# Patient Record
Sex: Male | Born: 1960 | Race: White | Hispanic: No | Marital: Married
Health system: Southern US, Academic
[De-identification: ages and names within clinical notes are randomized; demographics above are authoritative.]

## PROBLEM LIST (undated history)

## (undated) ENCOUNTER — Encounter

## (undated) ENCOUNTER — Telehealth

## (undated) ENCOUNTER — Encounter: Attending: Radiation Oncology | Primary: Radiation Oncology

## (undated) ENCOUNTER — Telehealth: Attending: Hematology & Oncology | Primary: Hematology & Oncology

## (undated) ENCOUNTER — Encounter: Attending: Adult Health | Primary: Adult Health

## (undated) ENCOUNTER — Telehealth: Attending: Family | Primary: Family

## (undated) ENCOUNTER — Ambulatory Visit

## (undated) ENCOUNTER — Ambulatory Visit: Payer: MEDICARE | Attending: Family | Primary: Family

## (undated) ENCOUNTER — Encounter: Attending: Hematology & Oncology | Primary: Hematology & Oncology

## (undated) ENCOUNTER — Ambulatory Visit: Payer: MEDICARE

## (undated) ENCOUNTER — Encounter: Attending: Family | Primary: Family

## (undated) ENCOUNTER — Ambulatory Visit: Attending: Radiation Oncology | Primary: Radiation Oncology

## (undated) ENCOUNTER — Telehealth
Attending: Student in an Organized Health Care Education/Training Program | Primary: Student in an Organized Health Care Education/Training Program

## (undated) ENCOUNTER — Encounter: Attending: "Oncology | Primary: "Oncology

## (undated) ENCOUNTER — Encounter: Attending: Physician Assistant | Primary: Physician Assistant

## (undated) ENCOUNTER — Ambulatory Visit: Payer: MEDICARE | Attending: Hematology & Oncology | Primary: Hematology & Oncology

## (undated) ENCOUNTER — Ambulatory Visit
Attending: Rehabilitative and Restorative Service Providers" | Primary: Rehabilitative and Restorative Service Providers"

## (undated) ENCOUNTER — Encounter: Attending: Speech-Language Pathologist | Primary: Speech-Language Pathologist

## (undated) ENCOUNTER — Ambulatory Visit: Payer: MEDICARE | Attending: Gastroenterology | Primary: Gastroenterology

## (undated) ENCOUNTER — Ambulatory Visit: Payer: MEDICARE | Attending: Physician Assistant | Primary: Physician Assistant

## (undated) ENCOUNTER — Encounter: Attending: Internal Medicine | Primary: Internal Medicine

## (undated) ENCOUNTER — Telehealth: Attending: Adult Health | Primary: Adult Health

## (undated) ENCOUNTER — Ambulatory Visit: Payer: MEDICARE | Attending: Internal Medicine | Primary: Internal Medicine

## (undated) ENCOUNTER — Institutional Professional Consult (permissible substitution): Payer: MEDICARE | Attending: Family | Primary: Family

## (undated) ENCOUNTER — Encounter: Payer: MEDICARE | Attending: Gastroenterology | Primary: Gastroenterology

## (undated) ENCOUNTER — Telehealth
Attending: Pharmacist Clinician (PhC)/ Clinical Pharmacy Specialist | Primary: Pharmacist Clinician (PhC)/ Clinical Pharmacy Specialist

## (undated) ENCOUNTER — Ambulatory Visit
Attending: Pharmacist Clinician (PhC)/ Clinical Pharmacy Specialist | Primary: Pharmacist Clinician (PhC)/ Clinical Pharmacy Specialist

## (undated) ENCOUNTER — Ambulatory Visit
Attending: Student in an Organized Health Care Education/Training Program | Primary: Student in an Organized Health Care Education/Training Program

## (undated) ENCOUNTER — Ambulatory Visit: Payer: MEDICARE | Attending: Radiation Oncology | Primary: Radiation Oncology

## (undated) ENCOUNTER — Telehealth: Attending: Physician Assistant | Primary: Physician Assistant

## (undated) ENCOUNTER — Encounter
Attending: Pharmacist Clinician (PhC)/ Clinical Pharmacy Specialist | Primary: Pharmacist Clinician (PhC)/ Clinical Pharmacy Specialist

## (undated) ENCOUNTER — Encounter
Attending: Student in an Organized Health Care Education/Training Program | Primary: Student in an Organized Health Care Education/Training Program

## (undated) ENCOUNTER — Encounter: Attending: Diagnostic Radiology | Primary: Diagnostic Radiology

## (undated) ENCOUNTER — Ambulatory Visit
Payer: MEDICARE | Attending: Student in an Organized Health Care Education/Training Program | Primary: Student in an Organized Health Care Education/Training Program

## (undated) ENCOUNTER — Telehealth: Attending: Geriatric Medicine | Primary: Geriatric Medicine

## (undated) ENCOUNTER — Ambulatory Visit: Payer: MEDICARE | Attending: Speech-Language Pathologist | Primary: Speech-Language Pathologist

## (undated) ENCOUNTER — Institutional Professional Consult (permissible substitution): Payer: MEDICARE

## (undated) DIAGNOSIS — F1021 Alcohol dependence, in remission: Secondary | ICD-10-CM

## (undated) DIAGNOSIS — N200 Calculus of kidney: Secondary | ICD-10-CM

## (undated) DIAGNOSIS — F419 Anxiety disorder, unspecified: Secondary | ICD-10-CM

## (undated) DIAGNOSIS — F32A Depression, unspecified: Secondary | ICD-10-CM

## (undated) DIAGNOSIS — C329 Malignant neoplasm of larynx, unspecified: Secondary | ICD-10-CM

## (undated) DIAGNOSIS — F329 Major depressive disorder, single episode, unspecified: Secondary | ICD-10-CM

## (undated) DIAGNOSIS — I10 Essential (primary) hypertension: Secondary | ICD-10-CM

## (undated) DIAGNOSIS — B1920 Unspecified viral hepatitis C without hepatic coma: Secondary | ICD-10-CM

## (undated) HISTORY — DX: Calculus of kidney: N20.0

## (undated) HISTORY — DX: Malignant neoplasm of larynx, unspecified: C32.9

## (undated) HISTORY — DX: Anxiety disorder, unspecified: F41.9

## (undated) HISTORY — DX: Major depressive disorder, single episode, unspecified: F32.9

## (undated) HISTORY — DX: Unspecified viral hepatitis C without hepatic coma: B19.20

## (undated) HISTORY — DX: Alcohol dependence, in remission: F10.21

## (undated) HISTORY — DX: Depression, unspecified: F32.A

## (undated) HISTORY — DX: Essential (primary) hypertension: I10

## (undated) MED ORDER — LISINOPRIL 10 MG TABLET: tablet | 0 refills | 0 days

## (undated) MED ORDER — OXYCODONE 10 MG TABLET: ORAL | 0.00000 days | PRN

---

## 1989-01-14 HISTORY — PX: SPINE SURGERY: SHX786

## 2017-04-05 ENCOUNTER — Encounter
Admit: 2017-04-05 | Discharge: 2017-04-11 | Disposition: A | Discharge status: Home Health | Payer: MEDICARE | Attending: Student in an Organized Health Care Education/Training Program

## 2017-04-05 ENCOUNTER — Ambulatory Visit: Admit: 2017-04-05 | Discharge: 2017-04-11 | Disposition: A | Discharge status: Home Health | Payer: MEDICARE

## 2017-04-05 DIAGNOSIS — R221 Localized swelling, mass and lump, neck: Principal | ICD-10-CM

## 2017-04-05 DIAGNOSIS — C329 Malignant neoplasm of larynx, unspecified: Secondary | ICD-10-CM

## 2017-04-05 HISTORY — DX: Malignant neoplasm of larynx, unspecified: C32.9

## 2017-04-06 DIAGNOSIS — R221 Localized swelling, mass and lump, neck: Principal | ICD-10-CM

## 2017-04-08 DIAGNOSIS — R221 Localized swelling, mass and lump, neck: Principal | ICD-10-CM

## 2017-04-11 MED ORDER — OXYCODONE 5 MG TABLET
ORAL_TABLET | ORAL | 0 refills | 0.00 days | Status: CP | PRN
Start: 2017-04-11 — End: 2017-04-14

## 2017-04-11 MED ORDER — PREGABALIN 50 MG CAPSULE: 50 mg | capsule | 0 refills | 0 days

## 2017-04-11 MED ORDER — DOCUSATE SODIUM 50 MG/5 ML ORAL LIQUID
Freq: Every day | ORAL | 0 refills | 0.00000 days | Status: SS | PRN
Start: 2017-04-11 — End: 2017-04-11

## 2017-04-11 MED ORDER — ACETAMINOPHEN 500 MG TABLET: 1000 mg | tablet | Freq: Four times a day (QID) | 2 refills | 0 days | Status: AC

## 2017-04-11 MED ORDER — DOCUSATE SODIUM 50 MG/5 ML ORAL LIQUID: 100 mg | mL | Freq: Every day | 0 refills | 0 days | Status: SS

## 2017-04-11 MED ORDER — ACETAMINOPHEN 500 MG TABLET
Freq: Four times a day (QID) | ORAL | 2 refills | 0.00000 days | Status: CP | PRN
Start: 2017-04-11 — End: 2017-04-11

## 2017-04-11 MED ORDER — PREGABALIN 50 MG CAPSULE
ORAL_CAPSULE | Freq: Two times a day (BID) | ORAL | 0 refills | 0.00000 days | Status: CP
Start: 2017-04-11 — End: 2017-04-11

## 2017-04-11 MED FILL — ACETAMINOPHEN/500MG/TABS: ACETAMINOPHEN/500MG/TABS | 12 days supply | Qty: 100 | Fill #0

## 2017-04-11 MED FILL — SILACE/10MG/ML/LIQD: SILACE/10MG/ML/LIQD | 47 days supply | Qty: 473 | Fill #0

## 2017-04-11 MED FILL — OXYCODONE HCL/5MG/TABS: OXYCODONE HCL/5MG/TABS | 2 days supply | Qty: 15 | Fill #0

## 2017-04-11 MED FILL — LYRICA/50MG/CAP: LYRICA/50MG/CAP | 4 days supply | Qty: 8 | Fill #0

## 2017-04-14 ENCOUNTER — Ambulatory Visit
Admit: 2017-04-14 | Discharge: 2017-05-13 | Payer: MEDICARE | Attending: Radiation Oncology | Primary: Radiation Oncology

## 2017-04-14 ENCOUNTER — Other Ambulatory Visit: Admit: 2017-04-14 | Discharge: 2017-05-13 | Payer: MEDICARE

## 2017-04-14 ENCOUNTER — Ambulatory Visit: Admit: 2017-04-14 | Discharge: 2017-05-13 | Payer: MEDICARE

## 2017-04-14 ENCOUNTER — Ambulatory Visit
Admit: 2017-04-14 | Discharge: 2017-05-13 | Payer: MEDICARE | Attending: Speech-Language Pathologist | Primary: Speech-Language Pathologist

## 2017-04-14 ENCOUNTER — Ambulatory Visit
Admit: 2017-04-14 | Discharge: 2017-05-13 | Payer: MEDICARE | Attending: Physician Assistant | Primary: Physician Assistant

## 2017-04-14 ENCOUNTER — Encounter: Admit: 2017-04-14 | Discharge: 2017-05-13 | Payer: MEDICARE | Attending: Adult Health | Primary: Adult Health

## 2017-04-14 ENCOUNTER — Ambulatory Visit
Admit: 2017-04-14 | Discharge: 2017-05-13 | Payer: MEDICARE | Attending: Hematology & Oncology | Primary: Hematology & Oncology

## 2017-04-14 ENCOUNTER — Ambulatory Visit: Admit: 2017-04-14 | Discharge: 2017-05-13 | Payer: MEDICARE | Attending: Registered" | Primary: Registered"

## 2017-04-14 ENCOUNTER — Ambulatory Visit: Admit: 2017-04-14 | Discharge: 2017-05-13 | Payer: MEDICARE | Attending: Family | Primary: Family

## 2017-04-14 DIAGNOSIS — C329 Malignant neoplasm of larynx, unspecified: Principal | ICD-10-CM

## 2017-04-14 DIAGNOSIS — G8929 Other chronic pain: Secondary | ICD-10-CM

## 2017-04-14 DIAGNOSIS — R911 Solitary pulmonary nodule: Secondary | ICD-10-CM

## 2017-04-14 MED ORDER — PREGABALIN 50 MG CAPSULE
ORAL_CAPSULE | Freq: Two times a day (BID) | ORAL | 3 refills | 0.00000 days | Status: CP
Start: 2017-04-14 — End: 2017-04-14

## 2017-04-14 MED ORDER — PREGABALIN 50 MG CAPSULE: 50 mg | capsule | 3 refills | 0 days

## 2017-04-14 MED ORDER — OXYCODONE 5 MG TABLET
ORAL_TABLET | Freq: Four times a day (QID) | ORAL | 0 refills | 0.00000 days | Status: CP | PRN
Start: 2017-04-14 — End: 2017-04-28

## 2017-04-14 MED FILL — OXYCODONE HCL/5MG/TABS: OXYCODONE HCL/5MG/TABS | 14 days supply | Qty: 56 | Fill #0

## 2017-04-15 DIAGNOSIS — C329 Malignant neoplasm of larynx, unspecified: Principal | ICD-10-CM

## 2017-04-23 DIAGNOSIS — C329 Malignant neoplasm of larynx, unspecified: Secondary | ICD-10-CM

## 2017-04-23 DIAGNOSIS — R1310 Dysphagia, unspecified: Principal | ICD-10-CM

## 2017-04-28 ENCOUNTER — Ambulatory Visit: Admit: 2017-04-28 | Discharge: 2017-04-29 | Attending: Radiation Oncology | Primary: Radiation Oncology

## 2017-04-28 DIAGNOSIS — G8929 Other chronic pain: Secondary | ICD-10-CM

## 2017-04-28 DIAGNOSIS — C329 Malignant neoplasm of larynx, unspecified: Principal | ICD-10-CM

## 2017-04-28 MED ORDER — OXYCODONE 5 MG TABLET
ORAL_TABLET | ORAL | 0 refills | 0 days | Status: CP | PRN
Start: 2017-04-28 — End: 2017-05-13

## 2017-04-29 ENCOUNTER — Ambulatory Visit: Admit: 2017-04-29 | Discharge: 2017-04-30 | Attending: Registered" | Primary: Registered"

## 2017-04-29 ENCOUNTER — Ambulatory Visit: Admit: 2017-04-29 | Discharge: 2017-04-30

## 2017-04-29 DIAGNOSIS — Z713 Dietary counseling and surveillance: Principal | ICD-10-CM

## 2017-04-29 DIAGNOSIS — F419 Anxiety disorder, unspecified: Secondary | ICD-10-CM

## 2017-04-29 DIAGNOSIS — Z006 Encounter for examination for normal comparison and control in clinical research program: Secondary | ICD-10-CM

## 2017-04-29 DIAGNOSIS — G893 Neoplasm related pain (acute) (chronic): Secondary | ICD-10-CM

## 2017-04-29 DIAGNOSIS — C329 Malignant neoplasm of larynx, unspecified: Principal | ICD-10-CM

## 2017-04-29 DIAGNOSIS — F329 Major depressive disorder, single episode, unspecified: Secondary | ICD-10-CM

## 2017-04-29 MED ORDER — GABAPENTIN 300 MG CAPSULE
ORAL_CAPSULE | Freq: Every evening | ORAL | 0 refills | 0.00000 days | Status: CP
Start: 2017-04-29 — End: 2017-05-22

## 2017-04-29 MED ORDER — ONDANSETRON HCL 8 MG TABLET
ORAL_TABLET | Freq: Three times a day (TID) | ORAL | 2 refills | 0.00000 days | Status: CP | PRN
Start: 2017-04-29 — End: 2017-05-26

## 2017-04-29 MED ORDER — PREGABALIN 50 MG CAPSULE: 50 mg | capsule | Freq: Two times a day (BID) | 1 refills | 0 days | Status: AC

## 2017-04-29 MED ORDER — PROCHLORPERAZINE MALEATE 10 MG TABLET
ORAL_TABLET | Freq: Four times a day (QID) | ORAL | 2 refills | 0.00000 days | Status: CP | PRN
Start: 2017-04-29 — End: 2017-04-29

## 2017-04-29 MED ORDER — (CLOSED) STUDY LCCC1430 AZD1775 100 MG CAPSULE
Freq: Two times a day (BID) | ORAL | 0 refills | 0 days | Status: CP
Start: 2017-04-29 — End: 2017-07-16

## 2017-04-29 MED ORDER — GABAPENTIN 300 MG CAPSULE: 300 mg | capsule | Freq: Every evening | 0 refills | 0 days | Status: AC

## 2017-04-29 MED ORDER — (CLOSED) STUDY LCCC1430 AZD1775 25 MG CAPSULE
Freq: Two times a day (BID) | ORAL | 0 refills | 0 days | Status: CP
Start: 2017-04-29 — End: 2017-07-16

## 2017-04-29 MED ORDER — PREGABALIN 50 MG CAPSULE
ORAL_CAPSULE | Freq: Two times a day (BID) | ORAL | 1 refills | 0.00000 days | Status: CP
Start: 2017-04-29 — End: 2017-04-29

## 2017-04-29 MED ORDER — ESCITALOPRAM 10 MG TABLET
ORAL_TABLET | Freq: Every day | ORAL | 2 refills | 0 days | Status: CP
Start: 2017-04-29 — End: 2017-05-20

## 2017-04-29 MED ORDER — CLONAZEPAM 0.5 MG TABLET: 1 mg | tablet | Freq: Two times a day (BID) | 0 refills | 0 days | Status: AC

## 2017-04-29 MED ORDER — CLONAZEPAM 0.5 MG TABLET
ORAL_TABLET | Freq: Two times a day (BID) | ORAL | 0 refills | 0.00000 days | Status: CP | PRN
Start: 2017-04-29 — End: 2017-04-29

## 2017-04-29 MED ORDER — PROCHLORPERAZINE MALEATE 10 MG TABLET: 10 mg | tablet | 2 refills | 0 days

## 2017-04-29 MED ORDER — ONDANSETRON HCL 8 MG TABLET: 8 mg | tablet | 2 refills | 0 days

## 2017-04-29 MED FILL — ONDANSETRON/8MG/TAB: ONDANSETRON/8MG/TAB | 10 days supply | Qty: 30 | Fill #0

## 2017-04-29 MED FILL — GABAPENTIN/300MG/CAPS: GABAPENTIN/300MG/CAPS | 30 days supply | Qty: 30 | Fill #0

## 2017-04-29 MED FILL — PROCHLORPERAZINE/10MG/TABS: PROCHLORPERAZINE/10MG/TABS | 7 days supply | Qty: 30 | Fill #0

## 2017-04-29 MED FILL — OXYCODONE HCL/5MG/TABS: OXYCODONE HCL/5MG/TABS | 10 days supply | Qty: 60 | Fill #0

## 2017-04-29 MED FILL — ESCITALOPRAM OXALATE/10MG/TABS: ESCITALOPRAM OXALATE/10MG/TABS | 30 days supply | Qty: 30 | Fill #0

## 2017-04-29 MED FILL — CLONAZEPAM/0.5MG/TABS: CLONAZEPAM/0.5MG/TABS | 30 days supply | Qty: 60 | Fill #0

## 2017-04-30 ENCOUNTER — Ambulatory Visit: Admit: 2017-04-30 | Discharge: 2017-05-01

## 2017-05-01 ENCOUNTER — Ambulatory Visit: Admit: 2017-05-01 | Discharge: 2017-05-02

## 2017-05-01 DIAGNOSIS — G8929 Other chronic pain: Secondary | ICD-10-CM

## 2017-05-01 DIAGNOSIS — R802 Orthostatic proteinuria, unspecified: Principal | ICD-10-CM

## 2017-05-01 DIAGNOSIS — C329 Malignant neoplasm of larynx, unspecified: Principal | ICD-10-CM

## 2017-05-01 DIAGNOSIS — R8290 Unspecified abnormal findings in urine: Secondary | ICD-10-CM

## 2017-05-02 ENCOUNTER — Ambulatory Visit: Admit: 2017-05-02 | Discharge: 2017-05-03

## 2017-05-02 DIAGNOSIS — C329 Malignant neoplasm of larynx, unspecified: Principal | ICD-10-CM

## 2017-05-05 ENCOUNTER — Ambulatory Visit: Admit: 2017-05-05 | Discharge: 2017-05-06

## 2017-05-05 DIAGNOSIS — Z006 Encounter for examination for normal comparison and control in clinical research program: Secondary | ICD-10-CM

## 2017-05-05 DIAGNOSIS — Z1159 Encounter for screening for other viral diseases: Secondary | ICD-10-CM

## 2017-05-05 DIAGNOSIS — C329 Malignant neoplasm of larynx, unspecified: Principal | ICD-10-CM

## 2017-05-05 DIAGNOSIS — R945 Abnormal results of liver function studies: Principal | ICD-10-CM

## 2017-05-05 DIAGNOSIS — I10 Essential (primary) hypertension: Secondary | ICD-10-CM

## 2017-05-05 DIAGNOSIS — Z713 Dietary counseling and surveillance: Principal | ICD-10-CM

## 2017-05-05 MED ORDER — LISINOPRIL 10 MG TABLET: 10 mg | tablet | Freq: Every day | 11 refills | 0 days | Status: AC

## 2017-05-05 MED ORDER — TAMSULOSIN 0.4 MG CAPSULE
ORAL_CAPSULE | Freq: Every day | ORAL | 8 refills | 0.00000 days | Status: CP
Start: 2017-05-05 — End: 2017-05-05

## 2017-05-05 MED ORDER — LISINOPRIL 10 MG TABLET
ORAL_TABLET | Freq: Every day | ORAL | 11 refills | 0.00000 days | Status: CP
Start: 2017-05-05 — End: 2017-05-05

## 2017-05-05 MED ORDER — TAMSULOSIN 0.4 MG CAPSULE: capsule | 8 refills | 0 days

## 2017-05-05 MED ORDER — TAMSULOSIN 0.4 MG CAPSULE: capsule | 6 refills | 0 days

## 2017-05-05 MED FILL — LISINOPRIL/10MG/TABS: LISINOPRIL/10MG/TABS | 30 days supply | Qty: 30 | Fill #0

## 2017-05-05 MED FILL — TAMSULOSIN/0.4MG/CAPS: TAMSULOSIN/0.4MG/CAPS | 30 days supply | Qty: 30 | Fill #0

## 2017-05-06 ENCOUNTER — Ambulatory Visit: Admit: 2017-05-06 | Discharge: 2017-05-07

## 2017-05-07 ENCOUNTER — Ambulatory Visit: Admit: 2017-05-07 | Discharge: 2017-05-08

## 2017-05-08 ENCOUNTER — Ambulatory Visit: Admit: 2017-05-08 | Discharge: 2017-05-09

## 2017-05-08 DIAGNOSIS — C329 Malignant neoplasm of larynx, unspecified: Principal | ICD-10-CM

## 2017-05-08 MED ORDER — RANITIDINE 150 MG TABLET
ORAL_TABLET | Freq: Every evening | ORAL | 1 refills | 0.00000 days | Status: CP
Start: 2017-05-08 — End: 2017-05-08

## 2017-05-08 MED ORDER — RANITIDINE 300 MG TABLET: 300 mg | tablet | 5 refills | 0 days

## 2017-05-08 MED ORDER — LOPERAMIDE 2 MG CAPSULE
ORAL_CAPSULE | 2 refills | 0.00000 days | Status: CP
Start: 2017-05-08 — End: 2017-07-16

## 2017-05-08 MED ORDER — LOPERAMIDE 2 MG CAPSULE: capsule | 2 refills | 0 days | Status: AC

## 2017-05-08 MED ORDER — RANITIDINE 150 MG TABLET: tablet | 1 refills | 0 days

## 2017-05-08 MED ORDER — RANITIDINE 300 MG TABLET
ORAL_TABLET | Freq: Every evening | ORAL | 5 refills | 0.00000 days | Status: CP
Start: 2017-05-08 — End: 2017-05-08

## 2017-05-09 ENCOUNTER — Ambulatory Visit: Admit: 2017-05-09 | Discharge: 2017-05-10

## 2017-05-12 ENCOUNTER — Ambulatory Visit: Admit: 2017-05-12 | Discharge: 2017-05-13

## 2017-05-12 DIAGNOSIS — C329 Malignant neoplasm of larynx, unspecified: Principal | ICD-10-CM

## 2017-05-12 MED ORDER — CLOTRIMAZOLE-BETAMETHASONE 1 %-0.05 % TOPICAL CREAM
1 refills | 0.00000 days | Status: CP
Start: 2017-05-12 — End: 2017-06-20

## 2017-05-12 MED ORDER — CLOTRIMAZOLE-BETAMETHASONE 1 %-0.05 % TOPICAL CREAM: g | 1 refills | 0 days | Status: AC

## 2017-05-13 ENCOUNTER — Ambulatory Visit: Admit: 2017-05-13 | Discharge: 2017-05-14

## 2017-05-13 DIAGNOSIS — C329 Malignant neoplasm of larynx, unspecified: Principal | ICD-10-CM

## 2017-05-13 MED ORDER — OXYCODONE 5 MG TABLET
ORAL_TABLET | ORAL | 0 refills | 0.00000 days | Status: CP | PRN
Start: 2017-05-13 — End: 2017-05-22

## 2017-05-13 MED FILL — OXYCODONE HCL/5MG/TABS: OXYCODONE HCL/5MG/TABS | 14 days supply | Qty: 84 | Fill #0

## 2017-05-14 ENCOUNTER — Ambulatory Visit
Admit: 2017-05-14 | Discharge: 2017-06-13 | Payer: MEDICARE | Attending: Radiation Oncology | Primary: Radiation Oncology

## 2017-05-14 ENCOUNTER — Other Ambulatory Visit: Admit: 2017-05-14 | Discharge: 2017-06-13 | Payer: MEDICARE

## 2017-05-14 ENCOUNTER — Ambulatory Visit: Admit: 2017-05-14 | Discharge: 2017-06-13 | Payer: MEDICARE

## 2017-05-14 ENCOUNTER — Ambulatory Visit
Admit: 2017-05-14 | Discharge: 2017-06-13 | Payer: MEDICARE | Attending: Physician Assistant | Primary: Physician Assistant

## 2017-05-14 ENCOUNTER — Ambulatory Visit
Admit: 2017-05-14 | Discharge: 2017-06-13 | Payer: MEDICARE | Attending: Speech-Language Pathologist | Primary: Speech-Language Pathologist

## 2017-05-14 ENCOUNTER — Ambulatory Visit
Admit: 2017-05-14 | Discharge: 2017-06-13 | Payer: MEDICARE | Attending: Hematology & Oncology | Primary: Hematology & Oncology

## 2017-05-14 ENCOUNTER — Ambulatory Visit: Admit: 2017-05-14 | Discharge: 2017-05-15

## 2017-05-14 ENCOUNTER — Ambulatory Visit: Admit: 2017-05-14 | Discharge: 2017-06-13 | Payer: MEDICARE | Attending: Registered" | Primary: Registered"

## 2017-05-14 DIAGNOSIS — R319 Hematuria, unspecified: Secondary | ICD-10-CM

## 2017-05-14 DIAGNOSIS — C321 Malignant neoplasm of supraglottis: Principal | ICD-10-CM

## 2017-05-14 DIAGNOSIS — C329 Malignant neoplasm of larynx, unspecified: Secondary | ICD-10-CM

## 2017-05-15 ENCOUNTER — Ambulatory Visit: Admit: 2017-05-15 | Discharge: 2017-05-16

## 2017-05-15 DIAGNOSIS — C329 Malignant neoplasm of larynx, unspecified: Principal | ICD-10-CM

## 2017-05-16 ENCOUNTER — Ambulatory Visit: Admit: 2017-05-16 | Discharge: 2017-05-17

## 2017-05-19 ENCOUNTER — Ambulatory Visit: Admit: 2017-05-19 | Discharge: 2017-05-20

## 2017-05-20 ENCOUNTER — Ambulatory Visit
Admit: 2017-05-20 | Discharge: 2017-05-20 | Payer: MEDICARE | Attending: Physician Assistant | Primary: Physician Assistant

## 2017-05-20 ENCOUNTER — Ambulatory Visit: Admit: 2017-05-20 | Discharge: 2017-05-20 | Payer: MEDICARE

## 2017-05-20 ENCOUNTER — Ambulatory Visit: Admit: 2017-05-20 | Discharge: 2017-05-21

## 2017-05-20 ENCOUNTER — Other Ambulatory Visit: Admit: 2017-05-20 | Discharge: 2017-05-20 | Payer: MEDICARE

## 2017-05-20 DIAGNOSIS — C329 Malignant neoplasm of larynx, unspecified: Principal | ICD-10-CM

## 2017-05-20 DIAGNOSIS — F1011 Alcohol abuse, in remission: Secondary | ICD-10-CM

## 2017-05-20 DIAGNOSIS — F418 Other specified anxiety disorders: Principal | ICD-10-CM

## 2017-05-20 DIAGNOSIS — T7492XA Unspecified child maltreatment, confirmed, initial encounter: Secondary | ICD-10-CM

## 2017-05-20 DIAGNOSIS — Z006 Encounter for examination for normal comparison and control in clinical research program: Secondary | ICD-10-CM

## 2017-05-20 MED ORDER — (CLOSED) STUDY LCCC1430 AZD1775 25 MG CAPSULE
Freq: Two times a day (BID) | ORAL | 0 refills | 0 days | Status: CP
Start: 2017-05-20 — End: 2017-07-16

## 2017-05-20 MED ORDER — ESCITALOPRAM 20 MG TABLET: 20 mg | tablet | 1 refills | 0 days

## 2017-05-20 MED ORDER — (CLOSED) STUDY LCCC1430 AZD1775 100 MG CAPSULE
Freq: Two times a day (BID) | ORAL | 0 refills | 0 days | Status: CP
Start: 2017-05-20 — End: 2017-07-16

## 2017-05-20 MED ORDER — ESCITALOPRAM 20 MG TABLET
ORAL_TABLET | Freq: Every day | ORAL | 2 refills | 0.00000 days | Status: CP
Start: 2017-05-20 — End: 2017-05-20

## 2017-05-21 ENCOUNTER — Ambulatory Visit: Admit: 2017-05-21 | Discharge: 2017-05-22

## 2017-05-22 ENCOUNTER — Ambulatory Visit: Admit: 2017-05-22 | Discharge: 2017-05-23

## 2017-05-22 DIAGNOSIS — C329 Malignant neoplasm of larynx, unspecified: Principal | ICD-10-CM

## 2017-05-22 MED ORDER — PROCHLORPERAZINE MALEATE 10 MG TABLET
ORAL_TABLET | Freq: Four times a day (QID) | ORAL | 1 refills | 0.00000 days | Status: CP | PRN
Start: 2017-05-22 — End: 2017-05-22

## 2017-05-22 MED ORDER — GABAPENTIN 300 MG CAPSULE: 300 mg | capsule | 1 refills | 0 days

## 2017-05-22 MED ORDER — OXYCODONE 5 MG TABLET: 5 mg | tablet | 0 refills | 0 days | Status: AC

## 2017-05-22 MED ORDER — PROCHLORPERAZINE MALEATE 10 MG TABLET: 10 mg | tablet | Freq: Four times a day (QID) | 2 refills | 0 days | Status: AC

## 2017-05-22 MED ORDER — GABAPENTIN 300 MG CAPSULE: 300 mg | capsule | Freq: Every evening | 2 refills | 0 days | Status: AC

## 2017-05-22 MED ORDER — GABAPENTIN 300 MG CAPSULE
ORAL_CAPSULE | Freq: Every evening | ORAL | 1 refills | 0.00000 days | Status: CP
Start: 2017-05-22 — End: 2017-05-22

## 2017-05-22 MED ORDER — OXYCODONE 5 MG TABLET
ORAL_TABLET | ORAL | 0 refills | 0.00000 days | Status: CP | PRN
Start: 2017-05-22 — End: 2017-06-05

## 2017-05-23 ENCOUNTER — Ambulatory Visit: Admit: 2017-05-23 | Discharge: 2017-05-24 | Payer: MEDICARE

## 2017-05-23 ENCOUNTER — Ambulatory Visit: Admit: 2017-05-23 | Discharge: 2017-05-24

## 2017-05-23 DIAGNOSIS — C329 Malignant neoplasm of larynx, unspecified: Principal | ICD-10-CM

## 2017-05-23 MED FILL — PROCHLORPERAZINE/10MG/TABS: PROCHLORPERAZINE/10MG/TABS | 7 days supply | Qty: 30 | Fill #0

## 2017-05-23 MED FILL — GABAPENTIN/300MG/CAPS: GABAPENTIN/300MG/CAPS | 30 days supply | Qty: 30 | Fill #0

## 2017-05-23 MED FILL — ESCITALOPRAM 20MG/20MG/TABS: ESCITALOPRAM 20MG/20MG/TABS | 30 days supply | Qty: 30 | Fill #0

## 2017-05-26 DIAGNOSIS — Z713 Dietary counseling and surveillance: Principal | ICD-10-CM

## 2017-05-26 DIAGNOSIS — C329 Malignant neoplasm of larynx, unspecified: Principal | ICD-10-CM

## 2017-05-26 DIAGNOSIS — C321 Malignant neoplasm of supraglottis: Principal | ICD-10-CM

## 2017-05-26 DIAGNOSIS — R1310 Dysphagia, unspecified: Secondary | ICD-10-CM

## 2017-05-26 DIAGNOSIS — Z006 Encounter for examination for normal comparison and control in clinical research program: Secondary | ICD-10-CM

## 2017-05-26 DIAGNOSIS — B182 Chronic viral hepatitis C: Principal | ICD-10-CM

## 2017-05-26 DIAGNOSIS — R319 Hematuria, unspecified: Secondary | ICD-10-CM

## 2017-05-26 MED ORDER — ONDANSETRON HCL 8 MG TABLET
ORAL_TABLET | Freq: Three times a day (TID) | ORAL | 2 refills | 0.00000 days | Status: CP | PRN
Start: 2017-05-26 — End: 2017-07-02

## 2017-05-26 MED ORDER — LISINOPRIL 10 MG TABLET
ORAL_TABLET | Freq: Every day | ORAL | 11 refills | 0.00000 days | Status: CP
Start: 2017-05-26 — End: 2017-05-26

## 2017-05-26 MED ORDER — LISINOPRIL 10 MG TABLET: 10 mg | tablet | Freq: Every day | 11 refills | 0 days | Status: AC

## 2017-05-26 MED FILL — ONDANSETRON/8MG/TAB: ONDANSETRON/8MG/TAB | 10 days supply | Qty: 30 | Fill #0

## 2017-05-26 MED FILL — OXYCODONE HCL/5MG/TABS: OXYCODONE HCL/5MG/TABS | 14 days supply | Qty: 84 | Fill #0

## 2017-05-27 ENCOUNTER — Ambulatory Visit: Admit: 2017-05-27 | Discharge: 2017-05-28

## 2017-05-28 ENCOUNTER — Ambulatory Visit: Admit: 2017-05-28 | Discharge: 2017-05-29

## 2017-05-28 MED FILL — TAMSULOSIN/0.4MG/CAP: TAMSULOSIN/0.4MG/CAP | 90 days supply | Qty: 90 | Fill #0

## 2017-05-29 ENCOUNTER — Ambulatory Visit: Admit: 2017-05-29 | Discharge: 2017-05-30

## 2017-05-29 DIAGNOSIS — C329 Malignant neoplasm of larynx, unspecified: Principal | ICD-10-CM

## 2017-05-30 ENCOUNTER — Ambulatory Visit: Admit: 2017-05-30 | Discharge: 2017-05-31

## 2017-06-02 ENCOUNTER — Ambulatory Visit: Admit: 2017-06-02 | Discharge: 2017-06-03

## 2017-06-02 DIAGNOSIS — R319 Hematuria, unspecified: Secondary | ICD-10-CM

## 2017-06-02 DIAGNOSIS — C329 Malignant neoplasm of larynx, unspecified: Principal | ICD-10-CM

## 2017-06-02 DIAGNOSIS — N200 Calculus of kidney: Principal | ICD-10-CM

## 2017-06-02 DIAGNOSIS — C760 Malignant neoplasm of head, face and neck: Secondary | ICD-10-CM

## 2017-06-02 MED ORDER — TAMSULOSIN 0.4 MG CAPSULE: 0 mg | capsule | Freq: Every day | 11 refills | 0 days | Status: AC

## 2017-06-02 MED ORDER — TAMSULOSIN 0.4 MG CAPSULE
ORAL_CAPSULE | Freq: Every day | ORAL | 11 refills | 0.00000 days | Status: CP
Start: 2017-06-02 — End: 2017-06-02

## 2017-06-03 ENCOUNTER — Ambulatory Visit: Admit: 2017-06-03 | Discharge: 2017-06-04

## 2017-06-04 ENCOUNTER — Ambulatory Visit: Admit: 2017-06-04 | Discharge: 2017-06-05

## 2017-06-05 ENCOUNTER — Ambulatory Visit: Admit: 2017-06-05 | Discharge: 2017-06-06

## 2017-06-05 DIAGNOSIS — C329 Malignant neoplasm of larynx, unspecified: Principal | ICD-10-CM

## 2017-06-05 MED ORDER — OXYCODONE 5 MG TABLET
ORAL_TABLET | ORAL | 0 refills | 0.00000 days | Status: CP | PRN
Start: 2017-06-05 — End: 2017-06-13

## 2017-06-05 MED FILL — OXYCODONE HCL/5MG/TABS: OXYCODONE HCL/5MG/TABS | 7 days supply | Qty: 90 | Fill #0

## 2017-06-06 ENCOUNTER — Ambulatory Visit: Admit: 2017-06-06 | Discharge: 2017-06-07

## 2017-06-06 ENCOUNTER — Ambulatory Visit (INDEPENDENT_AMBULATORY_CARE_PROVIDER_SITE_OTHER): Payer: Self-pay | Admitting: Nurse Practitioner

## 2017-06-06 ENCOUNTER — Encounter: Payer: Self-pay | Admitting: Nurse Practitioner

## 2017-06-06 VITALS — BP 102/50 | HR 68 | Resp 14 | Ht 63.0 in | Wt 149.6 lb

## 2017-06-06 DIAGNOSIS — C329 Malignant neoplasm of larynx, unspecified: Principal | ICD-10-CM

## 2017-06-06 DIAGNOSIS — Z7689 Persons encountering health services in other specified circumstances: Secondary | ICD-10-CM

## 2017-06-06 DIAGNOSIS — R531 Weakness: Secondary | ICD-10-CM

## 2017-06-06 DIAGNOSIS — B182 Chronic viral hepatitis C: Secondary | ICD-10-CM

## 2017-06-06 NOTE — Progress Notes (Signed)
Subjective:    Patient ID: Ronald Murphy, male    DOB: 03-25-1960, 57 y.o.   MRN: [id number]  Ronald Murphy is a 57 y.o. male presenting on 06/06/2017 for [facility] Provider Pt last seen by PCP many years ago.  Obtain records from [place] for his hospitalization for neck mass on 04/05/2017 when he was diagnosed with Larynx cancer.  He has not had a PCP since diagnosis and is here to establish for non-cancer care needs.    Patient has history of alcohol use.  He drank 12-15 per day alcohol - quit about 4 years ago. Marijuana in past was at least once weekly until about 4 years ago.    Larynx Cancer Patient is well [facility] with [facility] for ongoing cancer care.  He is undergoing radiation and chemo.  Weakness Patient with generalized weakness and history of passing out.   He has had no recent syncopal events at home. It is becoming increasingly difficult to swallow/eat and has had feeding tube placed for nutrition.  Hepatitis C Patient with positive hep C without treatment to date.  Undergoing chemo and radiation.  Now is not time for active treatment.  Will provide referral in future as needed.   Past Medical History:  Diagnosis Date  . Alcoholism in remission ([place])   . Anxiety   . Depression   . Hepatitis C   . Hypertension   . Kidney stone   . 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 [place]     Social History   Socioeconomic History  . Marital status: Married    Spouse name: Not on file  . Number of children: Not on file  . Years of education: Not on file  . Highest education level: Not on file  Occupational History  . Not on file  Social Needs  . Financial resource strain: Not on file  . Food insecurity:    Worry: Not on file    Inability: Not on file  . Transportation needs:    Medical: Not on file    Non-medical: Not on file  Tobacco Use  .  Smoking status: Former Smoker    Last attempt to quit: 2015    Years since quitting: 4.6  . Smokeless tobacco: Never Used  Substance and Sexual Activity  . Alcohol use: Not Currently    Comment: quit 2015, prior use 12-15 beverages / day  . Drug use: Not Currently    Types: Marijuana  . Sexual activity: Not Currently  Lifestyle  . Physical activity:    Days per week: Not on file    Minutes per session: Not on file  . Stress: Not on file  Relationships  . Social connections:    Talks on phone: Not on file    Gets together: Not on file    Attends religious service: Not on file    Active member of club or organization: Not on file    Attends meetings of clubs or organizations: Not on file    Relationship status: Not on file  . Intimate partner violence:    Fear of current or ex partner: Not on file    Emotionally abused: Not on file    Physically abused: Not on file    Forced sexual activity: Not on file  Other Topics Concern  . Not on file  Social History  Narrative  . Not on file   Family History  Adopted: Yes   Current Outpatient Medications on File Prior to Visit  Medication Sig  . clonazePAM (KLONOPIN) 0.5 MG tablet Take 0.5 mg by mouth 2 (two) times daily as needed for anxiety.  Marland Kitchen escitalopram (LEXAPRO) 20 MG tablet Take 20 mg by mouth daily.  Marland Kitchen gabapentin (NEURONTIN) 300 MG capsule Take 300 mg by mouth at bedtime.  . ondansetron (ZOFRAN) 8 MG tablet Take by mouth every 8 (eight) hours as needed for nausea or vomiting.  Marland Kitchen oxycodone (OXY-IR) 5 MG capsule Take 5 mg by mouth every 4 (four) hours as needed.  . prochlorperazine (COMPAZINE) 10 MG tablet Take 10 mg by mouth every 6 (six) hours as needed for nausea or vomiting.  . tamsulosin (FLOMAX) 0.4 MG CAPS capsule Take 0.4 mg by mouth daily.   No current facility-administered medications on file prior to visit.     Review of Systems  Constitutional: Positive for fatigue and unexpected weight change (weight loss).    HENT: Positive for voice change.   Gastrointestinal:       Decreased appetite, difficulty swallowing   Per HPI unless specifically indicated above      Objective:    BP (!) 102/50 (BP Location: Right Arm, Patient Position: Sitting, Cuff Size: Normal)   Pulse 68   Resp 14   Ht 5\' 3"  (1.6 m)   Wt 149 lb 9.6 oz (67.9 kg)   BMI 26.50 kg/m   Wt Readings from Last 3 Encounters:  06/06/17 149 lb 9.6 oz (67.9 kg)    Physical Exam  Constitutional: He is oriented to person, place, and time. He appears well-developed. He appears cachectic. No distress.  HENT:  Head: Normocephalic and atraumatic.  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  Murmur heard. Pulmonary/Chest: Effort normal and breath sounds normal. No respiratory distress.  Abdominal: Soft. Bowel sounds are normal. There is hepatomegaly.  Neurological: He is alert and oriented to person, place, and time.  Skin: Skin is warm and dry. Capillary refill takes less than 2 seconds.  Jaundiced appearance  Psychiatric: He has a normal mood and affect. His behavior is normal. Judgment and thought content normal.  Vitals reviewed.    No results found for this or any previous visit.    Assessment & Plan:   Problem List Items Addressed This Visit      Respiratory   Larynx cancer ([place])    Initial diagnosis 04/05/2017.  Patient continues cancer care at [facility] on chemo and radiation.  Continue care at [facility].      Relevant Medications   ondansetron (ZOFRAN) 8 MG tablet   prochlorperazine (COMPAZINE) 10 MG tablet     Digestive   Chronic hepatitis C without hepatic coma [facility])    Diagnosis May 2019.  Patient has had referral to hepatologist.  Followup prn here.         Other Visit Diagnoses    Encounter to establish care    -  Primary   Generalized weakness        # Establish Care: Previous PCP was none.  Records are reviewed in  [facility] for cancer treatment at [facility].  Past medical, family, and surgical history reviewed w/ patient in clinic today.   # Generalized weakness: Patient with weakness associated with decreased oral intake after chemo and radiation causing radiation burns in throat.  Patient  s/p G-tube placement.   - Continue adequate feeding regimen - Encourage patient to stay as active as able to prevent muscle wasting. - Continue followup with oncology to guide nutritional recommendations. - Followup 1 month   Follow up plan: Return in about 1 month (around 07/04/2017) for hep C, heart murmur, deconditioning.  Cassell Smiles, DNP, AGPCNP-BC Adult Gerontology Primary Care Nurse Practitioner [facility] 06/07/2017, 7:52 AM

## 2017-06-06 NOTE — Patient Instructions (Signed)
Jerrol Helmers,   Thank you for coming in to clinic today.  1. For cancer care: continue as already planned.  2. For hep C: will need referral to GI/hepatology Dr. Anderson Malta at [place] with [place] options for [facility]  3. For heart murmur: Watch for passing out, weakness, shortness of breath, more with exertion than in past.  Please schedule a follow-up appointment with Cassell Smiles, AGNP. Return in about 1 month (around 07/04/2017) for hep C, heart murmur, deconditioning.  If you have any other questions or concerns, please feel free to call the clinic or send a message through [place]. You may also schedule an earlier appointment if necessary.  You will receive a survey after today's visit either digitally by e-mail or paper by [employer]. Your experiences and feedback matter to [place].  Please respond so we know how we are doing as we provide care for you.   Cassell Smiles, DNP, AGNP-BC Adult Gerontology Nurse Practitioner [place]

## 2017-06-08 MED FILL — ONDANSETRON/8MG/TABS: ONDANSETRON/8MG/TABS | 10 days supply | Qty: 30 | Fill #0

## 2017-06-08 MED FILL — PROCHLORPERAZINE/10MG/TABS: PROCHLORPERAZINE/10MG/TABS | 7 days supply | Qty: 30 | Fill #0

## 2017-06-10 ENCOUNTER — Ambulatory Visit
Admit: 2017-06-10 | Discharge: 2017-06-13 | Disposition: A | Discharge status: Home Health | Payer: MEDICARE | Admitting: Internal Medicine

## 2017-06-10 ENCOUNTER — Other Ambulatory Visit
Admit: 2017-06-10 | Discharge: 2017-06-13 | Disposition: A | Discharge status: Home Health | Payer: MEDICARE | Admitting: Internal Medicine

## 2017-06-10 ENCOUNTER — Ambulatory Visit
Admit: 2017-06-10 | Discharge: 2017-06-13 | Disposition: A | Discharge status: Home Health | Payer: MEDICARE | Attending: Hematology & Oncology | Admitting: Internal Medicine

## 2017-06-10 ENCOUNTER — Ambulatory Visit: Admit: 2017-06-10 | Discharge: 2017-06-11

## 2017-06-10 DIAGNOSIS — C329 Malignant neoplasm of larynx, unspecified: Principal | ICD-10-CM

## 2017-06-10 DIAGNOSIS — R5081 Fever presenting with conditions classified elsewhere: Secondary | ICD-10-CM

## 2017-06-10 DIAGNOSIS — R509 Fever, unspecified: Principal | ICD-10-CM

## 2017-06-10 DIAGNOSIS — F418 Other specified anxiety disorders: Principal | ICD-10-CM

## 2017-06-10 DIAGNOSIS — D709 Neutropenia, unspecified: Principal | ICD-10-CM

## 2017-06-10 DIAGNOSIS — A419 Sepsis, unspecified organism: Principal | ICD-10-CM

## 2017-06-12 ENCOUNTER — Ambulatory Visit: Admit: 2017-06-12 | Discharge: 2017-06-13

## 2017-06-12 DIAGNOSIS — A419 Sepsis, unspecified organism: Principal | ICD-10-CM

## 2017-06-12 DIAGNOSIS — C329 Malignant neoplasm of larynx, unspecified: Principal | ICD-10-CM

## 2017-06-13 ENCOUNTER — Ambulatory Visit: Admit: 2017-06-13 | Discharge: 2017-06-14

## 2017-06-13 MED ORDER — CLONAZEPAM 0.5 MG TABLET
ORAL_TABLET | Freq: Two times a day (BID) | GASTROSTOMY | 0 refills | 0.00000 days | Status: CP | PRN
Start: 2017-06-13 — End: 2017-06-20

## 2017-06-13 MED ORDER — CLONAZEPAM 0.5 MG TABLET: 1 mg | tablet | Freq: Two times a day (BID) | 0 refills | 0 days | Status: AC

## 2017-06-13 MED ORDER — GABAPENTIN 300 MG CAPSULE
ORAL_CAPSULE | Freq: Every evening | GASTROSTOMY | 0 refills | 0.00000 days | Status: CP
Start: 2017-06-13 — End: 2017-10-07

## 2017-06-13 MED ORDER — DOCUSATE SODIUM 50 MG/5 ML ORAL LIQUID
Freq: Every day | ORAL | 0 refills | 0.00000 days | Status: CP | PRN
Start: 2017-06-13 — End: 2017-06-13

## 2017-06-13 MED ORDER — GABAPENTIN 300 MG CAPSULE: capsule | 0 refills | 0 days

## 2017-06-13 MED ORDER — OXYCODONE 5 MG/5 ML ORAL SOLUTION
GASTROSTOMY | 0 refills | 0.00000 days | Status: CP | PRN
Start: 2017-06-13 — End: 2017-06-16

## 2017-06-13 MED ORDER — AMOXICILLIN 875 MG-POTASSIUM CLAVULANATE 125 MG TABLET: 1 | tablet | 0 refills | 0 days

## 2017-06-13 MED ORDER — FAMOTIDINE 20 MG TABLET: 20 mg | tablet | Freq: Two times a day (BID) | 0 refills | 0 days | Status: AC

## 2017-06-13 MED ORDER — DOCUSATE SODIUM 50 MG/5 ML ORAL LIQUID: mL | 0 refills | 0 days

## 2017-06-13 MED ORDER — AMOXICILLIN 875 MG-POTASSIUM CLAVULANATE 125 MG TABLET
ORAL_TABLET | Freq: Two times a day (BID) | GASTROSTOMY | 0 refills | 0.00000 days | Status: CP
Start: 2017-06-13 — End: 2017-06-13

## 2017-06-13 MED ORDER — FAMOTIDINE 20 MG TABLET
ORAL_TABLET | Freq: Two times a day (BID) | GASTROSTOMY | 0 refills | 0.00000 days | Status: CP
Start: 2017-06-13 — End: 2017-07-07

## 2017-06-13 MED FILL — OXYCODONE/5MG/5ML/SOLN: OXYCODONE/5MG/5ML/SOLN | 5 days supply | Qty: 150 | Fill #0

## 2017-06-13 MED FILL — AMOXICILLIN/CLAVULANATE P/875/125MG/TABS: AMOXICILLIN/CLAVULANATE P/875/125MG/TABS | 5 days supply | Qty: 10 | Fill #0

## 2017-06-13 MED FILL — FAMOTIDINE/20MG/TABS: FAMOTIDINE/20MG/TABS | 30 days supply | Qty: 60 | Fill #0

## 2017-06-13 MED FILL — CLONAZEPAM/0.5MG/TABS: CLONAZEPAM/0.5MG/TABS | 5 days supply | Qty: 10 | Fill #0

## 2017-06-16 ENCOUNTER — Ambulatory Visit: Admit: 2017-06-16 | Discharge: 2017-07-13 | Payer: MEDICARE

## 2017-06-16 ENCOUNTER — Ambulatory Visit: Admit: 2017-06-16 | Discharge: 2017-06-17

## 2017-06-16 ENCOUNTER — Ambulatory Visit
Admit: 2017-06-16 | Discharge: 2017-07-13 | Payer: MEDICARE | Attending: Radiation Oncology | Primary: Radiation Oncology

## 2017-06-16 ENCOUNTER — Ambulatory Visit: Admit: 2017-06-16 | Discharge: 2017-07-13 | Payer: MEDICARE | Attending: Adult Health | Primary: Adult Health

## 2017-06-16 DIAGNOSIS — C329 Malignant neoplasm of larynx, unspecified: Principal | ICD-10-CM

## 2017-06-16 DIAGNOSIS — G8929 Other chronic pain: Secondary | ICD-10-CM

## 2017-06-16 MED ORDER — OXYCODONE 5 MG/5 ML ORAL SOLUTION
GASTROSTOMY | 0 refills | 0.00000 days | Status: CP | PRN
Start: 2017-06-16 — End: 2017-06-24

## 2017-06-17 ENCOUNTER — Ambulatory Visit: Admit: 2017-06-17 | Discharge: 2017-06-18

## 2017-06-17 MED FILL — ONDANSETRON/8MG/TABS: ONDANSETRON/8MG/TABS | 10 days supply | Qty: 30 | Fill #1

## 2017-06-17 MED FILL — PROCHLORPERAZINE/10MG/TABS: PROCHLORPERAZINE/10MG/TABS | 7 days supply | Qty: 30 | Fill #1

## 2017-06-18 MED FILL — OXYCODONE/5MG/5ML/SOLN: OXYCODONE/5MG/5ML/SOLN | 8 days supply | Qty: 250 | Fill #0

## 2017-06-19 MED FILL — ESCITALOPRAM OXALATE/20MG/TABS: ESCITALOPRAM OXALATE/20MG/TABS | 30 days supply | Qty: 30 | Fill #0

## 2017-06-20 ENCOUNTER — Ambulatory Visit: Admit: 2017-06-20 | Discharge: 2017-06-21 | Payer: MEDICARE

## 2017-06-20 DIAGNOSIS — C329 Malignant neoplasm of larynx, unspecified: Principal | ICD-10-CM

## 2017-06-20 DIAGNOSIS — Z930 Tracheostomy status: Secondary | ICD-10-CM

## 2017-06-20 MED ORDER — CLONAZEPAM 0.5 MG TABLET
ORAL_TABLET | Freq: Two times a day (BID) | GASTROSTOMY | 0 refills | 0.00000 days | Status: CP | PRN
Start: 2017-06-20 — End: 2017-06-20

## 2017-06-20 MED ORDER — CLONAZEPAM 0.5 MG TABLET: tablet | 0 refills | 0 days

## 2017-06-20 MED FILL — CLONAZEPAM/0.5MG/TABS: CLONAZEPAM/0.5MG/TABS | 30 days supply | Qty: 60 | Fill #0

## 2017-06-24 MED ORDER — OXYCODONE 5 MG/5 ML ORAL SOLUTION
GASTROSTOMY | 0 refills | 0 days | Status: CP | PRN
Start: 2017-06-24 — End: 2017-07-02

## 2017-06-24 MED FILL — OXYCODONE/5MG/5ML/SOLN: OXYCODONE/5MG/5ML/SOLN | 16 days supply | Qty: 500 | Fill #0

## 2017-06-25 MED FILL — GABAPENTIN/300MG/CAPS: GABAPENTIN/300MG/CAPS | 90 days supply | Qty: 90 | Fill #0

## 2017-07-02 DIAGNOSIS — C101 Malignant neoplasm of anterior surface of epiglottis: Secondary | ICD-10-CM

## 2017-07-02 DIAGNOSIS — C329 Malignant neoplasm of larynx, unspecified: Principal | ICD-10-CM

## 2017-07-02 MED ORDER — OXYCODONE 5 MG/5 ML ORAL SOLUTION
GASTROSTOMY | 0 refills | 0.00000 days | Status: CP | PRN
Start: 2017-07-02 — End: 2017-07-16

## 2017-07-02 MED ORDER — ONDANSETRON HCL 8 MG TABLET
ORAL_TABLET | Freq: Three times a day (TID) | ORAL | 2 refills | 0 days | Status: CP | PRN
Start: 2017-07-02 — End: 2017-07-16

## 2017-07-02 MED FILL — ONDANSETRON/8MG/TAB: ONDANSETRON/8MG/TAB | 10 days supply | Qty: 30 | Fill #0

## 2017-07-07 MED ORDER — PROCHLORPERAZINE MALEATE 10 MG TABLET
ORAL_TABLET | Freq: Four times a day (QID) | ORAL | 2 refills | 0.00000 days | Status: CP | PRN
Start: 2017-07-07 — End: 2017-07-07

## 2017-07-07 MED ORDER — FAMOTIDINE 20 MG TABLET: 20 mg | each | 2 refills | 0 days

## 2017-07-07 MED ORDER — FAMOTIDINE 20 MG TABLET: 20 mg | tablet | Freq: Two times a day (BID) | 2 refills | 0 days | Status: AC

## 2017-07-07 MED ORDER — PROCHLORPERAZINE MALEATE 10 MG TABLET: 10 mg | tablet | Freq: Four times a day (QID) | 2 refills | 0 days | Status: AC

## 2017-07-07 MED ORDER — FAMOTIDINE 20 MG TABLET
Freq: Two times a day (BID) | GASTROSTOMY | 1 refills | 0.00000 days | Status: CP
Start: 2017-07-07 — End: 2017-11-19

## 2017-07-07 MED FILL — OXYCODONE/5MG/5ML/SOLN: OXYCODONE/5MG/5ML/SOLN | 16 days supply | Qty: 500 | Fill #0

## 2017-07-08 ENCOUNTER — Ambulatory Visit
Admit: 2017-07-08 | Discharge: 2017-07-09 | Payer: MEDICARE | Attending: Hematology & Oncology | Primary: Hematology & Oncology

## 2017-07-08 ENCOUNTER — Other Ambulatory Visit: Admit: 2017-07-08 | Discharge: 2017-07-09 | Payer: MEDICARE

## 2017-07-08 DIAGNOSIS — C329 Malignant neoplasm of larynx, unspecified: Principal | ICD-10-CM

## 2017-07-08 DIAGNOSIS — A419 Sepsis, unspecified organism: Principal | ICD-10-CM

## 2017-07-08 MED FILL — FAMOTIDINE/20MG/TABS: FAMOTIDINE/20MG/TABS | 30 days supply | Qty: 60 | Fill #0

## 2017-07-08 MED FILL — PROCHLORPERAZINE/10MG/TABS: PROCHLORPERAZINE/10MG/TABS | 7 days supply | Qty: 30 | Fill #0

## 2017-07-10 ENCOUNTER — Ambulatory Visit: Payer: Self-pay | Admitting: Nurse Practitioner

## 2017-07-14 ENCOUNTER — Ambulatory Visit: Admit: 2017-07-14 | Discharge: 2017-07-15 | Payer: MEDICARE

## 2017-07-14 ENCOUNTER — Ambulatory Visit
Admit: 2017-07-14 | Discharge: 2017-07-15 | Payer: MEDICARE | Attending: Diagnostic Radiology | Primary: Diagnostic Radiology

## 2017-07-14 ENCOUNTER — Ambulatory Visit
Admit: 2017-07-14 | Discharge: 2017-07-15 | Payer: MEDICARE | Attending: Hematology & Oncology | Primary: Hematology & Oncology

## 2017-07-14 DIAGNOSIS — C069 Malignant neoplasm of mouth, unspecified: Principal | ICD-10-CM

## 2017-07-14 DIAGNOSIS — Z931 Gastrostomy status: Principal | ICD-10-CM

## 2017-07-16 ENCOUNTER — Ambulatory Visit
Admit: 2017-07-16 | Discharge: 2017-08-13 | Payer: MEDICARE | Attending: Speech-Language Pathologist | Primary: Speech-Language Pathologist

## 2017-07-16 ENCOUNTER — Ambulatory Visit: Admit: 2017-07-16 | Discharge: 2017-08-13 | Payer: MEDICARE

## 2017-07-16 ENCOUNTER — Ambulatory Visit: Admit: 2017-07-16 | Discharge: 2017-08-13 | Payer: MEDICARE | Attending: Adult Health | Primary: Adult Health

## 2017-07-16 DIAGNOSIS — C329 Malignant neoplasm of larynx, unspecified: Secondary | ICD-10-CM

## 2017-07-16 DIAGNOSIS — R1310 Dysphagia, unspecified: Principal | ICD-10-CM

## 2017-07-16 MED ORDER — FLUCONAZOLE 10 MG/ML ORAL SUSPENSION: 100 mg | mL | Freq: Every day | 0 refills | 0 days | Status: AC

## 2017-07-16 MED ORDER — FLUCONAZOLE 10 MG/ML ORAL SUSPENSION
Freq: Every day | ORAL | 0 refills | 0.00000 days | Status: CP
Start: 2017-07-16 — End: 2017-08-28

## 2017-07-16 MED ORDER — OXYCODONE 5 MG/5 ML ORAL SOLUTION
GASTROSTOMY | 0 refills | 0.00000 days | Status: CP | PRN
Start: 2017-07-16 — End: 2017-07-21

## 2017-07-16 MED FILL — FLUCONAZOLE/10MG/ML/SUSR: FLUCONAZOLE/10MG/ML/SUSR | 14 days supply | Qty: 5 | Fill #0

## 2017-07-18 MED FILL — ESCITALOPRAM OXALATE/20MG/TABS: ESCITALOPRAM OXALATE/20MG/TABS | 30 days supply | Qty: 30 | Fill #1

## 2017-07-18 MED FILL — ONDANSETRON/8MG/TABS: ONDANSETRON/8MG/TABS | 10 days supply | Qty: 30 | Fill #0

## 2017-07-21 MED FILL — OXYCODONE/5MG/5ML/SOLN: OXYCODONE/5MG/5ML/SOLN | 16 days supply | Qty: 500 | Fill #0

## 2017-08-03 MED FILL — ONDANSETRON/8MG/TABS: ONDANSETRON/8MG/TABS | 10 days supply | Qty: 30 | Fill #1

## 2017-08-03 MED FILL — FAMOTIDINE/20MG/TABS: FAMOTIDINE/20MG/TABS | 60 days supply | Qty: 120 | Fill #0

## 2017-08-04 ENCOUNTER — Ambulatory Visit: Admit: 2017-08-04 | Discharge: 2017-08-04 | Payer: MEDICARE

## 2017-08-04 ENCOUNTER — Ambulatory Visit
Admit: 2017-08-04 | Discharge: 2017-08-04 | Payer: MEDICARE | Attending: Hematology & Oncology | Primary: Hematology & Oncology

## 2017-08-04 DIAGNOSIS — C329 Malignant neoplasm of larynx, unspecified: Principal | ICD-10-CM

## 2017-08-04 MED ORDER — CLONAZEPAM 0.5 MG TABLET: 0 mg | tablet | Freq: Two times a day (BID) | 0 refills | 0 days | Status: AC

## 2017-08-04 MED ORDER — CLONAZEPAM 0.5 MG TABLET
ORAL_TABLET | Freq: Two times a day (BID) | GASTROSTOMY | 0 refills | 0.00000 days | Status: CP | PRN
Start: 2017-08-04 — End: 2017-08-04

## 2017-08-04 MED ORDER — OXYCODONE 5 MG TABLET
ORAL_TABLET | 0 refills | 0 days | Status: CP
Start: 2017-08-04 — End: 2017-08-22

## 2017-08-04 MED FILL — CLONAZEPAM/0.5MG/TABS: CLONAZEPAM/0.5MG/TABS | 30 days supply | Qty: 30 | Fill #0

## 2017-08-04 MED FILL — OXYCODONE HCL/5MG/TABS: OXYCODONE HCL/5MG/TABS | 15 days supply | Qty: 30 | Fill #0

## 2017-08-16 ENCOUNTER — Ambulatory Visit: Admit: 2017-08-16 | Discharge: 2017-08-16 | Disposition: A | Discharge status: Home / Self Care | Payer: MEDICARE

## 2017-08-16 ENCOUNTER — Emergency Department: Admit: 2017-08-16 | Discharge: 2017-08-16 | Disposition: A | Discharge status: Home / Self Care | Payer: MEDICARE

## 2017-08-16 DIAGNOSIS — R221 Localized swelling, mass and lump, neck: Principal | ICD-10-CM

## 2017-08-19 MED ORDER — AMOXICILLIN 875 MG-POTASSIUM CLAVULANATE 125 MG TABLET
ORAL_TABLET | Freq: Two times a day (BID) | ORAL | 0 refills | 0.00000 days | Status: CP
Start: 2017-08-19 — End: 2017-08-28

## 2017-08-19 MED ORDER — AMOXICILLIN 875 MG-POTASSIUM CLAVULANATE 125 MG TABLET: 1 | tablet | Freq: Two times a day (BID) | 0 refills | 0 days | Status: AC

## 2017-08-19 MED FILL — AMOXICILLIN/CLAVULANATE P/875/125MG/TABS: AMOXICILLIN/CLAVULANATE P/875/125MG/TABS | 7 days supply | Qty: 14 | Fill #0

## 2017-08-20 MED ORDER — ONDANSETRON HCL 8 MG TABLET
PRN refills | 0 days | Status: CP
Start: 2017-08-20 — End: 2017-08-28

## 2017-08-20 MED ORDER — TRAMADOL 50 MG TABLET
ORAL_TABLET | Freq: Four times a day (QID) | ORAL | 0 refills | 0.00000 days | Status: CP | PRN
Start: 2017-08-20 — End: 2017-08-28

## 2017-08-20 MED ORDER — ESCITALOPRAM 20 MG TABLET
ORAL_TABLET | Freq: Every day | GASTROSTOMY | 0 refills | 0 days | Status: CP
Start: 2017-08-20 — End: 2017-08-20

## 2017-08-20 MED ORDER — ESCITALOPRAM 20 MG TABLET: 20 mg | tablet | 0 refills | 0 days

## 2017-08-20 MED FILL — TAMSULOSIN/0.4MG/CAP: TAMSULOSIN/0.4MG/CAP | 90 days supply | Qty: 90 | Fill #1

## 2017-08-21 MED FILL — ONDANSETRON/8MG/TABS: ONDANSETRON/8MG/TABS | 10 days supply | Qty: 30 | Fill #0

## 2017-08-21 MED FILL — ESCITALOPRAM OXALATE/20MG/TABS: ESCITALOPRAM OXALATE/20MG/TABS | 30 days supply | Qty: 30 | Fill #0

## 2017-08-22 ENCOUNTER — Ambulatory Visit: Admit: 2017-08-22 | Discharge: 2017-08-23 | Payer: MEDICARE

## 2017-08-22 DIAGNOSIS — Z930 Tracheostomy status: Secondary | ICD-10-CM

## 2017-08-22 DIAGNOSIS — C329 Malignant neoplasm of larynx, unspecified: Principal | ICD-10-CM

## 2017-08-22 MED ORDER — DOXYCYCLINE HYCLATE 100 MG TABLET
ORAL_TABLET | Freq: Every day | ORAL | 0 refills | 0.00000 days | Status: CP
Start: 2017-08-22 — End: 2017-08-22

## 2017-08-22 MED ORDER — DOXYCYCLINE HYCLATE 100 MG CAPSULE
ORAL_CAPSULE | ORAL | 0 refills | 0 days
Start: 2017-08-22 — End: 2017-08-28

## 2017-08-22 MED ORDER — DOXYCYCLINE HYCLATE 100 MG TABLET: 200 mg | tablet | Freq: Every day | 0 refills | 0 days | Status: AC

## 2017-08-22 MED ORDER — OXYCODONE 5 MG TABLET
ORAL_TABLET | Freq: Four times a day (QID) | ORAL | 0 refills | 0.00000 days | Status: CP | PRN
Start: 2017-08-22 — End: 2017-08-28

## 2017-08-22 MED ORDER — OXYCODONE 5 MG TABLET: 5 mg | tablet | Freq: Four times a day (QID) | 0 refills | 0 days | Status: AC

## 2017-08-22 MED FILL — OXYCODONE HCL/5MG/TABS: OXYCODONE HCL/5MG/TABS | 7 days supply | Qty: 30 | Fill #0

## 2017-08-22 MED FILL — DOXYCYCLINE HYCLATE/100MG/CAPS: DOXYCYCLINE HYCLATE/100MG/CAPS | 14 days supply | Qty: 28 | Fill #0

## 2017-08-28 ENCOUNTER — Ambulatory Visit: Admit: 2017-08-28 | Discharge: 2017-08-29 | Payer: MEDICARE

## 2017-08-28 DIAGNOSIS — C329 Malignant neoplasm of larynx, unspecified: Principal | ICD-10-CM

## 2017-08-28 DIAGNOSIS — B182 Chronic viral hepatitis C: Secondary | ICD-10-CM

## 2017-08-28 MED ORDER — OXYCODONE 5 MG TABLET
ORAL_TABLET | Freq: Four times a day (QID) | ORAL | 0 refills | 0 days | Status: CP | PRN
Start: 2017-08-28 — End: 2017-09-24
  Filled 2017-09-03: qty 120, 30d supply, fill #0

## 2017-08-28 MED ORDER — PROCHLORPERAZINE MALEATE 10 MG TABLET
ORAL_TABLET | Freq: Three times a day (TID) | ORAL | 6 refills | 0 days | Status: CP | PRN
Start: 2017-08-28 — End: 2018-02-27
  Filled 2017-09-03: qty 60, 20d supply, fill #0

## 2017-08-28 MED ORDER — ESCITALOPRAM 20 MG TABLET
ORAL_TABLET | Freq: Every day | ORAL | 0 refills | 0 days | Status: CP
Start: 2017-08-28 — End: 2017-10-27
  Filled 2017-09-29: qty 30, 30d supply, fill #0

## 2017-09-02 ENCOUNTER — Encounter: Payer: Self-pay | Admitting: Nurse Practitioner

## 2017-09-02 DIAGNOSIS — B182 Chronic viral hepatitis C: Secondary | ICD-10-CM | POA: Insufficient documentation

## 2017-09-02 DIAGNOSIS — C329 Malignant neoplasm of larynx, unspecified: Secondary | ICD-10-CM | POA: Insufficient documentation

## 2017-09-02 NOTE — Assessment & Plan Note (Signed)
Initial diagnosis 04/05/2017.  Patient continues cancer care at [facility] on chemo and radiation.  Continue care at [facility].

## 2017-09-02 NOTE — Assessment & Plan Note (Signed)
Diagnosis May 2019.  Patient has had referral to hepatologist.  Followup prn here.

## 2017-09-03 MED FILL — PROCHLORPERAZINE MALEATE 10 MG TABLET: 20 days supply | Qty: 60 | Fill #0 | Status: AC

## 2017-09-03 MED FILL — OXYCODONE 5 MG TABLET: 30 days supply | Qty: 120 | Fill #0 | Status: AC

## 2017-09-08 ENCOUNTER — Ambulatory Visit: Admit: 2017-09-08 | Discharge: 2017-09-09 | Payer: MEDICARE

## 2017-09-08 DIAGNOSIS — C329 Malignant neoplasm of larynx, unspecified: Principal | ICD-10-CM

## 2017-09-22 ENCOUNTER — Ambulatory Visit
Admit: 2017-09-22 | Discharge: 2017-10-13 | Payer: MEDICARE | Attending: Radiation Oncology | Primary: Radiation Oncology

## 2017-09-22 ENCOUNTER — Ambulatory Visit
Admit: 2017-09-22 | Discharge: 2017-10-13 | Payer: MEDICARE | Attending: Speech-Language Pathologist | Primary: Speech-Language Pathologist

## 2017-09-22 ENCOUNTER — Ambulatory Visit: Admit: 2017-09-22 | Discharge: 2017-10-13 | Payer: MEDICARE

## 2017-09-22 ENCOUNTER — Other Ambulatory Visit: Admit: 2017-09-22 | Discharge: 2017-10-13 | Payer: MEDICARE

## 2017-09-22 DIAGNOSIS — C329 Malignant neoplasm of larynx, unspecified: Principal | ICD-10-CM

## 2017-09-22 DIAGNOSIS — R1310 Dysphagia, unspecified: Principal | ICD-10-CM

## 2017-09-22 DIAGNOSIS — G8929 Other chronic pain: Secondary | ICD-10-CM

## 2017-09-22 DIAGNOSIS — C101 Malignant neoplasm of anterior surface of epiglottis: Principal | ICD-10-CM

## 2017-09-22 DIAGNOSIS — B182 Chronic viral hepatitis C: Principal | ICD-10-CM

## 2017-09-27 MED ORDER — OXYCODONE 5 MG TABLET
ORAL_TABLET | Freq: Four times a day (QID) | ORAL | 0 refills | 0 days | Status: CP | PRN
Start: 2017-09-27 — End: 2017-10-21
  Filled 2017-09-29: qty 120, 30d supply, fill #0

## 2017-09-29 MED FILL — OXYCODONE 5 MG TABLET: 30 days supply | Qty: 120 | Fill #0 | Status: AC

## 2017-09-29 MED FILL — ESCITALOPRAM 20 MG TABLET: 30 days supply | Qty: 30 | Fill #0 | Status: AC

## 2017-10-07 MED ORDER — GABAPENTIN 300 MG CAPSULE
ORAL_CAPSULE | 0 refills | 0 days | Status: CP
Start: 2017-10-07 — End: 2017-10-30
  Filled 2017-10-09: qty 30, 30d supply, fill #0

## 2017-10-09 ENCOUNTER — Institutional Professional Consult (permissible substitution): Admit: 2017-10-09 | Discharge: 2017-10-10 | Payer: MEDICARE

## 2017-10-09 DIAGNOSIS — Z23 Encounter for immunization: Principal | ICD-10-CM

## 2017-10-09 MED FILL — GABAPENTIN 300 MG CAPSULE: 30 days supply | Qty: 30 | Fill #0 | Status: AC

## 2017-10-09 MED FILL — FAMOTIDINE 20 MG TABLET: 30 days supply | Qty: 60 | Fill #0 | Status: AC

## 2017-10-09 MED FILL — FAMOTIDINE 20 MG TABLET: 30 days supply | Qty: 60 | Fill #0

## 2017-10-13 ENCOUNTER — Ambulatory Visit
Admit: 2017-10-13 | Discharge: 2017-11-11 | Payer: MEDICARE | Attending: Rehabilitative and Restorative Service Providers" | Primary: Rehabilitative and Restorative Service Providers"

## 2017-10-13 DIAGNOSIS — I890 Lymphedema, not elsewhere classified: Principal | ICD-10-CM

## 2017-10-28 MED ORDER — ESCITALOPRAM 20 MG TABLET
ORAL_TABLET | Freq: Every day | ORAL | 2 refills | 0 days | Status: CP
Start: 2017-10-28 — End: 2018-01-15
  Filled 2017-10-29: qty 90, 90d supply, fill #0

## 2017-10-29 ENCOUNTER — Ambulatory Visit
Admit: 2017-10-29 | Discharge: 2017-11-09 | Payer: MEDICARE | Attending: Speech-Language Pathologist | Primary: Speech-Language Pathologist

## 2017-10-29 ENCOUNTER — Ambulatory Visit: Admit: 2017-10-29 | Discharge: 2017-11-09 | Payer: MEDICARE

## 2017-10-29 DIAGNOSIS — R1310 Dysphagia, unspecified: Secondary | ICD-10-CM

## 2017-10-29 DIAGNOSIS — Z930 Tracheostomy status: Secondary | ICD-10-CM

## 2017-10-29 DIAGNOSIS — C329 Malignant neoplasm of larynx, unspecified: Principal | ICD-10-CM

## 2017-10-29 MED ORDER — OXYCODONE 5 MG TABLET
ORAL_TABLET | Freq: Four times a day (QID) | ORAL | 0 refills | 0 days | Status: CP | PRN
Start: 2017-10-29 — End: 2017-11-26
  Filled 2017-10-29: qty 120, 30d supply, fill #0

## 2017-10-29 MED FILL — OXYCODONE 5 MG TABLET: 30 days supply | Qty: 120 | Fill #0 | Status: AC

## 2017-10-29 MED FILL — ESCITALOPRAM 20 MG TABLET: 90 days supply | Qty: 90 | Fill #0 | Status: AC

## 2017-10-30 MED ORDER — GABAPENTIN 300 MG CAPSULE: capsule | 0 refills | 0 days | Status: AC

## 2017-10-30 MED ORDER — GABAPENTIN 300 MG CAPSULE
ORAL_CAPSULE | ORAL | 3 refills | 0.00000 days | Status: CP
Start: 2017-10-30 — End: 2018-02-27
  Filled 2017-11-03: qty 90, 90d supply, fill #0

## 2017-10-31 DIAGNOSIS — I890 Lymphedema, not elsewhere classified: Principal | ICD-10-CM

## 2017-11-03 MED FILL — GABAPENTIN 300 MG CAPSULE: 90 days supply | Qty: 90 | Fill #0 | Status: AC

## 2017-11-03 MED FILL — PROCHLORPERAZINE MALEATE 10 MG TABLET: ORAL | 20 days supply | Qty: 60 | Fill #0

## 2017-11-03 MED FILL — PROCHLORPERAZINE MALEATE 10 MG TABLET: 20 days supply | Qty: 60 | Fill #0 | Status: AC

## 2017-11-07 DIAGNOSIS — I890 Lymphedema, not elsewhere classified: Principal | ICD-10-CM

## 2017-11-14 ENCOUNTER — Ambulatory Visit
Admit: 2017-11-14 | Discharge: 2017-12-11 | Payer: MEDICARE | Attending: Rehabilitative and Restorative Service Providers" | Primary: Rehabilitative and Restorative Service Providers"

## 2017-11-14 DIAGNOSIS — I890 Lymphedema, not elsewhere classified: Principal | ICD-10-CM

## 2017-11-19 MED ORDER — FAMOTIDINE 20 MG TABLET
1 refills | 0 days | Status: CP
Start: 2017-11-19 — End: 2018-02-27
  Filled 2017-11-21: qty 120, 60d supply, fill #0

## 2017-11-21 DIAGNOSIS — I890 Lymphedema, not elsewhere classified: Principal | ICD-10-CM

## 2017-11-21 MED FILL — FAMOTIDINE 20 MG TABLET: 60 days supply | Qty: 120 | Fill #0 | Status: AC

## 2017-11-26 MED ORDER — OXYCODONE 5 MG TABLET
ORAL_TABLET | Freq: Four times a day (QID) | ORAL | 0 refills | 0 days | Status: CP | PRN
Start: 2017-11-26 — End: 2017-12-17
  Filled 2017-11-26: qty 120, 30d supply, fill #0

## 2017-11-26 MED FILL — OXYCODONE 5 MG TABLET: 30 days supply | Qty: 120 | Fill #0 | Status: AC

## 2017-11-28 DIAGNOSIS — I890 Lymphedema, not elsewhere classified: Principal | ICD-10-CM

## 2017-12-17 ENCOUNTER — Ambulatory Visit: Admit: 2017-12-17 | Discharge: 2017-12-17 | Payer: MEDICARE | Attending: Gastroenterology | Primary: Gastroenterology

## 2017-12-17 ENCOUNTER — Encounter: Admit: 2017-12-17 | Discharge: 2017-12-17 | Payer: MEDICARE | Attending: Gastroenterology | Primary: Gastroenterology

## 2017-12-17 DIAGNOSIS — B182 Chronic viral hepatitis C: Principal | ICD-10-CM

## 2017-12-17 DIAGNOSIS — Z1159 Encounter for screening for other viral diseases: Secondary | ICD-10-CM

## 2017-12-17 DIAGNOSIS — Z114 Encounter for screening for human immunodeficiency virus [HIV]: Secondary | ICD-10-CM

## 2017-12-17 LAB — CBC
HEMATOCRIT: 44.6 % (ref 41.0–53.0)
HEMOGLOBIN: 14.3 g/dL (ref 13.5–17.5)
MEAN CORPUSCULAR HEMOGLOBIN: 31.4 pg (ref 26.0–34.0)
MEAN CORPUSCULAR VOLUME: 98.1 fL (ref 80.0–100.0)
MEAN PLATELET VOLUME: 9.2 fL (ref 7.0–10.0)
PLATELET COUNT: 184 10*9/L (ref 150–440)
RED BLOOD CELL COUNT: 4.55 10*12/L (ref 4.50–5.90)

## 2017-12-17 LAB — HEPATITIS A IGG
HEPATITIS A IGG: NONREACTIVE
Hepatitis A virus Ab.IgG:PrThr:Pt:Ser:Ord:: NONREACTIVE

## 2017-12-17 LAB — HEPATITIS B SURFACE ANTIBODY QUANT: Hepatitis B virus surface Ab:ACnc:Pt:Ser:Qn:: <8

## 2017-12-17 LAB — HEPATITIS B SURFACE ANTIGEN: Hepatitis B virus surface Ag:PrThr:Pt:Ser:Ord:: NONREACTIVE

## 2017-12-17 LAB — BILIRUBIN TOTAL: Bilirubin:MCnc:Pt:Ser/Plas:Qn:: 0.7

## 2017-12-17 LAB — COMPREHENSIVE METABOLIC PANEL
ALBUMIN: 4 g/dL (ref 3.5–5.0)
ALKALINE PHOSPHATASE: 82 U/L (ref 38–126)
ALT (SGPT): 43 U/L (ref <50)
ANION GAP: 7 mmol/L (ref 7–15)
BILIRUBIN TOTAL: 0.7 mg/dL (ref 0.0–1.2)
BUN / CREAT RATIO: 7
CALCIUM: 9.8 mg/dL (ref 8.5–10.2)
CHLORIDE: 100 mmol/L (ref 98–107)
CO2: 32 mmol/L — ABNORMAL HIGH (ref 22.0–30.0)
CREATININE: 0.85 mg/dL (ref 0.70–1.30)
EGFR CKD-EPI AA MALE: >90 mL/min/{1.73_m2} (ref >=60)
EGFR CKD-EPI NON-AA MALE: >90 mL/min/{1.73_m2} (ref >=60)
GLUCOSE RANDOM: 83 mg/dL (ref 65–179)
POTASSIUM: 3.9 mmol/L (ref 3.5–5.0)
PROTEIN TOTAL: 7.8 g/dL (ref 6.5–8.3)
SODIUM: 139 mmol/L (ref 135–145)

## 2017-12-17 LAB — MEAN CORPUSCULAR VOLUME: Lab: 98.1

## 2017-12-17 LAB — HIV ANTIGEN/ANTIBODY COMBO
HIV 1+2 Ab+HIV1 p24 Ag:PrThr:Pt:Ser/Plas:Ord:IA: NONREACTIVE
HIV ANTIGEN/ANTIBODY COMBO: NONREACTIVE

## 2017-12-17 LAB — HEPATITIS B CORE TOTAL ANTIBODY: Hepatitis B virus core Ab:PrThr:Pt:Ser/Plas:Ord:IA: NONREACTIVE

## 2017-12-17 NOTE — Unmapped (Addendum)
[facility]  FAST ??? Fibrosis Assessment Team  Division of Gastroenterology and Hepatology  ??  ??    ??  FIBROSCAN will be performed to assess hepatic fibrosis (scarring) in order to stage this patient's liver disease. This will assist with evaluating the natural course of the disease and will provide important information regarding prognosis, duration of therapy, and potential response to treatment. This information will also help assess risk for hepatocellular carcinoma and need for liver cancer surveillance.??  ??  FibroscanProcedure:   After obtaining verbal consent, the patient was placed in a supine position. Physical characteristics and landmarks were assessed to establish appropriate mid-axillary intercostal space for probe placement. 50Hz  Shear Wave pulses were applied and the resulting Shear Wave and Propagation Speed detected with a 3.5 MHz ultrasonic signal, using the FibroScan probe.  Skin to liver capsule distance and liver parenchyma were accessed during the entire examination with the FibroScan probe. The patient was instructed to breathe normally and to abstain from sudden movements during the procedure, allowing for random measurements of liver stiffness. At least ten Shear Waves were produced; individual measurements of each Shear Wave were calculated. Patient tolerated the procedure well and was discharged without incident.  ??  Probe used [x]  M+    Serial # [id number]                         []  XL+   Serial # [id number]  ??  Main Etiology of Liver Disease:  [x] HCV     [] HBV   [] Alcohol    [] NASH  [] PBC      [] PSC     [] Other________________  ??  50Hz  shear wave pulses were applied and the resulting shear wave and propagation speed detected with a 3.5 MHz ultrasonic signal, using the FibroScan probe.     ??  At least ten Shear Waves were produced; individual measurements of each shear wave were calculated.    ??  Patient tolerated the procedure well and was discharged without incident.  ??  Fibroscan score: ___11.7___kPa  ??  IQR:                       ___6___%  ??  Test performed by: Luiz Ochoa, RN  ??  [facility]  FAST ??? Fibrosis Assessment Team  Division of Gastroenterology and Hepatology  ??  ??    ??  ??  ??  Estimation of the stage of liver fibrosis (Metavir Score):  The results of the Liver Stiffness Score are consistent with the following liver fibrosis stage:  ??  ??  []  F0-F1             []  F2               [x]  F3               []  F4  ??  ??  GENERAL RECOMMENDATIONS ACCORDING TO THE STAGE OF LIVER FIBROSIS.  ??  F0-F1: No-minimal fibrosis. The risk of progression to advanced fibrosis and cirrhosis is low. If the cause of liver disease is not removed, a 1-2 yr follow-up study is recommended.   F2: Significant fibrosis. There is a moderate risk of progression to cirrhosis. If the cause of liver disease is not removed, a follow-up study in 12 months is recommended.  F3: Advanced (pre-cirrhotic stage). The risk of progression to cirrhosis is high. Imaging studies to rule out hepatocellular carcinoma  should be considered. Efforts to remove the cause of liver disease are highly recommended.  F4: Cirrhosis. There is significant risk of portal hypertension and esophageal varices. An upper endoscopy is recommended. Imaging studies for hepatocellular carcinoma screening are recommended.   ??  Any and all FibroScan studies must be carefully evaluated, taking fully into account all individual measurement/scans, patient history and other factors.  As with liver biopsy, any estimation of liver fibrosis may be subject to under or over staging due to sampling error.  Any further medical or surgical intervention should be made only while fully considering the circumstances of this patient and in consultation with this patient.    I have reviewed and interpreted the FIBROSCAN test results as described above.

## 2017-12-17 NOTE — Unmapped (Signed)
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 no  Signs of liver decompensation? no  Previous treatment? naive    Planned regimen: Harvoni (ledipasvir/sofosbuvir 90/400mg ) x 8 weeks  Urgency: Routine Request    Prescribing Provider/NPI: Dr. Gavin Potters / [id number]  Signature waiver form not obtained at this time.  Insurance: None but will have [employer] part D plan starting 01/14/2018    Javier Clark is 57 y.o. Caucasian male who presents to clinic alone and is interested in starting treatment with Harvoni. Pt has h/o laryngeal cancer with trach so unable to swallow pills. He will be getting the trach removed and potentially get a G-Tube. Will defer treatment per Dr. Foy Guadalajara.     Current medications: Patient did not know any names of medications that he takes. Reports daughter knows his medications but she did not home with him today. Recommended to make a list of medicine to carry with him in his wallet for future appointments.   Current Outpatient Medications   Medication Sig Dispense Refill   ??? clonazePAM (KLONOPIN) 0.5 MG tablet TAKE 1/2 TABLET BY G-TUBE ROUTE TWICE DAILY AS NEEDED FOR ANXIETY 30 tablet 0   ??? escitalopram oxalate (LEXAPRO) 20 MG tablet Take 1 tablet (20 mg total) by mouth daily. 90 tablet 2   ??? famotidine (PEPCID) 20 MG tablet TAKE 1 TABLET VIA G-TUBE TWICE DAILY 120 each 1   ??? gabapentin (NEURONTIN) 300 MG capsule TAKE 1 CAPSULE THRU GASTROSTOMY TUBE NIGHTLY 90 capsule 3   ??? oxyCODONE (ROXICODONE) 5 MG immediate release tablet Take 1 tablet (5 mg total) by mouth every six (6) hours as needed for pain. 120 tablet 0   ??? prochlorperazine (COMPAZINE) 10 MG tablet Take 1 tablet (10 mg total) by mouth every eight (8) hours as needed for nausea. 60 tablet 6   ??? tamsulosin (FLOMAX) 0.4 mg capsule TAKE 1 CAPSULE BY MOUTH ONCE DAILY 30 capsule 30     No current facility-administered medications for this visit.        Following topics were discussed during counseling:    1. Indications for medication, dosage and administration.     A. Harvoni 90/400mg  1 tablet to take daily with or without food.    2. Common side effects of medications and management strategies. (fatigue, headache)    3. Importance of adherence to regimen, follow-up clinic visits and lab monitoring.     A.Asked patient to call Kissue Mims at [phone] before starting treatment to establish start date and to schedule TW#4 appointment.    4. Drug-drug interaction.    A. Current medications have been reviewed and assessed for possible interaction.  We discussed the mechanism of drug-drug interaction with acid lowering agents. Advised to check with MD or pharmacist before taking any OTC/herbal medications, with emphasis regarding indigestion/heartburn medications.  Denies use of herbal medication such as milk thistle or [place]. John's wart.  Allergies have been verified. Denies alcohol.   5. Importance of informing pharmacy and clinic of updated contact information.   Discussed the process of obtaining medication through specialty pharmacy and when approved medication will be delivered to patient's home. Stressed importance of being able to be reached over the  phone.    Patient verbalized understanding. Provided contact information for any questions/concerns.       Park Breed, Pharm D., BCPS, BCGP, CPP  [facility]  [address]  [place], [place] [address]  [phone]    This portion of the visit was 15 minutes in duration and greater than 50% was spend in direct counseling and coordination of care regarding hepatitis C medication management.

## 2017-12-17 NOTE — Unmapped (Signed)
1030; Patient present to clinic today for Fibroscan procedure. Patient reported last meal or drink was at 5:00am, more than 3 hours ago. Denied any implantable device or ascites at this time.

## 2017-12-17 NOTE — Unmapped (Addendum)
-   please obtain the blood work we have ordered  - a Fibroscan will be preformed today to assess for chronic liver disease  - you will be contacted to schedule the abdominal ultrasound  - you will meet with our pharmacist Erskine Squibb   - we will plan to see you in a few months to determine if you are able to swallow, as this will affect which treatment options we can use   - these drugs have not been studied in patients with a feeding tube  - return to clinic in 3 months

## 2017-12-17 NOTE — Unmapped (Signed)
P/C requesting refill on Oxycodone , last ordered on 11/26/17, last visit 08/28/17. No Contract or UDS.

## 2017-12-17 NOTE — Unmapped (Signed)
[facility], [place]        Referring Provider:  Girtha Hake, MD  [address]  [facility]  [place], [place] [address]     Primary Care Provider:  Burnice Logan, MD    Other Specialist(s):         PATIENT PROFILE:        Javier Clark is a 57 y.o. male (DOB: 06/15/1960) who is seen in consultation at the request of Dr. Marolyn Haller for evaluation of Hepatitis C.        CHIEF COMPLAINT: Hepatitis C     HISTORY OF PRESENT ILLNESS: This is a 57 y.o. 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  Patient notes that he was diagnosed with hepatitis C April 2019 while undergoing work-up prior to his trach and PEG.  He notes that he had 2 surgeries at age 19 in his early 49s that required blood transfusions.  He denies IV drug use or tattoos.  He also served in [employer] and was stationed in [place]  Labs from September 2019 revealed a hepatitis C viral load greater than 1.4 million and genotype 1a.  He denies jaundice, ascites or episodes of confusion but endorses pruritus recently.  He does not think that he is ever received hepatitis B or A vaccine.  He states that his PEG tube may be removed within the next month pending improvement in his neck swelling.  Labs from August 2019 revealed a hemoglobin of 11.7, platelets 224, AST 39, ALT 24, alk phos 61, T bili 0.4, albumin 3.7 and creatinine 0.67.      REVIEW OF SYSTEMS:     The balance of 12 systems reviewed is negative except as noted in the HPI.     PAST MEDICAL HISTORY:    Past Medical History:   Diagnosis Date   ??? Hepatitis C    ??? Hypertension    ??? Larynx cancer (CMS-HCC)    HCC SURVEILLANCE 12/2017: NO LIVER MASSES  FIBROSCAN 12/2017: 11.7 KPS C/W STAGE F3 ADVANCED FIBROSIS    PAST SURGICAL HISTORY:    Past Surgical History:   Procedure Laterality Date   ??? IR INSERT G-TUBE PERCUTANEOUS  05/29/2017    IR INSERT G-TUBE PERCUTANEOUS 05/29/2017 Gwenlyn Fudge, MD IMG VIR H&V [facility]   ??? PR BRONCHOSCOPY,DIAGNOSTIC  04/05/2017    Procedure: Bronchoscopy, Rigid Or Flexible, W/Wo Fluoroscopic Guidance; Diagnostic, With Cell Washing, When Performed;  Surgeon: Biagio Quint, MD;  Location: MAIN OR [facility];  Service: ENT   ??? PR ESOPHAGOSCOPY,DIAGNOSTIC  04/05/2017    Procedure: Esophagoscopy, Rigid Or Flexible; Diagnostic, W/Wo Collection Of Specimen(S) By Brushing Or Washing;  Surgeon: Biagio Quint, MD;  Location: MA[facility];  Service: ENT   ??? PR LARYNGOSCOPY,DIRECT,DIAGNOSTIC N/A 04/05/2017    Procedure: LARYNGOSCOPY DIRECT, WITH OR WITHOUT TRACHEOSCOPY; DIAGNOSTIC, EXCEPT NEWBORN;  Surgeon: Biagio Quint, MD;  Location: MAIN OR [facility];  Service: ENT   ??? PR TRACHEOSTOMY, PLANNED N/A 04/05/2017    Procedure: TRACHEOSTOMY PLANNED (SEPART PROC);  Surgeon: Biagio Quint, MD;  Location: MAIN OR [facility];  Service: ENT   ??? SPINE SURGERY         MEDICATIONS:      Current Outpatient Medications:   ???  clonazePAM (KLONOPIN) 0.5 MG tablet, TAKE 1/2 TABLET BY G-TUBE ROUTE TWICE DAILY AS NEEDED FOR ANXIETY, Disp: 30 tablet,  Rfl: 0  ???  escitalopram oxalate (LEXAPRO) 20 MG tablet, Take 1 tablet (20 mg total) by mouth daily., Disp: 90 tablet, Rfl: 2  ???  famotidine (PEPCID) 20 MG tablet, TAKE 1 TABLET VIA G-TUBE TWICE DAILY, Disp: 120 each, Rfl: 1  ???  gabapentin (NEURONTIN) 300 MG capsule, TAKE 1 CAPSULE THRU GASTROSTOMY TUBE NIGHTLY, Disp: 90 capsule, Rfl: 3  ???  prochlorperazine (COMPAZINE) 10 MG tablet, Take 1 tablet (10 mg total) by mouth every eight (8) hours as needed for nausea., Disp: 60 tablet, Rfl: 6  ???  tamsulosin (FLOMAX) 0.4 mg capsule, TAKE 1 CAPSULE BY MOUTH ONCE DAILY, Disp: 30 capsule, Rfl: 30  ???  [START ON 12/23/2017] oxyCODONE (ROXICODONE) 5 MG immediate release tablet, Take 1 tablet (5 mg total) by mouth every six (6) hours as needed for pain., Disp: 120 tablet, Rfl: 0    ALLERGIES:    Patient has no known allergies. SOCIAL HISTORY:  Tobacco: former smoker, quit in 2015  EtOH: no current alcohol use; social use in the past  Illicit drugs:  Current use of marijuana  Currently him and wife live with their daughter.  He is retired from a factory job.    FAMILY HISTORY:    Family history is unknown by patient.      VITAL SIGNS:    BP 178/99  - Pulse 62  - Temp 36.8 ??C  - Resp 20  - Ht 167.6 cm (5' 6)  - Wt 70.3 kg (155 lb)  - SpO2 100%  - BMI 25.02 kg/m??   Body mass index is 25.02 kg/m??.    PHYSICAL EXAM:    Normal comprehensive exam:      Constitutional:   Alert, oriented x 3, no acute distress, well nourished   Mental Status:   Thought organized, appropriate affect, normal fluent speech.   HEENT:   PEERL, conjunctiva clear, anicteric, oropharynx clear, neck supple, no LAD. Trach in place with speaking valve   Respiratory: Clear to auscultation, and percussion to the bases, unlabored breathing.     Cardiac: Regular rate and rhythm normal S1 and S2, no murmur.      Abdomen: Soft, non-distended, non-tender. PEG in place. Mild HSM.     Perianal/Rectal Exam Not performed.     Extremities:   No edema, well perfused.   Musculoskeletal: No joint swelling or tenderness noted, no deformities.     Skin: No rashes, jaundice or skin lesions noted. No palmar erythema or spider angiomata     Neuro: No focal deficits.          DIAGNOSTIC STUDIES:  I have reviewed all pertinent diagnostic studies, including:    GI Procedures:  None    Radiographic studies:  None    Laboratory results:    Results for LUCCIANO, VITALI (MRN [id number]) as of 01/05/2018 14:46   Ref. Range 12/17/2017 10:06   WBC Latest Ref Range: 4.5 - 11.0 10*9/L 4.6   RBC Latest Ref Range: 4.50 - 5.90 10*12/L 4.55   HGB Latest Ref Range: 13.5 - 17.5 g/dL 81.1   HCT Latest Ref Range: 41.0 - 53.0 % 44.6   MCV Latest Ref Range: 80.0 - 100.0 fL 98.1   MCH Latest Ref Range: 26.0 - 34.0 pg 31.4   MCHC Latest Ref Range: 31.0 - 37.0 g/dL 91.4   RDW Latest Ref Range: 12.0 - 15.0 % 14.0   MPV Latest Ref Range: 7.0 - 10.0 fL 9.2   Platelet Latest Ref Range: 150 -  440 10*9/L 184   Sodium Latest Ref Range: 135 - 145 mmol/L 139   Potassium Latest Ref Range: 3.5 - 5.0 mmol/L 3.9   Chloride Latest Ref Range: 98 - 107 mmol/L 100   CO2 Latest Ref Range: 22.0 - 30.0 mmol/L 32.0 (H)   Bun Latest Ref Range: 7 - 21 mg/dL 6 (L)   Creatinine Latest Ref Range: 0.70 - 1.30 mg/dL 1.61   BUN/Creatinine Ratio Unknown 7   EGFR CKD-EPI African American, Male Latest Ref Range: >=60 mL/min/1.63m2 >90   EGFR CKD-EPI Non-African American, Male Latest Ref Range: >=60 mL/min/1.23m2 >90   Anion Gap Latest Ref Range: 7 - 15 mmol/L 7   Glucose Latest Ref Range: 65 - 179 mg/dL 83   Calcium Latest Ref Range: 8.5 - 10.2 mg/dL 9.8   Albumin Latest Ref Range: 3.5 - 5.0 g/dL 4.0   Total Protein Latest Ref Range: 6.5 - 8.3 g/dL 7.8   Total Bilirubin Latest Ref Range: 0.0 - 1.2 mg/dL 0.7   AST Latest Ref Range: 19 - 55 U/L 52   ALT Latest Ref Range: <50 U/L 43   Alkaline Phosphatase Latest Ref Range: 38 - 126 U/L 82   Hep A IgG Latest Ref Range: Nonreactive  Nonreactive   Hep B Surface Ag Latest Ref Range: Nonreactive  Nonreactive   Hep B S Ab Latest Ref Range: Nonreactive, [place]  Nonreactive   Hep B Surf Ab Quant Latest Ref Range: <8.00 m(IU)/mL <8.00   Hep B Core Total Ab Latest Ref Range: Nonreactive  Nonreactive   HIV Antigen/Antibody Combo Latest Ref Range: Nonreactive  Nonreactive               ASSESSMENT:      57 y.o. 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  He likely contracted HCV from blood transfusions as he has no other known risk factors. Recent labs do not show signs of chronic liver disease, however he does have notable HSM on exam.  We will evaluate further with Fibroscan today and abdominal ultrasound. Additionally, will assess for immunity to Hep A/B.  We discussed the natural history of HCV, risk factors for more aggressive fibrosis, transmission, and treatment. We discussed the oral therapies to treat HCV have a high cure rate and favorable side effect profile.  These medications have not been tested in patient with PEG tubes, although anecdotally patients have responded to treatment with Harvoni or Epclusa with crushing the pills.  Given that the patient may have his PEG tube removed soon if his swallowing function improves, we we will defer treatment at this time.  Patient will follow-up in 3 months at which point we will decide if it is safe for him to take oral medications or if we should try crushing Harvoni or Epclusa.      PLAN:       - check HAV IgG, HBVs Ab/Ag, HBVc Ag, CBC, CMP, HIV  - Fibroscan today: F3  - abdominal ultrasound ordered; will need q67m imaging to screen for [facility] given F3 fibrosis  - patient to meet with Erskine Squibb today, however treatment will be deferred for the next few months  - encouraged patient to ensure that his family members are tested for HCV  - return to clinic in 3 months with [name]     Patient was seen and discussed with Dr. Foy Guadalajara.  Jackelyn Hoehn, MD  Gastroenterology and Hepatology Fellow, PGY-5  [facility] [place]      I have seen and examined this patient with the Resident.  I have interviewed and examined the patient, discussed the findings with the Resident, and discussed the plan of care.  I agree with the assessment and plan as described.     Alba Destine, M.D.  Professor of Medicine  Director, [facility]  [place] [facility] [place] [facility]

## 2017-12-23 ENCOUNTER — Encounter
Admit: 2017-12-23 | Discharge: 2017-12-23 | Payer: MEDICARE | Attending: Certified Registered" | Primary: Certified Registered"

## 2017-12-23 ENCOUNTER — Ambulatory Visit: Admit: 2017-12-23 | Discharge: 2017-12-23 | Payer: MEDICARE

## 2017-12-23 DIAGNOSIS — C321 Malignant neoplasm of supraglottis: Principal | ICD-10-CM

## 2017-12-23 MED ORDER — OXYCODONE 5 MG TABLET
ORAL_TABLET | Freq: Four times a day (QID) | ORAL | 0 refills | 0.00000 days | Status: CP | PRN
Start: 2017-12-23 — End: 2018-01-15
  Filled 2017-12-23: qty 120, 30d supply, fill #0

## 2017-12-23 MED ORDER — DIPHENHYDRAMINE 25 MG CAPSULE
ORAL_CAPSULE | Freq: Four times a day (QID) | ORAL | 0 refills | 0.00000 days | Status: CP | PRN
Start: 2017-12-23 — End: 2018-03-19

## 2017-12-23 MED ORDER — OXYCODONE 5 MG TABLET: 5 mg | tablet | Freq: Four times a day (QID) | 0 refills | 0 days | Status: AC

## 2017-12-23 MED FILL — PROCHLORPERAZINE MALEATE 10 MG TABLET: 20 days supply | Qty: 60 | Fill #1 | Status: AC

## 2017-12-23 MED FILL — PROCHLORPERAZINE MALEATE 10 MG TABLET: ORAL | 20 days supply | Qty: 60 | Fill #1

## 2017-12-23 MED FILL — TAMSULOSIN 0.4 MG CAPSULE: 90 days supply | Qty: 90 | Fill #0 | Status: AC

## 2017-12-23 MED FILL — TAMSULOSIN 0.4 MG CAPSULE: ORAL | 90 days supply | Qty: 90 | Fill #0

## 2017-12-23 MED FILL — OXYCODONE 5 MG TABLET: 30 days supply | Qty: 120 | Fill #0 | Status: AC

## 2017-12-23 NOTE — Unmapped (Signed)
Otolaryngology-Head and Neck Surgery Established Patient Note  ??  Date of Service: October 29, 2017  Requesting Attending Physician: Curly Rim*  Reason for Consult:  Laryngeal SCC    ??  Assessment:  ?? Diagnosis ICD-10-CM Associated Orders   1. Larynx cancer (CMS-HCC) C32.9 ??   2. 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   3. Dysphagia, unspecified type R13.10 ??   ??  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 The patient completed RT in June 17 2017, but did not complete chemotherapy secondary to side effects.   ??  The patient's case and PET scan from 09/22/2017 was reviewed at tumor board on 9/13, consensus was that he had a complete response. NED on exam today.   ??  ??  ??  Plan:  ??  -I changed his trach today to a new #6 Shiley cuffless without complication. Continue routine tracheostomy care.   - I encouraged the patient to use a humidifier at night and saline bullets to help with his thick secretions.   - He is not tolerating the passy muir valve currently. I explained I do not think we can take the trach out yet. Continue to use PMV as tolerated. Once tolerating, we can start capping trials.   - I will prescribe some steroids to see if his laryngeal edema will improve.   - I will have him see SLP today given his continued dysphagia. 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   - Will discuss possible esophageal dilation for esophageal web at next follow up. In the mean time, he should attempt liquid diet.   -Follow up with RO & MO per trial recommendations  - I will see him back in 4 weeks  -In the mean time he will call me with any concerns or questions.    ??  ??  Chief complaint: Laryngeal cancer s/p tracheostomy  ??  ??  ??  History of Present Illness:     ??  Javier Clark is a 57 y.o.  male being seen in consultation for management of laryngeal squamous cell carcinoma. ??He is a 57 year old male who had previously not seen a doctor in 20 years who presented in March for a several month history of worsening dysphagia. ??He had also lost roughly 20 pounds in the past year. ??He has had worsening dyspnea and noisy breathing prior to presentation in March.  ??He has had difficulty sleeping because he is unable to lay flat due to dyspnea. ??He tried his wife's inhaler which did not seem to help much. ??He also admits to subjective chills and night sweats. ??Denies any frank fevers, otalgia, hemoptysis, hematemesis. ??His family states that his voice has changed over the past several weeks. ??Denies any new neck masses. ??  ??  He quit smoking 3 years ago but prior to that smoked 1.5-2 packs/day for decades. ??He also drank roughly a 12 pack a day up until 3 years ago when he quit as well.   ??  He was admitted to the hospital on 04/05/2017 nad underwent awake tracheostomy and direct laryngoscopy/esophagoscopy with biopsies. He has now started CRT here at [facility]. Here for routine eval and trach check. No complaints with trach. Cleaning his inner canula daily. No bleeding from around the site. Is having some oral ulcers from  the radiation/chemo. Is planning to get Gtube placed secondary to pain with swallowing.   ??  ??  Update 10/29/2017: The patient's case and PET scan from 09/22/2017 was reviewed at tumor board on 9/13, consensus was that he had a complete response.  The patient returns today for a follow up. Most recent modified barium swallow on 09/22/2017 showed mild oral and mild pharyngeal dysphagia. The patient recently saw lymphedema therapy on 10/13/2017 and is wondering today if he can do the exercises at home on his own.   Today, the patient states when he swallows, everything comes back up. The patient states he wants to eat everything he sees, but cannot due to difficulty swallowing. He also reports difficulty breathing with the speaking valve in place and complains of thick secretions.   The patient reports he cleans his inner canula regularly.   ??  ??  Treatment summary:   Dose Site Summary: DV:VOHYWV: 06/16/2017: 6,800/7,000 cGy  Did not complete chemotherapy secondary to side effects.   ??  ??  Past Medical History  ??  Past??Medical??History        Past Medical History:   Diagnosis Date   ??? Hypertension ??      ??  ??  ??  Past Surgical History  ??  Past??Surgical??History         Past Surgical History:   Procedure Laterality Date   ??? IR INSERT G-TUBE PERCUTANEOUS ?? 05/29/2017   ?? IR INSERT G-TUBE PERCUTANEOUS 05/29/2017 Gwenlyn Fudge, MD IMG VIR H&V [facility]   ??? PR BRONCHOSCOPY,DIAGNOSTIC ?? 04/05/2017   ?? Procedure: Bronchoscopy, Rigid Or Flexible, W/Wo Fluoroscopic Guidance; Diagnostic, With Cell Washing, When Performed;  Surgeon: Biagio Quint, MD;  [facility];  Service: ENT   ??? PR ESOPHAGOSCOPY,DIAGNOSTIC ?? 04/05/2017   ?? Procedure: Esophagoscopy, Rigid Or Flexible; Diagnostic, W/Wo Collection Of Specimen(S) By Brushing Or Washing;  Surgeon: Biagio Quint, MD;  Location: MAIN OR [facility];  Service: ENT   ??? PR LARYNGOSCOPY,DIRECT,DIAGNOSTIC N/A 04/05/2017   ?? Procedure: LARYNGOSCOPY DIRECT, WITH OR WITHOUT TRACHEOSCOPY; DIAGNOSTIC, EXCEPT NEWBORN;  Surgeon: Biagio Quint, MD;  Location: MAIN OR [facility];  Service: ENT   ??? PR TRACHEOSTOMY, PLANNED N/A 04/05/2017   ?? Procedure: TRACHEOSTOMY PLANNED (SEPART PROC);  Surgeon: Biagio Quint, MD;  Location: MAIN [facility];  Service: ENT      ??  ??  ??  Medications    ??  Current??Medications          Current Outpatient Medications   Medication Sig Dispense Refill   ??? escitalopram oxalate (LEXAPRO) 20 MG tablet Take 1 tablet (20 mg total) by mouth daily. 90 tablet 2   ??? famotidine (PEPCID) 20 MG tablet TAKE 1 TABLET VIA G-TUBE TWICE DAILY 120 each 1   ??? gabapentin (NEURONTIN) 300 MG capsule TAKE 1 CAPSULE THRU GASTROSTOMY TUBE NIGHTLY 30 capsule 0 ??? oxyCODONE (ROXICODONE) 5 MG immediate release tablet Take 1 tablet (5 mg total) by mouth every six (6) hours as needed for pain. 120 tablet 0   ??? prochlorperazine (COMPAZINE) 10 MG tablet Take 1 tablet (10 mg total) by mouth every eight (8) hours as needed for nausea. 60 tablet 6   ??? tamsulosin (FLOMAX) 0.4 mg capsule TAKE 1 CAPSULE BY MOUTH ONCE DAILY 30 capsule 30   ??? clonazePAM (KLONOPIN) 0.5 MG tablet TAKE 1/2 TABLET BY G-TUBE ROUTE TWICE DAILY AS NEEDED FOR ANXIETY (Patient not taking: Reported on 10/29/2017) 30 tablet 0   ??  No current facility-administered medications for this visit.       ??  ??  ??  Allergies  ??  Patient has no known allergies.  ??  ??  Social History:  ??  No longer smoking since cancer diagnosis  Tobacco use:  reports that he quit smoking about 4 years ago. His smoking use included cigarettes. He has never used smokeless tobacco.  Alcohol use:  reports no history of alcohol use.  Drug use:  reports current drug use. Drug: Marijuana.  ??  ??  Family History  ??  The patient's Family history is unknown by patient..  ??  ??  Review of Systems  A 10 system review of systems was negative except as noted in HPI and intake encounter form, which was reviewed and scanned into the media section of the medical record  ??  ??  ??  Objective      ??  Vital Signs  BP 141/90  - Pulse 78  - Temp 36.9 ??C (98.5 ??F) (Temporal)  - Ht 167.6 cm (5' 6)  - Wt 70.5 kg (155 lb 8 oz)  - SpO2 96%  - BMI 25.10 kg/m??   ??  ??  Physical Exam  ??  Vitals signs reviewed in the nursing chart  General:  WD, WN, well groomed, pleasant male, sitting up in NAD, normal appearance. Able to produce raspy voice with finger occlusion.   Psychiatric/Neuro:  A&Ox3, Normal mood and affect, Following commands, MAE. Cranial nerves 2-12 intact, No focal deficits.   Eyes:?? EOM Intact, sclera anicteric, no conjunctival injection, PERRL.   Nose:?? Normal external nasal pyramid, anterior rhinoscopy shows normal   mucosa and turbinates. Septal deviation to the left.   Oral Cavity:  Normal lips, gingiva, tongue, hard palate; pink, moist mucosa, no lesions, tongue is mobile.  Floor of mouth is soft.  Oropharynx:  No masses or lesions.  BOT soft, normal tonsils, soft palate and posterior pharynx, symmetric pink, moist mucosa  Neck: # 6 Shiley in place, some submental ruddiness 2/2 to radiation. No masses. #6 Shiley tracheostomy exchanged for new tube without difficulty.   Lymphatics:  No cervical lymphadenopathy. There is submental lymphedema with postradiation changes.   Respiratory:?? Normal respiratory effort, symmetric chest rise, no accessory muscle usage.  ??  Laryngoscopy  Procedure Note  ??  Pre-operative Diagnosis:   ?? Diagnosis ICD-10-CM Associated Orders   1. Larynx cancer (CMS-HCC) C32.9 ??   2. 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   3. Dysphagia, unspecified type R13.10 ??   ??  ??  Post-operative Diagnosis: same  ??  Anesthesia: Lidocaine 4% and Oxymetazoline 0.05%  ??  Surgeon: Cheryle Horsfall, MD  ??  Endoscopy Type:  Flexible Fiberoptic Laryngoscopy   ??  Indications: To better evaluate the patient???s tumor site and evaluate treatment response  ??  Procedure Details:    The patient was placed in the sitting position.  After topical anesthesia and decongestion, the 4 mm laryngoscope was passed.  The nasal cavities, nasopharynx, oropharynx, hypopharynx, and larynx were all examined.  Vocal cords were examined during respiration and phonation.  The following findings were noted:  ??  Findings:  Nasal cavities:  Normal mucosa, patent, no masses, lesions, normal turbinates and septum, mucosa pink and moist.  Nasopharynx:  Normal pink moist mucosa, normal eustachian tubes, no masses.  Oropharynx:  Normal palate, tonsils, pharyngeal wall, and base of tongue, no masses, mucosa pink and moist  Hypopharynx: Normal pharyngeal walls and pyriform sinuses, some  pooling of secretions  Larynx:    Omega shaped epiglottis s/p post radiation changes. Edema of the supraglottis and false vocal folds and arytenoids. TVFs are not edematous and are normal and mobile bilaterally. No masses visualized.   ??  Tracheoscopy was done today. Mild crusting of the proximal posterior wall of the trachea. No lesions.  No active bleeding.

## 2017-12-23 NOTE — Unmapped (Signed)
Otolaryngology-Head and Neck Surgery Established Patient Note  ??  Date of Service: October 29, 2017  Requesting Attending Physician: Curly Rim*  Reason for Consult:  Laryngeal SCC    ??  Assessment:  ?? Diagnosis ICD-10-CM Associated Orders   1. Larynx cancer (CMS-HCC) C32.9 ??   2. 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   3. Dysphagia, unspecified type R13.10 ??   ??  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 The patient completed RT in June 17 2017, but did not complete chemotherapy secondary to side effects.   ??  The patient's case and PET scan from 09/22/2017 was reviewed at tumor board on 9/13, consensus was that he had a complete response. NED on exam today.   ??  ??  ??  Plan:  ??  -I changed his trach today to a new #6 Shiley cuffless without complication. Continue routine tracheostomy care.   - I encouraged the patient to use a humidifier at night and saline bullets to help with his thick secretions.   - He is not tolerating the passy muir valve currently. I explained I do not think we can take the trach out yet. Continue to use PMV as tolerated. Once tolerating, we can start capping trials.   - I will prescribe some steroids to see if his laryngeal edema will improve.   - I will have him see SLP today given his continued dysphagia. 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   - Will discuss possible esophageal dilation for esophageal web at next follow up. In the mean time, he should attempt liquid diet.   -Follow up with RO & MO per trial recommendations  - I will see him back in 4 weeks  -In the mean time he will call me with any concerns or questions.    ??  ??  Chief complaint: Laryngeal cancer s/p tracheostomy  ??  ??  ??  History of Present Illness:     ??  Javier Clark is a 57 y.o.  male being seen in consultation for management of laryngeal squamous cell carcinoma. ??He is a 57 year old male who had previously not seen a doctor in 20 years who presented in March for a several month history of worsening dysphagia. ??He had also lost roughly 20 pounds in the past year. ??He has had worsening dyspnea and noisy breathing prior to presentation in March.  ??He has had difficulty sleeping because he is unable to lay flat due to dyspnea. ??He tried his wife's inhaler which did not seem to help much. ??He also admits to subjective chills and night sweats. ??Denies any frank fevers, otalgia, hemoptysis, hematemesis. ??His family states that his voice has changed over the past several weeks. ??Denies any new neck masses. ??  ??  He quit smoking 3 years ago but prior to that smoked 1.5-2 packs/day for decades. ??He also drank roughly a 12 pack a day up until 3 years ago when he quit as well.   ??  He was admitted to the hospital on 04/05/2017 nad underwent awake tracheostomy and direct laryngoscopy/esophagoscopy with biopsies. He has now started CRT here at [facility]. Here for routine eval and trach check. No complaints with trach. Cleaning his inner canula daily. No bleeding from around the site. Is having some oral ulcers from  the radiation/chemo. Is planning to get Gtube placed secondary to pain with swallowing.   ??  ??  Update 10/29/2017: The patient's case and PET scan from 09/22/2017 was reviewed at tumor board on 9/13, consensus was that he had a complete response.  The patient returns today for a follow up. Most recent modified barium swallow on 09/22/2017 showed mild oral and mild pharyngeal dysphagia. The patient recently saw lymphedema therapy on 10/13/2017 and is wondering today if he can do the exercises at home on his own.   Today, the patient states when he swallows, everything comes back up. The patient states he wants to eat everything he sees, but cannot due to difficulty swallowing. He also reports difficulty breathing with the speaking valve in place and complains of thick secretions.   The patient reports he cleans his inner canula regularly.   ??  ??  Treatment summary:   Dose Site Summary: DV:VOHYWV: 06/16/2017: 6,800/7,000 cGy  Did not complete chemotherapy secondary to side effects.   ??  ??  Past Medical History  ??  Past??Medical??History        Past Medical History:   Diagnosis Date   ??? Hypertension ??      ??  ??  ??  Past Surgical History  ??  Past??Surgical??History         Past Surgical History:   Procedure Laterality Date   ??? IR INSERT G-TUBE PERCUTANEOUS ?? 05/29/2017   ?? IR INSERT G-TUBE PERCUTANEOUS 05/29/2017 Gwenlyn Fudge, MD IMG VIR [facility]   ??? PR BRONCHOSCOPY,DIAGNOSTIC ?? 04/05/2017   ?? Procedure: Bronchoscopy, Rigid Or Flexible, W/Wo Fluoroscopic Guidance; Diagnostic, With Cell Washing, When Performed;  Surgeon: Biagio Quint, MD;  Location: MAIN OR [facility];  Service: ENT   ??? PR ESOPHAGOSCOPY,DIAGNOSTIC ?? 04/05/2017   ?? Procedure: Esophagoscopy, Rigid Or Flexible; Diagnostic, W/Wo Collection Of Specimen(S) By Brushing Or Washing;  Surgeon: Biagio Quint, MD;  Location: MAIN OR [facility];  Service: ENT   ??? PR LARYNGOSCOPY,DIRECT,DIAGNOSTIC N/A 04/05/2017   ?? Procedure: LARYNGOSCOPY DIRECT, WITH OR WITHOUT TRACHEOSCOPY; DIAGNOSTIC, EXCEPT NEWBORN;  Surgeon: Biagio Quint, MD;  Location: MAIN OR [facility];  Service: ENT   ??? PR TRACHEOSTOMY, PLANNED N/A 04/05/2017   ?? Procedure: TRACHEOSTOMY PLANNED (SEPART PROC);  Surgeon: Biagio Quint, MD;  Location: MAIN OR [facility];  Service: ENT      ??  ??  ??  Medications    ??  Current??Medications          Current Outpatient Medications   Medication Sig Dispense Refill   ??? escitalopram oxalate (LEXAPRO) 20 MG tablet Take 1 tablet (20 mg total) by mouth daily. 90 tablet 2   ??? famotidine (PEPCID) 20 MG tablet TAKE 1 TABLET VIA G-TUBE TWICE DAILY 120 each 1   ??? gabapentin (NEURONTIN) 300 MG capsule TAKE 1 CAPSULE THRU GASTROSTOMY TUBE NIGHTLY 30 capsule 0 ??? oxyCODONE (ROXICODONE) 5 MG immediate release tablet Take 1 tablet (5 mg total) by mouth every six (6) hours as needed for pain. 120 tablet 0   ??? prochlorperazine (COMPAZINE) 10 MG tablet Take 1 tablet (10 mg total) by mouth every eight (8) hours as needed for nausea. 60 tablet 6   ??? tamsulosin (FLOMAX) 0.4 mg capsule TAKE 1 CAPSULE BY MOUTH ONCE DAILY 30 capsule 30   ??? clonazePAM (KLONOPIN) 0.5 MG tablet TAKE 1/2 TABLET BY G-TUBE ROUTE TWICE DAILY AS NEEDED FOR ANXIETY (Patient not taking: Reported on 10/29/2017) 30 tablet 0   ??  No current facility-administered medications for this visit.       ??  ??  ??  Allergies  ??  Patient has no known allergies.  ??  ??  Social History:  ??  No longer smoking since cancer diagnosis  Tobacco use:  reports that he quit smoking about 4 years ago. His smoking use included cigarettes. He has never used smokeless tobacco.  Alcohol use:  reports no history of alcohol use.  Drug use:  reports current drug use. Drug: Marijuana.  ??  ??  Family History  ??  The patient's Family history is unknown by patient..  ??  ??  Review of Systems  A 10 system review of systems was negative except as noted in HPI and intake encounter form, which was reviewed and scanned into the media section of the medical record  ??  ??  ??  Objective      ??  Vital Signs  BP 141/90  - Pulse 78  - Temp 36.9 ??C (98.5 ??F) (Temporal)  - Ht 167.6 cm (5' 6)  - Wt 70.5 kg (155 lb 8 oz)  - SpO2 96%  - BMI 25.10 kg/m??   ??  ??  Physical Exam  ??  Vitals signs reviewed in the nursing chart  General:  WD, WN, well groomed, pleasant male, sitting up in NAD, normal appearance. Able to produce raspy voice with finger occlusion.   Psychiatric/Neuro:  A&Ox3, Normal mood and affect, Following commands, MAE. Cranial nerves 2-12 intact, No focal deficits.   Eyes:?? EOM Intact, sclera anicteric, no conjunctival injection, PERRL.   Nose:?? Normal external nasal pyramid, anterior rhinoscopy shows normal   mucosa and turbinates. Septal deviation to the left.   Oral Cavity:  Normal lips, gingiva, tongue, hard palate; pink, moist mucosa, no lesions, tongue is mobile.  Floor of mouth is soft.  Oropharynx:  No masses or lesions.  BOT soft, normal tonsils, soft palate and posterior pharynx, symmetric pink, moist mucosa  Neck: # 6 Shiley in place, some submental ruddiness 2/2 to radiation. No masses. #6 Shiley tracheostomy exchanged for new tube without difficulty.   Lymphatics:  No cervical lymphadenopathy. There is submental lymphedema with postradiation changes.   Respiratory:?? Normal respiratory effort, symmetric chest rise, no accessory muscle usage.  ??  Laryngoscopy  Procedure Note  ??  Pre-operative Diagnosis:   ?? Diagnosis ICD-10-CM Associated Orders   1. Larynx cancer (CMS-HCC) C32.9 ??   2. 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   3. Dysphagia, unspecified type R13.10 ??   ??  ??  Post-operative Diagnosis: same  ??  Anesthesia: Lidocaine 4% and Oxymetazoline 0.05%  ??  Surgeon: Cheryle Horsfall, MD  ??  Endoscopy Type:  Flexible Fiberoptic Laryngoscopy   ??  Indications: To better evaluate the patient???s tumor site and evaluate treatment response  ??  Procedure Details:    The patient was placed in the sitting position.  After topical anesthesia and decongestion, the 4 mm laryngoscope was passed.  The nasal cavities, nasopharynx, oropharynx, hypopharynx, and larynx were all examined.  Vocal cords were examined during respiration and phonation.  The following findings were noted:  ??  Findings:  Nasal cavities:  Normal mucosa, patent, no masses, lesions, normal turbinates and septum, mucosa pink and moist.  Nasopharynx:  Normal pink moist mucosa, normal eustachian tubes, no masses.  Oropharynx:  Normal palate, tonsils, pharyngeal wall, and base of tongue, no masses, mucosa pink and moist  Hypopharynx: Normal pharyngeal walls and pyriform sinuses, some  pooling of secretions  Larynx:    Omega shaped epiglottis s/p post radiation changes. Edema of the supraglottis and false vocal folds and arytenoids. TVFs are not edematous and are normal and mobile bilaterally. No masses visualized.   ??  Tracheoscopy was done today. Mild crusting of the proximal posterior wall of the trachea. No lesions.  No active bleeding.

## 2017-12-24 NOTE — Unmapped (Signed)
Preoperative Diagnosis:   Laryngeal cancer    Postoperative Diagnosis:  Same    Procedure(s) Performed:  1.  Esophagoscopy with esophageal dilation CPT code 82956.  2.  Direct laryngoscopy CPT code 21308.    Teaching Surgeon: Cheryle Horsfall , M.D.    Assistant Surgeon:  Phillips Climes, MD    Anesthesia:  General.    Specimens:  None    Intravenous fluids: Per anesthesia record    Urine output: Per anesthesia record    Estimated Blood Loss:  Less than 10 mL's.    Complications:  None.    Operative Findings:    1.  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   2.  Esophgeal dilation performed via balloon dilation followed by [facility] dilators to starting at 34 and ending at 33.  Successful esophagoscopy completed after dilation.  No abnormal masses or lesions noted on laryngoscopy or esophagoscopy.    Procedure:  The patient was appropriately identified in the preoperative holding area.  Risks, benefits and alternatives were reviewed with the patient, who agreed to proceed and signed consent.    The patient was subsequently transferred back to the operating room by Anesthesia staff and placed in supine position on the operating Table.  Following, first pre-procedural timeout, including patient and procedure verification, general anesthesia was induced without complication. The patient was intubated, circuit connected and adequate ventilation was confirmed.    Eye pads were then put in place and the patient was rotated 90 degrees counterclockwise away from Anesthesia staff.    After a second pre-procedural timeout to confirm patient identification, surgical procedure and site, a mouth guard was then put in place and the Dedo laryngoscope was used for aerodigestive tract evaluation. The patient's oral cavity, oropharynx, hypopharynx, and larynx were examined thoroughly. Findings and biopsies as dictated above.      Next, the rigid cervical esophagoscope was carefully introduced and advanced to the introitus.  Given moderate stenosis at the level of the upper esophageal sphincter, we were unable to pass the cervical esophagoscope. Dilation was first attempted with the balloon. Using the balloon dilator the esophagus was dilated to 10 mmHg for 2 minutes. This was performed twice. We then decided to perform esophgeal dilation using hte [facility] dilators. Dilation was then performed via [facility] dilators to starting at 34 and ending at 73. The dilation was performed without resistance.  Successful esophagoscopy completed after dilation.  No abnormal masses or lesions noted on laryngoscopy or esophagoscopy. No bleeding seen in the esophagus after dilation.    Findings as described above.   The esophagoscope and mouth guard were then removed from the patient.    At that point in time, the care of the patient was returned to Anesthesia staff who successfully reversed general anesthesia without complication.  The patient awoke and was transported to post-anesthesia care unit in stable condition.      There were no immediate complications.      Attending Surgeon Attestation: I, Dr. Cheryle Horsfall, was present and participated throughout the entire case.

## 2017-12-24 NOTE — Unmapped (Signed)
Brief Operative Note  (CSN: [id number])      Date of Surgery: 12/23/2017    Pre-op Diagnosis: larynx cancer    Post-op Diagnosis: Same3    Procedure(s):  LARYNGOSCOPY, DIRECT, OPERATIVE, WITH BIOPSY: 31535 (CPT??)  ESOPHAGOSCOPY, RIGID OR FLEXIBLE; DIAGNOSTIC, W/WO COLLECTION OF SPECIMEN(S) BY BRUSHING OR WASHING: 98119 (CPT??)  ESOPHAGOSCOPY, RIGID OR FLEXIBLE; WITH INSERTION OF GUIDE WIRE FOLLOWED BY DILATION OVER GUIDE WIRE: 43226 (CPT??)  Note: Revisions to procedures should be made in chart - see Procedures activity.    Performing Service: ENT  Surgeon(s) and Role:     * Biagio Quint, MD - Primary     * Karrie Meres, MD - Assisting     * Joyce Copa, MD - Resident - Assisting    Assistant: None    Findings: Dilated esophagus up to 52 on [facility] dilators    Anesthesia: General    Estimated Blood Loss: 5 mL    Complications: None    Specimens: None collected    Implants: * No implants in log *    Surgeon Notes: I was present and scrubbed for the entire procedure    Phillips Climes   Date: 12/23/2017  Time: 4:27 PM

## 2017-12-29 ENCOUNTER — Ambulatory Visit: Admit: 2017-12-29 | Discharge: 2017-12-29 | Payer: MEDICARE

## 2017-12-29 DIAGNOSIS — B182 Chronic viral hepatitis C: Principal | ICD-10-CM

## 2018-01-05 NOTE — Unmapped (Signed)
-----   Message from Suzy Bouchard, [place] sent at 01/05/2018  8:55 AM EST -----  Regarding: reqeust for return ENT  Request for return ENT app in January 2020.  ----- Message -----  From: Biagio Quint, MD  Sent: 01/04/2018   3:24 PM EST  To: Suzy Bouchard, RN, Rinaldo Ratel, CMA  Subject: Follow up                                        Hi can we schedule this patient to follow up with me sometime in early January wherever I have room in clinic.     Thanks,  Santina Evans

## 2018-01-15 MED ORDER — ESCITALOPRAM 20 MG TABLET
ORAL_TABLET | Freq: Every day | ORAL | 1 refills | 0 days | Status: CP
Start: 2018-01-15 — End: 2018-04-17

## 2018-01-15 NOTE — Unmapped (Signed)
P/C requesting refill on Lexapro , last ordered on 10/28/17, last visit 08/28/17. He has changed from pharmacy assistance to [facility]. Also requesting refill on Oxycodone, last ordered 12/23/17, last visit was 08/28/17. No contract or UDS.

## 2018-01-19 ENCOUNTER — Ambulatory Visit
Admit: 2018-01-19 | Discharge: 2018-02-07 | Payer: MEDICARE | Attending: Speech-Language Pathologist | Primary: Speech-Language Pathologist

## 2018-01-19 ENCOUNTER — Ambulatory Visit: Admit: 2018-01-19 | Discharge: 2018-01-19 | Payer: MEDICARE

## 2018-01-19 DIAGNOSIS — C329 Malignant neoplasm of larynx, unspecified: Principal | ICD-10-CM

## 2018-01-19 DIAGNOSIS — Z930 Tracheostomy status: Secondary | ICD-10-CM

## 2018-01-19 DIAGNOSIS — R1310 Dysphagia, unspecified: Secondary | ICD-10-CM

## 2018-01-19 DIAGNOSIS — R1313 Dysphagia, pharyngeal phase: Secondary | ICD-10-CM | POA: Diagnosis not present

## 2018-01-19 DIAGNOSIS — Q394 Esophageal web: Secondary | ICD-10-CM | POA: Diagnosis not present

## 2018-01-19 DIAGNOSIS — I10 Essential (primary) hypertension: Secondary | ICD-10-CM | POA: Diagnosis not present

## 2018-01-19 DIAGNOSIS — K222 Esophageal obstruction: Secondary | ICD-10-CM | POA: Diagnosis not present

## 2018-01-19 DIAGNOSIS — C321 Malignant neoplasm of supraglottis: Secondary | ICD-10-CM | POA: Diagnosis not present

## 2018-01-19 DIAGNOSIS — R911 Solitary pulmonary nodule: Secondary | ICD-10-CM | POA: Diagnosis not present

## 2018-01-19 DIAGNOSIS — Z923 Personal history of irradiation: Secondary | ICD-10-CM | POA: Diagnosis not present

## 2018-01-19 DIAGNOSIS — R590 Localized enlarged lymph nodes: Secondary | ICD-10-CM | POA: Diagnosis not present

## 2018-01-19 DIAGNOSIS — Z09 Encounter for follow-up examination after completed treatment for conditions other than malignant neoplasm: Secondary | ICD-10-CM | POA: Diagnosis not present

## 2018-01-19 DIAGNOSIS — R0600 Dyspnea, unspecified: Secondary | ICD-10-CM | POA: Diagnosis not present

## 2018-01-19 MED ORDER — METHYLPREDNISOLONE 4 MG TABLETS IN A DOSE PACK
PACK | Freq: Every day | ORAL | 0 refills | 0.00000 days | Status: CP
Start: 2018-01-19 — End: 2018-06-17

## 2018-01-19 NOTE — Unmapped (Signed)
[facility] ADULT SPEECH THERAPY   OUTPATIENT SPEECH PATHOLOGY  01/19/2018 1000      Patient Name: Javier Clark  Date of Birth:[birth year]  Session Number: 1  Diagnosis: laryngeal cancer    Date of Evaluation: 01/19/18     Referred by: Dr. Cheryle Horsfall  Reason for Referral: MBSS    Assessment: Pt seen today for MBSS in lateral and AP views. He currently presents with an overall moderate pharyngeal dysphagia likely 2/2 history of XRT. This is further c/b moderate-severely reduced apposition of the BOT to the posterior pharyngeal wall, mildly reduced pharyngeal constriction, and reduced laryngeal closure. These deficits resulted in trace, flash laryngeal penetration above the TVC with thin liquids. No laryngeal penetration with remaining consistencies (suspect due to trace thin liquid residual). No aspiration visualized on today's study. Moderate-severe vallecula residual likely 2/2 poor oropharyngeal pumping mechanism which is greatest with solid consistencies. This is fully cleared with additional dry swallows and liquid wash. Note ?appearance of projective posterior upper esophageal tissue in the C5-C6 region which is congruent with appearance of CP bar. This is not obstructive to bolus passage though does result in trace backflow.     Recommend continued regular diet and thin liquids following general aspiration precautions: upright with intake, small and single bites/sips, slow rate, alternate solids/liquids, multiple swallows per bite/sip. Encouraged pt to initiate transition from G-tube to full PO intake with use of supplements (Ensure, Boost). Pt verbalized understanding of all education provided. All questions and concerns addressed at this time.     Dysphagia Diagnosis: Moderate pharyngeal stage dysphagia  Dysphagia Severity Index: 4 - Moderate dysphagia    PO Recommendations : Regular Solids (no restrictions), Soft (Dysphagia 3), Thin liquids, Medication Administration via PO, Medication Crushed in applesauce    Recommended Compensatory Techniques : Slow rate, Small sips/bites, No mixed consistencies, Alternate liquids and solids, Double swallow    Prognosis:  Good            Positive Prognosis Rationale: Behavior, symptom severity, Motivation, Good safety awareness      Goals:  Patient and Family Goals: improve swallowing     SUBJECTIVE:  Javier Clark is a pleasant, ambulatory 58 y.o. 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 The patient completed RT in June 17 2017, but did not complete chemotherapy secondary to side effects. Now s/p esophageal dilation on 12/23/17. Improving dysphagia but continued inability to tolerate capping. Today, he continues to use his G-tube for supplementation. He reported L neck pain with swallowing solids, no difficulty with liquids. Pain is described as throbbing and he requires medications to alleviate. He cannot tolerate his speaking valve (it's hard to breathe). PO intake consists of regular solids (Big Mac, pizza). No coughing/choking with intake.     Communication Preference: Verbal    Barriers to Learning: No Barriers               Prior treatment for referral reason: No    Pain?: No   Precautions: Aspiration    Prior Function: Independent      Past Medical History:   Diagnosis Date   ??? Hepatitis C    ??? Hypertension    ??? Larynx cancer (CMS-HCC)       Family History   Family history unknown: Yes     Past Surgical History:   Procedure Laterality Date   ??? IR INSERT G-TUBE PERCUTANEOUS  05/29/2017    IR INSERT G-TUBE PERCUTANEOUS 05/29/2017 Gwenlyn Fudge, MD IMG VIR H&[facility]   ??? PR BRONCHOSCOPY,DIAGNOSTIC  04/05/2017    Procedure: Bronchoscopy, Rigid Or Flexible, W/Wo Fluoroscopic Guidance; Diagnostic, With Cell Washing, When Performed;  Surgeon: Biagio Quint, MD;  Location: MAIN OR [facility];  Service: ENT   ??? PR ESOPHAGOSCOPY,DIAGNOSTIC  04/05/2017    Procedure: Esophagoscopy, Rigid Or Flexible; Diagnostic, W/Wo Collection Of Specimen(S) By Brushing Or Washing;  Surgeon: Biagio Quint, MD;  Location: MAIN OR [facility];  Service: ENT   ??? PR ESOPHAGOSCOPY,DIAGNOSTIC N/A 12/23/2017    Procedure: ESOPHAGOSCOPY, RIGID OR FLEXIBLE; DIAGNOSTIC, W/WO COLLECTION OF SPECIMEN(S) BY BRUSHING OR WASHING;  Surgeon: Biagio Quint, MD;  Location: MAIN OR [place];  Service: ENT   ??? PR ESOPHAGOSCOPY,DILATION OVER GUIDE N/A 12/23/2017    Procedure: ESOPHAGOSCOPY, RIGID OR FLEXIBLE; WITH INSERTION OF GUIDE WIRE FOLLOWED BY DILATION OVER GUIDE WIRE;  Surgeon: Biagio Quint, MD;  Location: MAIN OR [facility];  Service: ENT   ??? PR LARYNGOSCOPY,DIRCT,OP,BIOPSY N/A 12/23/2017    Procedure: LARYNGOSCOPY, DIRECT, OPERATIVE, WITH BIOPSY;  Surgeon: Biagio Quint, MD;  Location: MAIN OR [facility];  Service: ENT   ??? PR LARYNGOSCOPY,DIRECT,DIAGNOSTIC N/A 04/05/2017    Procedure: LARYNGOSCOPY DIRECT, WITH OR WITHOUT TRACHEOSCOPY; DIAGNOSTIC, EXCEPT NEWBORN;  Surgeon: Biagio Quint, MD;  Location: MAIN OR [facility];  Service: ENT   ??? PR TRACHEOSTOMY, PLANNED N/A 04/05/2017    Procedure: TRACHEOSTOMY PLANNED (SEPART PROC);  Surgeon: Biagio Quint, MD;  Location: MAIN OR [facility];  Service: ENT   ??? SPINE SURGERY      No Known Allergies  Social History     Tobacco Use   ??? Smoking status: Former Smoker     Types: Cigarettes     Last attempt to quit: 03/14/2013     Years since quitting: 4.8   ??? Smokeless tobacco: Never Used   Substance Use Topics   ??? Alcohol use: No      Current Outpatient Medications   Medication Sig Dispense Refill   ??? clonazePAM (KLONOPIN) 0.5 MG tablet TAKE 1/2 TABLET BY G-TUBE ROUTE TWICE DAILY AS NEEDED FOR ANXIETY 30 tablet 0   ??? diphenhydrAMINE (BENADRYL) 25 mg capsule Take 1 capsule (25 mg total) by mouth every six (6) hours as needed for itching or allergies. 20 capsule 0   ??? escitalopram oxalate (LEXAPRO) 20 MG tablet Take 1 tablet (20 mg total) by mouth daily. 90 tablet 1   ??? famotidine (PEPCID) 20 MG tablet TAKE 1 TABLET VIA G-TUBE TWICE DAILY 120 each 1   ??? gabapentin (NEURONTIN) 300 MG capsule TAKE 1 CAPSULE THRU GASTROSTOMY TUBE NIGHTLY 90 capsule 3   ??? methylPREDNISolone (MEDROL DOSEPACK) 4 mg tablet Take 1 tablet (4 mg total) by mouth daily. follow package directions 1 Package 0   ??? [START ON 01/22/2018] oxyCODONE (ROXICODONE) 5 MG immediate release tablet Take 1 tablet (5 mg total) by mouth every six (6) hours as needed for pain. 120 tablet 0   ??? prochlorperazine (COMPAZINE) 10 MG tablet Take 1 tablet (10 mg total) by mouth every eight (8) hours as needed for nausea. 60 tablet 6   ??? tamsulosin (FLOMAX) 0.4 mg capsule TAKE 1 CAPSULE BY MOUTH ONCE DAILY 30 capsule 30     No current facility-administered medications for this encounter.      Objective Swallow  Dysphagia History: Please see current functional status for full details   Diet Prior to this Study: Regular diet and  thin liquids, G-tube in place  Respiratory Status : Room air  Positioning : Upright in chair, Lateral View, MBSS Chair, AP View  Secretions : none    Presentation Methods Used During Study  Bolus Presentation : Spoon, Fed self, Straw Sip    Thin Liquids   Oral Stage: WFL  Swallow Initiation : Vallecular space swallow initiation  Nasopharyngeal Reflux : None noted  Pharyngeal Stage : Laryngeal Closure Reduced, Tongue Base Retraction moderately impaired, Pharyngeal Constriction mildly impaired, Mildly reduced laryngeal excursion  Penetration Aspiration Score: 2 - Material enters the airway, remains above the vocal folds, and is ejected from the airway.  Aspiration Volume : None  Esophageal Phase Screening : UES Function WFL    Puree  Oral Stage : WFL  Swallow Initiation : WFL  Nasopharyngeal Reflux: None noted  Pharyngeal Stage : Tongue Base Retraction moderately impaired, Mildly reduced laryngeal excursion, Pharyngeal Constriction mildly impaired Penetration Aspiration Score: 1 - Material does not enter airway  Aspiration Volume : None  Esophageal Phase Screening : UES Function WFL    Solids   Oral Stage : WFL  Swallow Initiation : Base of tongue swallow initiation  Nasopharyngeal Reflux: None noted  Pharyngeal Stage : Tongue Base Retraction moderately impaired, Tongue Base Retraction severely impaired, Pharyngeal Constriction mildly impaired  Penetration Aspiration Score: 1 - Material does not enter airway  Aspiration Volume : None  Esophageal Phase Screening : UES Function WFL    Education Provided: Patient, Compensatory Swallowing Strategies, Safe swallowing strategies    Education Understood: Understanding verbalized     Communication/Consultation: (discussed with ENT)    Session Duration : 30    Today's Charges (noted here with $$):     SLP Evaluations  $$ Motion Fluoro Swallow Eval- Modified [mins]: 30     I attest that I have reviewed the above information.  SignedEliezer Mccoy, SLP  01/19/2018 12:58 PM

## 2018-01-19 NOTE — Unmapped (Signed)
Vedia Pereyra: [phone]    For nursing questions please call Rinaldo Ratel, CMA at [phone] or send a secure message through   [facility] for a faster response.     FAX: [phone]... Attention Cam Harnden    For oncology related questions, call the Nurse Navigator Tehaleh, at [phone]    For surgery scheduling please call Kerrin Mo at [phone]    For scheduling clinic appointments please call [phone]    For appointments or questions about radiology appointments please call [phone]    If you are concerned, do not hesitate to seek medical help at your local emergency department.    After hours, please call [phone] and ask for the ENT physician on call.    Urgent Medical questions: Call Triage at [phone]

## 2018-01-19 NOTE — Unmapped (Signed)
Otolaryngology-Head and Neck Surgery Established Patient Note    Date of Service: January 19, 2018      Assessment:   Diagnosis ICD-10-CM Associated Orders   1. Larynx cancer (CMS-HCC) C32.9 Ambulatory referral to Speech Therapy     FL Barium Swallow Modified   2. 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 The patient completed RT in June 17 2017, but did not complete chemotherapy secondary to side effects.     Now s/p esophageal dilation on 12/23/17. Improved dysphagia since surgery. Patient still having difficulty tolerating capping.       Plan:  - Recommend weaning off of feeding tube as tolerated.   - Will prescribe medrol dose pack to assist with glottic edema. Advised continued use of a humidifier.   - Will refer to SLP for repeat MBS after dilation.   - Recommend that he reach out to his hepatologist or PCP regarding his skin symptoms   - He is not tolerating the passy muir valve currently. I explained I do not think we can take the trach out yet. Continue to use PMV as tolerated. Once tolerating, we can start capping trials.   - Follow up in 6 weeks  - The patient will call with any questions or concerns.       Chief complaint: Laryngeal cancer s/p tracheostomy      History of Present Illness:     Javier Clark is a 58 y.o.  male being seen in consultation for management of laryngeal squamous cell carcinoma. ??He is a 58 year old male who had previously not seen a doctor in 20 years who presented in March for a several month history of worsening dysphagia. ??He had also lost roughly 20 pounds in the past year. ??He has had worsening dyspnea and noisy breathing prior to presentation in March.  ??He has had difficulty sleeping because he is unable to lay flat due to dyspnea. ??He tried his wife's inhaler which did not seem to help much. ??He also admits to subjective chills and night sweats. ??Denies any frank fevers, otalgia, hemoptysis, hematemesis. ??His family states that his voice has changed over the past several weeks. ??Denies any new neck masses. ??    He quit smoking 3 years ago but prior to that smoked 1.5-2 packs/day for decades. ??He also drank roughly a 12 pack a day up until 3 years ago when he quit as well.     He was admitted to the hospital on 04/05/2017 nad underwent awake tracheostomy and direct laryngoscopy/esophagoscopy with biopsies. He has now started CRT here at [facility]. Here for routine eval and trach check. No complaints with trach. Cleaning his inner canula daily. No bleeding from around the site. Is having some oral ulcers from the radiation/chemo. Is planning to get Gtube placed secondary to pain with swallowing.     Update 10/29/2017: The patient's case and PET scan from 09/22/2017 was reviewed at tumor board on 9/13, consensus was that he had a complete response.  The patient returns today for a follow up. Most recent modified barium swallow on 09/22/2017 showed mild oral and mild pharyngeal dysphagia. The patient  recently saw lymphedema therapy on 10/13/2017 and is wondering today if he can do the exercises at home on his own.   Today, the patient states when he swallows, everything comes back up. The patient states he wants to eat everything he sees, but cannot due to difficulty swallowing. He also reports difficulty breathing with the speaking valve in place and complains of thick secretions.   The patient reports he cleans his inner canula regularly.     Update 01/19/2018: The patient returns for follow up s/p esophagoscopy with dilation and direct laryngoscopy on 12/23/17. The patient endorses improving ability to swallow and was recently able to consume a Big Mac. He reports odynophagia when he eats but denied choking.  However, he has continued to use his feeding tube for supplemental nutrition.  The patient also reports difficulty breathing with capping his trach and has not been able to tolerate his Passey muir valve since the dilation.     Operative Findings 12/23/2017:    1.  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   2.  Esophgeal dilation performed via balloon dilation followed by [facility] dilators to starting at 34 and ending at 56.  Successful esophagoscopy completed after dilation.  No abnormal masses or lesions noted on laryngoscopy or esophagoscopy.      Treatment summary:   Dose Site Summary: QQ:[id number]: 06/16/2017: 6,800/7,000 cGy  Did not complete chemotherapy secondary to side effects.   Esophageal dilation 12/23/2017      Past Medical History    Past Medical History:   Diagnosis Date   ??? Hepatitis C    ??? Hypertension    ??? Larynx cancer (CMS-HCC)          Past Surgical History    Past Surgical History:   Procedure Laterality Date   ??? IR INSERT G-TUBE PERCUTANEOUS  05/29/2017    IR INSERT G-TUBE PERCUTANEOUS 05/29/2017 Gwenlyn Fudge, MD IMG VIR H&V [facility]   ??? PR BRONCHOSCOPY,DIAGNOSTIC  04/05/2017    Procedure: Bronchoscopy, Rigid Or Flexible, W/Wo Fluoroscopic Guidance; Diagnostic, With Cell Washing, When Performed;  Surgeon: Biagio Quint, MD;  Location: MAIN OR [facility];  Service: ENT   ??? PR ESOPHAGOSCOPY,DIAGNOSTIC  04/05/2017    Procedure: Esophagoscopy, Rigid Or Flexible; Diagnostic, W/Wo Collection Of Specimen(S) By Brushing Or Washing;  Surgeon: Biagio Quint, MD;  Location: MAIN OR [facility];  Service: ENT   ??? PR ESOPHAGOSCOPY,DIAGNOSTIC N/A 12/23/2017    Procedure: ESOPHAGOSCOPY, RIGID OR FLEXIBLE; DIAGNOSTIC, W/WO COLLECTION OF SPECIMEN(S) BY BRUSHING OR WASHING;  Surgeon: Biagio Quint, MD;  Location: MAIN OR [place];  Service: ENT   ??? PR ESOPHAGOSCOPY,DILATION OVER GUIDE N/A 12/23/2017    Procedure: ESOPHAGOSCOPY, RIGID OR FLEXIBLE; WITH INSERTION OF GUIDE WIRE FOLLOWED BY DILATION OVER GUIDE WIRE;  Surgeon: Biagio Quint, MD;  Location: MAIN OR [facility];  Service: ENT   ??? PR LARYNGOSCOPY,DIRCT,OP,BIOPSY N/A 12/23/2017    Procedure: LARYNGOSCOPY, DIRECT, OPERATIVE, WITH BIOPSY;  Surgeon: Biagio Quint, MD;  Location: MAIN OR [facility];  Service: ENT   ??? PR LARYNGOSCOPY,DIRECT,DIAGNOSTIC N/A 04/05/2017    Procedure: LARYNGOSCOPY DIRECT, WITH OR WITHOUT TRACHEOSCOPY; DIAGNOSTIC, EXCEPT NEWBORN;  Surgeon: Biagio Quint, MD;  Location: MAIN OR [facility];  Service: ENT   ??? PR TRACHEOSTOMY, PLANNED N/A 04/05/2017    Procedure: TRACHEOSTOMY PLANNED (SEPART PROC);  Surgeon: Biagio Quint, MD;  Location: MAIN OR [facility];  Service: ENT   ??? SPINE SURGERY  Medications      Current Outpatient Medications   Medication Sig Dispense Refill   ??? clonazePAM (KLONOPIN) 0.5 MG tablet TAKE 1/2 TABLET BY G-TUBE ROUTE TWICE DAILY AS NEEDED FOR ANXIETY 30 tablet 0   ??? diphenhydrAMINE (BENADRYL) 25 mg capsule Take 1 capsule (25 mg total) by mouth every six (6) hours as needed for itching or allergies. 20 capsule 0   ??? escitalopram oxalate (LEXAPRO) 20 MG tablet Take 1 tablet (20 mg total) by mouth daily. 90 tablet 1   ??? famotidine (PEPCID) 20 MG tablet TAKE 1 TABLET VIA G-TUBE TWICE DAILY 120 each 1   ??? gabapentin (NEURONTIN) 300 MG capsule TAKE 1 CAPSULE THRU GASTROSTOMY TUBE NIGHTLY 90 capsule 3   ??? [START ON 01/22/2018] oxyCODONE (ROXICODONE) 5 MG immediate release tablet Take 1 tablet (5 mg total) by mouth every six (6) hours as needed for pain. 120 tablet 0   ??? prochlorperazine (COMPAZINE) 10 MG tablet Take 1 tablet (10 mg total) by mouth every eight (8) hours as needed for nausea. 60 tablet 6   ??? tamsulosin (FLOMAX) 0.4 mg capsule TAKE 1 CAPSULE BY MOUTH ONCE DAILY 30 capsule 30     No current facility-administered medications for this visit. Allergies    Patient has no known allergies.      Social History:    No longer smoking since cancer diagnosis. Presents today with his wife.   Tobacco use:  reports that he quit smoking about 4 years ago. His smoking use included cigarettes. He has never used smokeless tobacco.  Alcohol use:  reports no history of alcohol use.  Drug use:  reports current drug use. Drug: Marijuana.      Family History    The patient's Family history is unknown by patient..      Review of Systems  A 10 system review of systems was negative except as noted in HPI and intake encounter form, which was reviewed and scanned into the media section of the medical record      Objective     Vital Signs  BP 138/96  - Pulse 95  - Temp 36.8 ??C (98.2 ??F)  - Resp 18  - Ht 167.6 cm (5' 6)  - Wt 70.9 kg (156 lb 6.4 oz)  - SpO2 96%  - BMI 25.24 kg/m??       Physical Exam    Vitals signs reviewed in the nursing chart  General:  WD, WN, well groomed, pleasant male, sitting up in NAD, normal appearance. Able to produce raspy voice with finger occlusion.   Psychiatric/Neuro:  A&Ox3, Normal mood and affect, Following commands, MAE. Cranial nerves 2-12 intact, No focal deficits.   Eyes:  EOM Intact, sclera anicteric, no conjunctival injection, PERRL.   Oral Cavity:  Normal lips, gingiva, tongue, hard palate; pink, moist mucosa, no lesions, tongue is mobile.  Floor of mouth is soft.  Oropharynx:  No masses or lesions.  BOT soft, normal tonsils, soft palate and posterior pharynx, symmetric pink, moist mucosa  Neck: # 6 Shiley in place, some submental fibrosis 2/2 to radiation. No masses.   Lymphatics:   postradiation changes, no LAD  Respiratory:  Normal respiratory effort, symmetric chest rise, no accessory muscle usage.    Laryngoscopy  Procedure Note    Pre-operative Diagnosis:    Diagnosis ICD-10-CM Associated Orders   1. 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 Dysphagia, unspecified type R13.10 Ambulatory referral to Speech Therapy     FL Barium Swallow Modified       Post-operative Diagnosis: same    Anesthesia: Lidocaine 4% and Oxymetazoline 0.05%    Surgeon: Cheryle Horsfall, MD    Endoscopy Type:  Flexible Fiberoptic Laryngoscopy   Laryngoscope G (serial number [id number]) was used during this visit on January 19, 2018.    Indications: To better evaluate the patient???s tumor site and evaluate treatment response    Procedure Details:    The patient was placed in the sitting position.  After topical anesthesia and decongestion, the 4 mm laryngoscope was passed.  The nasal cavities, nasopharynx, oropharynx, hypopharynx, and larynx were all examined.  Vocal cords were examined during respiration and phonation.  The following findings were noted:    Findings:  Nasal cavities:  Normal mucosa, patent, no masses, lesions, normal turbinates and septum, mucosa pink and moist.  Nasopharynx:  Normal pink moist mucosa, normal eustachian tubes, no masses.  Oropharynx:  Normal palate, tonsils, pharyngeal wall, and base of tongue, no masses, mucosa pink and moist  Hypopharynx: Normal pharyngeal walls and pyriform sinuses, some pooling of secretions.  Larynx:  Edema of glottis including the TVC. No masses. Excursion of VC on inspiration. Able to visualize subglottis.     Condition:  Stable.  Patient tolerated procedure well.    Complications:  None                  Imaging Results:  I have personally reviewed all pertinent imaging results.    12/23/17 XR Chest:  IMPRESSION:  ??  No acute airspace disease.  ??  No acute displaced fracture.      09/22/2017 PET-CT:  IMPRESSION:  Left lower lobe solid nodule with mild avidity concerning for metastatic disease.  ??  Right upper lobe spiculated nodule decreased in size, with mild avidity likely resolving aspiration pneumonitis, however cannot rule out malignancy. Attention on follow-up  ??  Resolving areas of aspiration pneumonitis seen in the right lower lobe, right middle lobe, and lingula without significant avidity.  ??  Previously noted right hydroureteronephrosis and ureteral calculus have resolved.  ??  Stable appearing para-aortic/peripancreatic retroperitoneal adenopathy and stranding without avidity. Likely chronic reactive/inflammatory changes without identifiable etiology, attention on follow-up.    09/22/2017 FL Barium Swallow Modified:   IMPRESSION:  ??  1.Penetration of the laryngeal vestibule ultrasound consistencies.  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  ??  Please see full speech and language pathology note for further evaluation.      Scribe's Attestation: Cheryle Horsfall, MD obtained and performed the history, physical exam and medical decision making elements that were entered into the chart. Signed by Kittie Plater, Scribe, on January 19, 2018 at 9:24 AM.    ----------------------------------------------------------------------------------------------------------------------  January 22, 2018 12:43 PM. Documentation assistance provided by the Scribe. I was present during the time the encounter was recorded. The information recorded by the Scribe was done at my direction and has been reviewed and validated by me.  ----------------------------------------------------------------------------------------------------------------------

## 2018-01-22 MED ORDER — OXYCODONE 5 MG TABLET
ORAL_TABLET | Freq: Four times a day (QID) | ORAL | 0 refills | 0 days | Status: CP | PRN
Start: 2018-01-22 — End: 2018-02-17

## 2018-01-23 NOTE — Unmapped (Signed)
PA approved through 01/14/2019.  

## 2018-01-23 NOTE — Unmapped (Signed)
PA Oxycodone initiated electronically

## 2018-01-26 NOTE — Unmapped (Signed)
Laryngoscope serial number [id number] was used during this visit on 01-19-18

## 2018-02-11 NOTE — Unmapped (Signed)
This patient's last AWV date: [facility] Last Medicare Wellness Visit Date: Not Found  Abstraction Result Flowsheet Data    Reason for Encounter  Reason for Encounter: Outreach  Primary Reason for Call: AWV  Scheduling Outcome: Left message  Upfront: No

## 2018-02-13 NOTE — Unmapped (Signed)
This patient's last AWV date: [facility] Last Medicare Wellness Visit Date: Not Found  Patient AWV scheduled for 02/06 with Wolfgang Phoenix, RN  Abstraction Result Flowsheet Data    Reason for Encounter  Reason for Encounter: Outreach  Primary Reason for Call: AWV  Scheduling Outcome: Scheduled  Upfront: No     Colorectal Cancer Screening Gap  Colorectal Cancer Screening Gap Reviewed: Yes  Gap Closed? : No  If No, Result Details: No - Patient Declined

## 2018-02-18 MED ORDER — OXYCODONE 5 MG TABLET
ORAL_TABLET | Freq: Four times a day (QID) | ORAL | 0 refills | 0 days | Status: CP | PRN
Start: 2018-02-18 — End: 2018-03-16

## 2018-02-19 ENCOUNTER — Ambulatory Visit: Admit: 2018-02-19 | Discharge: 2018-02-20 | Payer: MEDICARE

## 2018-02-19 DIAGNOSIS — Z0000 Encounter for general adult medical examination without abnormal findings: Principal | ICD-10-CM

## 2018-02-19 DIAGNOSIS — Z7189 Other specified counseling: Secondary | ICD-10-CM

## 2018-02-19 NOTE — Unmapped (Signed)
P/C requesting refill on Oxycodone , last ordered on 01/15/2018, last visit 08/28/17.No contract or UDS.

## 2018-02-19 NOTE — Unmapped (Signed)
This auto-generated note displays abnormal results identified during the AWV Assessments. For full results, please see the Flowsheet Links under the Additional Documentation section of this encounter in Chart Review.      Patient presents today for subsequent annual wellness visit with RN care manager, see abnormal assessment findings below.     General Health:  Patient report his health status as good, hx of larynx cancer, s/p trach, and G-tube placement for medications and nutritional supplementation (ensure) patient states he going to continue to do everything he can to better his health, states trach is major source of pain and hopes he will not need it in the near future. notes then he can move forward to hep c treatment.      Denies any barriers to care, but does inquire as to whether or not his supplements could be covered by his insurance if he had a prescription.  CM will research and follow up with patient accordingly.     Pain identified during today's visit        02/19/18 1024   PainSc:   8    Patient pain medication managed by ENT ON[facility], notes trach is source of pain, states baseline is 8.     Safety:  No concerns identified today.       Psychosocial Assessment:  No concerns identified today.     Barriers to Care From History:  Caregiver burden No   Cognitive Impairment No   Falls Risk No   Financial difficulty No   Frail Elderly No   Hearing impairment/loss No   Homeless No   Impaired mobility No   Inadequate social/family support No   Ineffective family coping No   Low Literacy No   Nonadherence to medication No   Non-english speaking No   Terminal Illness/Hospice No   Transportation barriers No   Visual impairment No       Here is your personalized prevention plan based on your Annual Wellness Visit today.    Medicare Screening & Prevention Guidelines Recommendations Last Date Completed HM Status and Next Due Follow-Up   AAA Ultrasound Once in men age 34 to 62 who have ever smoked or currently smoke. AAA screening date: Not Found Health Maintenance Summary     Patient has no health maintenance due at this time       Not within age range   Colorectal Cancer Screening Patients 50 to 75: stool cards annually OR colonoscopy every 10 years (or more frequently if high risk) OR FIT-DNA every 3 years.  Colonoscopy date: Not Found  FOBT/FIT date: Not Found  Sigmoidoscopy date: Not Found  FIT-DNA date: Not Found Health Maintenance Summary       Status Date      FOBT/FIT Overdue 05/11/2009     Colonoscopy Overdue 05/11/2009        Patient declined - other will consider cologuard   DEXA Bone Density Measurement Patients age 69-85 to have a DEXA every 5 years in postmenopausal women, males will defer to PCP. DEXA date: Not Found Health Maintenance Summary     Patient has no health maintenance due at this time       Not applicable   Heart Disease Screening (fasting lipid panel) Minimum of every 5 years, patients age 74-75,  if no apparent signs or symptoms of heart disease. LDL date: Not Found  Total choleseterol date: Not Found  HDL date: Not Found  Triglycerides date: Not Found Health Maintenance Summary  Status Date      Lipid Screening Overdue 05/12/1999        Up to date   Hepatitis C Screening A one-time screening for HCV infection for adults born between [birth year] & 1965. HCV screening date: 09/22/2017 Health Maintenance Summary     Patient has no health maintenance due at this time       Complete   Tdap Every 10 years (will not be covered by Medicare) DTap/Tdap/TD vaccination: Not Found Health Maintenance Summary       Status Date      DTaP/Tdap/Td Vaccines Overdue 05/12/1970      Up to date per patient report and Provided information on how to obtain at pharmacy    Influenza Vaccine Annually  Influenza vaccination: 10/09/2017   Health Maintenance Summary       Status Date      Influenza Vaccine This plan is no longer active.      Done 10/09/2017 Imm Admin: Influenza Vaccine Quad (IIV4 PF) 1mo+   injectable     Patient has more history with this topic...       Up to date   Prevnar and Pneumovax Vaccines Prevnar given at age 72 and Pneumovax given one year later. These vaccines may be given in a different sequence depending on chronic conditions. (utilize BPA for dosing & administration) Pneumonia vaccination: 10/09/2017   Health Maintenance Summary       Status Date      Pneumococcal Vaccine This plan is no longer active.      Done 10/09/2017 Imm Admin: PNEUMOCOCCAL POLYSACCHARIDE 23       Up to date   Zoster Vaccine Once at age 39 or older (may not be covered by Medicare).  Zoster vaccination: Not Found Health Maintenance Summary       Status Date      Zoster Vaccines Overdue 05/11/2009      Not within age range   Diabetes Screening  Annually for patients 40-70, if risk factors (family hx of DM, hx of gestational DM, and/or PCOS), twice per year if diagnosed with pre-diabetes.    Range 65-99 Diabetes screening date: Not Found N/A Up to date

## 2018-02-19 NOTE — Unmapped (Signed)
Thank you for completing your Medicare Annual Wellness Visit today. If you have any questions about your Medicare Annual Wellness Visit please call our Care Manager, Jhordan Mckibben RN, CCM at [facility] at [facility] at [phone]. Please see below your updated Personal Prevention Plan which details the status of your preventative services, agreed upon goals to improve your fitness and health, and educational materials on health topics specific to you.     Here is your personalized prevention plan based on your Annual Wellness Visit today.    Medicare Screening & Prevention Guidelines Recommendations Last Date Completed HM Status and Next Due Follow-Up   AAA Ultrasound Once in men age 1 to 23 who have ever smoked or currently smoke. AAA screening date: Not Found Health Maintenance Summary     Patient has no health maintenance due at this time      ?? Not within age range   Colorectal Cancer Screening Patients 50 to 75: stool cards annually OR colonoscopy every 10 years (or more frequently if high risk) OR FIT-DNA every 3 years.  Colonoscopy date: Not Found  FOBT/FIT date: Not Found  Sigmoidoscopy date: Not Found  FIT-DNA date: Not Found Health Maintenance Summary       Status Date      FOBT/FIT Overdue 05/11/2009     Colonoscopy Overdue 05/11/2009       ?? Patient declined - other will consider cologuard   DEXA Bone Density Measurement Patients age 60-85 to have a DEXA every 5 years in postmenopausal women, males will defer to PCP. DEXA date: Not Found Health Maintenance Summary     Patient has no health maintenance due at this time      ?? Not applicable   Heart Disease Screening (fasting lipid panel) Minimum of every 5 years, patients age 35-75,?? if no apparent signs or symptoms of heart disease. LDL date: Not Found  Total choleseterol date: Not Found  HDL date: Not Found  Triglycerides date: Not Found Health Maintenance Summary       Status Date      Lipid Screening Overdue 05/12/1999       ?? Up to date Hepatitis C Screening A one-time screening for HCV infection for adults born between [birth year] & 1965. HCV screening date: 09/22/2017 Health Maintenance Summary     Patient has no health maintenance due at this time      ?? Complete   Tdap Every 10 years (will not be covered by Medicare) DTap/Tdap/TD vaccination: Not Found Health Maintenance Summary       Status Date      DTaP/Tdap/Td Vaccines Overdue 05/12/1970      Up to date per pt report   Influenza Vaccine Annually  Influenza vaccination: 10/09/2017  ?? Health Maintenance Summary       Status Date      Influenza Vaccine This plan is no longer active.      Done 10/09/2017 Imm Admin: Influenza Vaccine Quad (IIV4 PF) 72mo+   injectable     Patient has more history with this topic...      ?? Up to date   Prevnar and Pneumovax Vaccines Prevnar given at age 10 and Pneumovax given one year later. These vaccines may be given in a different sequence depending on chronic conditions. (utilize BPA for dosing & administration) Pneumonia vaccination: 10/09/2017  ?? Health Maintenance Summary       Status Date      Pneumococcal Vaccine This plan is no longer  active.      Done 10/09/2017 Imm Admin: PNEUMOCOCCAL POLYSACCHARIDE 23      ?? Up to date   Zoster Vaccine Once at age 61 or older (may not be covered by Medicare).  Zoster vaccination: Not Found Health Maintenance Summary       Status Date      Zoster Vaccines Overdue 05/11/2009      Not within age range   Diabetes Screening  Annually for patients 40-70, if risk factors (family hx of DM, hx of gestational DM, and/or PCOS), twice per year if diagnosed with pre-diabetes.  ??  Range 65-99 Diabetes screening date: Not Found N/A Up to date   ??  Patient Education        Advance Care Planning: Care Instructions  Your Care Instructions    It can be hard to live with an illness that cannot be cured. But if your health is getting worse, you may want to make decisions about end-of-life care. Planning for the end of your life does not mean that you are giving up. It is a way to make sure that your wishes are met. Clearly stating your wishes can make it easier for your loved ones. Making plans while you are still able may also ease your mind and make your final days less stressful and more meaningful.  Follow-up care is a key part of your treatment and safety. Be sure to make and go to all appointments, and call your doctor if you are having problems. It's also a good idea to know your test results and keep a list of the medicines you take.  What can you do to plan for the end of life?  ?? You can bring these issues up with your doctor. You do not need to wait until your doctor starts the conversation. You might start with I would not be willing to live with .... When you complete this sentence it helps your doctor understand your wishes.  ?? Talk openly and honestly with your doctor. This is the best way to understand the decisions you will need to make as your health changes. Know that you can always change your mind.  ?? Ask your doctor about commonly used life-support measures. These include tube feedings, breathing machines, and fluids given through a vein (IV). Understanding these treatments will help you decide whether you want them.  ?? You may choose to have these life-supporting treatments for a limited time. This allows a trial period to see whether they will help you. You may also decide that you want your doctor to take only certain measures to keep you alive. It is important to spell out these conditions so that your doctor and family understand them.  ?? Talk to your doctor about how long you are likely to live. He or she may be able to give you an idea of what usually happens with your specific illness.  ?? Think about preparing papers that state your wishes. This way there will not be any confusion about what you want. You can change your instructions at any time.  Which papers should you prepare?  Advance directives are legal papers that tell doctors how you want to be cared for at the end of your life. You do not need a lawyer to write these papers. Ask your doctor or your state health department for information on how to write your advance directives. They may have the forms for each of these types of papers. Make sure  your doctor has a copy of these on file, and give a copy to a family member or close friend.  ?? Consider a do-not-resuscitate order (DNR). This order asks that no extra treatments be done if your heart stops or you stop breathing. Extra treatments may include cardiopulmonary resuscitation (CPR), electrical shock to restart your heart, or a machine to breathe for you. If you decide to have a DNR order, ask your doctor to explain and write it. Place the order in your home where everyone can easily see it.  ?? Consider a living will. A living will explains your wishes about life support and other treatments at the end of your life if you become unable to speak for yourself. Living wills tell doctors to use or not use treatments that would keep you alive. You must have one or two witnesses or a notary present when you sign this form.  ?? Consider a durable power of attorney for health care. This allows you to name a person to make decisions about your care if you are not able to. Most people ask a close friend or family member. Talk to this person about the kinds of treatments you want and those that you do not want. Make sure this person understands your wishes.  These legal papers are simple to change. Tell your doctor what you want to change, and ask him or her to make a note in your medical file. Give your family updated copies of the papers.  Where can you learn more?  Go to [facility] at https://myuncchart.org  [facility] under [employer]. Enter P184 in the search box to learn more about Advance Care Planning: Care Instructions.  Current as of: April 14, 2017  Content Version: 12.3  ?? 2006-2019 [employer]. Care instructions adapted under license by [facility]. If you have questions about a medical condition or this instruction, always ask your healthcare professional. [employer] Inc[employer] disclaims any warranty or liability for your use of this information.

## 2018-02-19 NOTE — Unmapped (Signed)
Pt needs Controlled Substance f/u , last visit 08/2017. Needs contract and UDS

## 2018-02-20 NOTE — Unmapped (Signed)
ADVANCE CARE PLANNING NOTE    Discussion Date:  February 19, 2018    Patient has decisional capacity:  Yes    Patient has selected a Health Care Decision-Maker if loses capacity: Yes    Health Care Decision Maker as of 02/19/2018    HCDM, First Alternate: Bui,Norma - Daughter - [phone]    HCDM, Second Alternate: Jona, Zappone - Spouse - [phone]    Discussion Participants:  Patient and RN care manager      Communication of Medical Status/Prognosis:   Patient reports health status as good, hx of larynx cancer, s/p trach, chronic pain, and Hep C    Communication of Treatment Goals/Options:   Confirmed and updated HCDM, reviewed, discussed and provided a copy of [place] Practical Form to patient, he expressed interest in completing form, advised that if/when he completes the document, it can be scanned here in the clinic and uploaded into his chart, additional advance care planning information included in the AVS.    Treatment Decisions:   Patient received copy of [place] Practical Form, updated and confirmed HCDM.    I spent between 16-45 minutes providing voluntary advance care planning services for this patient.    02/19/2018    MARK W HEFFINGTON, MD was present and immediately available in office suite.    The patient reports interest in education materials about a Healthcare power of attorney Living Will [place] Practical Advance Form. Materials provided at today's visit. .      Case Manager facilitated discussion with patient about advance care planning and documentation including Healthcare power of attorney Living Will [place] Practical Advance Form . The patient voluntarily agreed to complete Healthcare power of attorney Living Will  Practical Advance Form.   Note: forms that require notarization require two witnessess that are non-family members nor health care workers.

## 2018-02-27 MED ORDER — TAMSULOSIN 0.4 MG CAPSULE
ORAL_CAPSULE | Freq: Every evening | ORAL | 1 refills | 0 days | Status: CP
Start: 2018-02-27 — End: 2019-02-27

## 2018-02-27 MED ORDER — FAMOTIDINE 20 MG TABLET
ORAL_TABLET | Freq: Two times a day (BID) | ORAL | 1 refills | 0.00000 days | Status: CP
Start: 2018-02-27 — End: 2019-02-27

## 2018-02-27 MED ORDER — GABAPENTIN 300 MG CAPSULE
ORAL_CAPSULE | Freq: Every evening | ORAL | 1 refills | 0 days | Status: CP
Start: 2018-02-27 — End: 2019-02-27

## 2018-02-27 MED ORDER — PROCHLORPERAZINE MALEATE 10 MG TABLET
ORAL_TABLET | Freq: Three times a day (TID) | ORAL | 1 refills | 0.00000 days | Status: CP | PRN
Start: 2018-02-27 — End: 2019-02-27

## 2018-02-27 NOTE — Unmapped (Signed)
During annual wellness visit patient expressed concern over continuing to afford Ensure supplements, after consulting with nutritionist in clinic, CM contacted patient and advised that a cheaper alternative to ensure would be milk (whatever his preference) mixed with carnation instant breakfast packet.  Patient likes this idea, notes he doesn't typically drink milk due to the phlegm created, advised patient that CM understood that supplements were going into his G-tube, patient states that is an option but can also try a milk alternative with the powder.       Advised patient that CM had spoke with PCP regarding reported itching and his use of benedryl, advised that provider states it is ok to use the benedryl for itching, however states he would be agreeable to prescribe something different if the benedryl was causing to much drowsiness, patient reports he does experience drowsiness with the Benedryl and states that it is not as effective for the itching as he would like.  He request something for itching to be sent to [facility].      Forwarding to PCP for review.

## 2018-03-08 DIAGNOSIS — R109 Unspecified abdominal pain: Secondary | ICD-10-CM | POA: Diagnosis not present

## 2018-03-08 DIAGNOSIS — Z5111 Encounter for antineoplastic chemotherapy: Secondary | ICD-10-CM | POA: Diagnosis not present

## 2018-03-08 DIAGNOSIS — C3402 Malignant neoplasm of left main bronchus: Secondary | ICD-10-CM | POA: Diagnosis not present

## 2018-03-08 DIAGNOSIS — M4802 Spinal stenosis, cervical region: Secondary | ICD-10-CM | POA: Diagnosis not present

## 2018-03-08 DIAGNOSIS — C329 Malignant neoplasm of larynx, unspecified: Secondary | ICD-10-CM | POA: Diagnosis not present

## 2018-03-08 DIAGNOSIS — R112 Nausea with vomiting, unspecified: Secondary | ICD-10-CM | POA: Diagnosis not present

## 2018-03-08 DIAGNOSIS — G629 Polyneuropathy, unspecified: Secondary | ICD-10-CM | POA: Diagnosis not present

## 2018-03-08 DIAGNOSIS — I10 Essential (primary) hypertension: Secondary | ICD-10-CM | POA: Diagnosis not present

## 2018-03-08 DIAGNOSIS — C3432 Malignant neoplasm of lower lobe, left bronchus or lung: Secondary | ICD-10-CM | POA: Diagnosis not present

## 2018-03-11 ENCOUNTER — Ambulatory Visit: Admit: 2018-03-11 | Discharge: 2018-03-12 | Payer: MEDICARE

## 2018-03-11 DIAGNOSIS — Z930 Tracheostomy status: Secondary | ICD-10-CM

## 2018-03-11 DIAGNOSIS — C329 Malignant neoplasm of larynx, unspecified: Principal | ICD-10-CM

## 2018-03-11 DIAGNOSIS — Z9221 Personal history of antineoplastic chemotherapy: Secondary | ICD-10-CM | POA: Diagnosis not present

## 2018-03-11 DIAGNOSIS — Z931 Gastrostomy status: Secondary | ICD-10-CM | POA: Diagnosis not present

## 2018-03-11 DIAGNOSIS — Z08 Encounter for follow-up examination after completed treatment for malignant neoplasm: Secondary | ICD-10-CM | POA: Diagnosis not present

## 2018-03-11 DIAGNOSIS — Z8521 Personal history of malignant neoplasm of larynx: Secondary | ICD-10-CM | POA: Diagnosis not present

## 2018-03-11 DIAGNOSIS — Z87891 Personal history of nicotine dependence: Secondary | ICD-10-CM | POA: Diagnosis not present

## 2018-03-11 DIAGNOSIS — Z923 Personal history of irradiation: Secondary | ICD-10-CM | POA: Diagnosis not present

## 2018-03-11 DIAGNOSIS — I10 Essential (primary) hypertension: Secondary | ICD-10-CM | POA: Diagnosis not present

## 2018-03-11 DIAGNOSIS — R0981 Nasal congestion: Secondary | ICD-10-CM | POA: Diagnosis not present

## 2018-03-11 MED ORDER — FLUTICASONE PROPIONATE 50 MCG/ACTUATION NASAL SPRAY,SUSPENSION
Freq: Two times a day (BID) | NASAL | 6 refills | 0 days | Status: CP
Start: 2018-03-11 — End: 2021-09-06

## 2018-03-11 NOTE — Unmapped (Signed)
Vedia Pereyra: [phone]    For nursing questions please call Corrin Parker, RN BSN or Myles Rosenthal, LPN at [phone] or send a secure message through   Your [facility] for a faster response.     FAX: [phone]... Attention: Jontae Adebayo    For oncology related questions, call the Nurse Navigator [facility], at [phone]    ? For surgery scheduling please call Kerrin Mo at [phone]    For scheduling clinic appointments please call [phone]    For appointments or questions about radiology appointments please call [phone]    If you are concerned, please do not hesitate to seek medical care at your local emergency department.    After hours, please call [phone] and ask for the ENT physician on call.

## 2018-03-11 NOTE — Unmapped (Signed)
Otolaryngology-Head and Neck Surgery Established Patient Note    Date of Service: March 11, 2018      Assessment:   Diagnosis ICD-10-CM Associated Orders   1. Larynx cancer (CMS-HCC) C32.9    2. 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 The patient completed RT in June 17 2017, but did not complete chemotherapy secondary to side effects.     Now s/p esophageal dilation on 12/23/17.  Patient still having difficulty tolerating capping.       Plan:  - NED- 8 months out from treatment.   - Patient had MBS on 01/19/18. SLP has recommended continued regular diet and thin liquids. I strongly encouraged him to advance to solely PO diet as tolerated  - He is still not tolerating the passy muir valve. I encouraged him to try 20 minute PMV trials as tolerated. I explained I do not think we can take the trach out yet.  - Will start him on Flonase to see if this helps with congestion.   - F/u with me in 6 weeks  - The patient will call with any questions or concerns.       Chief complaint: Laryngeal cancer s/p tracheostomy      History of Present Illness:     Javier Clark is a 58 y.o.  male being seen in consultation for management of laryngeal squamous cell carcinoma. ??He is a 58 year old male who had previously not seen a doctor in 20 years who presented in March for a several month history of worsening dysphagia. ??He had also lost roughly 20 pounds in the past year. ??He has had worsening dyspnea and noisy breathing prior to presentation in March.  ??He has had difficulty sleeping because he is unable to lay flat due to dyspnea. ??He tried his wife's inhaler which did not seem to help much. ??He also admits to subjective chills and night sweats. ??Denies any frank fevers, otalgia, hemoptysis, hematemesis. ??His family states that his voice has changed over the past several weeks. ??Denies any new neck masses. ??    He quit smoking 3 years ago but prior to that smoked 1.5-2 packs/day for decades. ??He also drank roughly a 12 pack a day up until 3 years ago when he quit as well.     He was admitted to the hospital on 04/05/2017 nad underwent awake tracheostomy and direct laryngoscopy/esophagoscopy with biopsies. He has now started CRT here at [facility]. Here for routine eval and trach check. No complaints with trach. Cleaning his inner canula daily. No bleeding from around the site. Is having some oral ulcers from the radiation/chemo. Is planning to get Gtube placed secondary to pain with swallowing.     Update 10/29/2017: The patient's case and PET scan from 09/22/2017 was reviewed at tumor board on 9/13, consensus was that he had a complete response.  The patient returns today for a follow up. Most recent modified barium swallow on 09/22/2017 showed mild oral and mild pharyngeal dysphagia. The patient recently saw lymphedema therapy on 10/13/2017 and is wondering today if he can do the exercises at home on his own.   Today, the patient states when he swallows, everything comes back up. The patient states he wants to eat everything he sees, but cannot due to difficulty swallowing. He also reports difficulty breathing  with the speaking valve in place and complains of thick secretions.   The patient reports he cleans his inner canula regularly.     Update 01/19/2018: The patient returns for follow up s/p esophagoscopy with dilation and direct laryngoscopy on 12/23/17. The patient endorses improving ability to swallow and was recently able to consume a Big Mac. He reports odynophagia when he eats but denied choking.  However, he has continued to use his feeding tube for supplemental nutrition.  The patient also reports difficulty breathing with capping his trach and has not been able to tolerate his Passey muir valve since the dilation. Update 03/11/18: The patient returns for follow up. He continues to endorse pain when eating by mouth. When he swallows, it feels like he has to force the food down. The patient reports he eats at least one meal a day by mouth but the rest through his g-tube. The patient reports recent weight gain. He continues to endorse difficulty breathing with capping his trach and has not been able to tolerate his [employer] valve. The patient reports some baseline difficulty breathing ever since a nose injury as a teenager.      He is not currently smoking.       Treatment summary:   Dose Site Summary: UJ:WJXBJY: 06/16/2017: 6,800/7,000 cGy  Did not complete chemotherapy secondary to side effects.   Esophageal dilation 12/23/2017      Past Medical History    Past Medical History:   Diagnosis Date   ??? Hepatitis C    ??? Hypertension    ??? Larynx cancer (CMS-HCC)          Past Surgical History    Past Surgical History:   Procedure Laterality Date   ??? IR INSERT G-TUBE PERCUTANEOUS  05/29/2017    IR INSERT G-TUBE PERCUTANEOUS 05/29/2017 Gwenlyn Fudge, MD IMG VIR [facility]   ??? PR BRONCHOSCOPY,DIAGNOSTIC  04/05/2017    Procedure: Bronchoscopy, Rigid Or Flexible, W/Wo Fluoroscopic Guidance; Diagnostic, With Cell Washing, When Performed;  Surgeon: Biagio Quint, MD;  Location: MAIN OR [facility];  Service: ENT   ??? PR ESOPHAGOSCOPY,DIAGNOSTIC  04/05/2017    Procedure: Esophagoscopy, Rigid Or Flexible; Diagnostic, W/Wo Collection Of Specimen(S) By Brushing Or Washing;  Surgeon: Biagio Quint, MD;  Location: MAIN OR [facility];  Service: ENT   ??? PR ESOPHAGOSCOPY,DIAGNOSTIC N/A 12/23/2017    Procedure: ESOPHAGOSCOPY, RIGID OR FLEXIBLE; DIAGNOSTIC, W/WO COLLECTION OF SPECIMEN(S) BY BRUSHING OR WASHING;  Surgeon: Biagio Quint, MD;  Location: MAIN OR [place];  Service: ENT   ??? PR ESOPHAGOSCOPY,DILATION OVER GUIDE N/A 12/23/2017    Procedure: ESOPHAGOSCOPY, RIGID OR FLEXIBLE; WITH INSERTION OF GUIDE WIRE FOLLOWED BY DILATION OVER GUIDE WIRE;  Surgeon: Biagio Quint, MD;  Location: MAIN OR [facility];  Service: ENT   ??? PR LARYNGOSCOPY,DIRCT,OP,BIOPSY N/A 12/23/2017    Procedure: LARYNGOSCOPY, DIRECT, OPERATIVE, WITH BIOPSY;  Surgeon: Biagio Quint, MD;  Location: MAIN OR [facility];  Service: ENT   ??? PR LARYNGOSCOPY,DIRECT,DIAGNOSTIC N/A 04/05/2017    Procedure: LARYNGOSCOPY DIRECT, WITH OR WITHOUT TRACHEOSCOPY; DIAGNOSTIC, EXCEPT NEWBORN;  Surgeon: Biagio Quint, MD;  Location: MAIN OR [facility];  Service: ENT   ??? PR TRACHEOSTOMY, PLANNED N/A 04/05/2017    Procedure: TRACHEOSTOMY PLANNED (SEPART PROC);  Surgeon: Biagio Quint, MD;  Location: MAIN OR [facility];  Service: ENT   ??? SPINE SURGERY           Medications      Current Outpatient Medications   Medication Sig Dispense  Refill   ??? clonazePAM (KLONOPIN) 0.5 MG tablet TAKE 1/2 TABLET BY G-TUBE ROUTE TWICE DAILY AS NEEDED FOR ANXIETY 30 tablet 0   ??? diphenhydrAMINE (BENADRYL) 25 mg capsule Take 1 capsule (25 mg total) by mouth every six (6) hours as needed for itching or allergies. 20 capsule 0   ??? escitalopram oxalate (LEXAPRO) 20 MG tablet Take 1 tablet (20 mg total) by mouth daily. 90 tablet 1   ??? famotidine (PEPCID) 20 MG tablet Take 1 tablet (20 mg total) by mouth Two (2) times a day. 180 tablet 1   ??? gabapentin (NEURONTIN) 300 MG capsule Take 1 capsule (300 mg total) by mouth nightly. 90 capsule 1   ??? methylPREDNISolone (MEDROL DOSEPACK) 4 mg tablet Take 1 tablet (4 mg total) by mouth daily. follow package directions 1 Package 0   ??? oxyCODONE (ROXICODONE) 5 MG immediate release tablet Take 1 tablet (5 mg total) by mouth every six (6) hours as needed for pain. 120 tablet 0   ??? prochlorperazine (COMPAZINE) 10 MG tablet Take 1 tablet (10 mg total) by mouth every eight (8) hours as needed for nausea. 180 tablet 1   ??? tamsulosin (FLOMAX) 0.4 mg capsule Take 1 capsule (0.4 mg total) by mouth nightly. 90 capsule 1     No current facility-administered medications for this visit. Allergies    Patient has no known allergies.      Social History:    No longer smoking since cancer diagnosis. Presents today with his wife.   Tobacco use:  reports that he quit smoking about 4 years ago. His smoking use included cigarettes. He has never used smokeless tobacco.  Alcohol use:  reports no history of alcohol use.  Drug use:  reports current drug use. Drug: Marijuana.      Family History    The patient's Family history is unknown by patient..      Review of Systems  A 10 system review of systems was negative except as noted in HPI and intake encounter form, which was reviewed and scanned into the media section of the medical record      Objective     Vital Signs  BP 156/96  - Pulse 77  - Temp 37.1 ??C (98.7 ??F)  - Resp 18  - Ht 165.1 cm (5' 5)  - Wt 73.9 kg (163 lb)  - SpO2 95%  - BMI 27.12 kg/m??       Physical Exam    Vitals signs reviewed in the nursing chart  General:  WD, WN, well groomed, pleasant male, sitting up in NAD, normal appearance. Able to produce raspy voice with finger occlusion.   Psychiatric/Neuro:  A&Ox3, Normal mood and affect, Following commands, MAE. Cranial nerves 2-12 intact, No focal deficits.   Eyes:  EOM Intact, sclera anicteric, no conjunctival injection, PERRL.   Oral Cavity:  Normal lips, gingiva, tongue, hard palate; pink, moist mucosa, no lesions, tongue is mobile.  Floor of mouth is soft.  Oropharynx:  No masses or lesions.  BOT soft, normal tonsils, soft palate and posterior pharynx, symmetric pink, moist mucosa  Neck: # 6 Shiley in place, some fibrosis in the submental area, worse on the left. No masses.   Lymphatics:   postradiation changes, no LAD      Laryngoscopy  Procedure Note    Pre-operative Diagnosis:    Diagnosis ICD-10-CM Associated Orders   1. Larynx cancer (CMS-HCC) C32.9    2. Tracheostomy dependence (CMS-HCC) Z93.0  Post-operative Diagnosis: same    Anesthesia: Lidocaine 4% and Oxymetazoline 0.05%    Surgeon: Cheryle Horsfall, MD    Endoscopy Type:  Flexible Fiberoptic Laryngoscopy   Scope with serial number [id number] was used today on 03/11/18.     Indications: To better evaluate the patient???s tumor site and evaluate treatment response    Procedure Details:    The patient was placed in the sitting position.  After topical anesthesia and decongestion, the 4 mm laryngoscope was passed.  The nasal cavities, nasopharynx, oropharynx, hypopharynx, and larynx were all examined.  Vocal cords were examined during respiration and phonation.  The following findings were noted:    Findings:  Nasal cavities:  Normal mucosa, patent, no masses, lesions, normal turbinates and septum, mucosa pink and moist.  Nasopharynx:  Normal pink moist mucosa, normal eustachian tubes, no masses.  Oropharynx:  Normal palate, tonsils, pharyngeal wall, and base of tongue, no masses, mucosa pink and moist  Hypopharynx: Normal pharyngeal walls and pyriform sinuses, some pooling of secretions.  Larynx:  Postradiation changed with ectasias and edema of false and true cords. There is bilateral VC movement. Subglottis is patent in inspiration.                         Condition:  Stable.  Patient tolerated procedure well.    Complications:  None        Imaging Results:  I have personally reviewed all pertinent imaging results.      09/22/2017 PET-CT:  IMPRESSION:  Left lower lobe solid nodule with mild avidity concerning for metastatic disease.  ??  Right upper lobe spiculated nodule decreased in size, with mild avidity likely resolving aspiration pneumonitis, however cannot rule out malignancy. Attention on follow-up  ??  Resolving areas of aspiration pneumonitis seen in the right lower lobe, right middle lobe, and lingula without significant avidity.  ??  Previously noted right hydroureteronephrosis and ureteral calculus have resolved.  ??  Stable appearing para-aortic/peripancreatic retroperitoneal adenopathy and stranding without avidity. Likely chronic reactive/inflammatory changes without identifiable etiology, attention on follow-up.      12/23/17 XR Chest:  IMPRESSION:  ??  No acute airspace disease.  ??  No acute displaced fracture.      01/19/2018 FL Barium Swallow Modified:   FINDINGS:   ??  Tested consistencies included:   ??  -Thin liquid by cup  -Puree by teaspoon  -Hard solid  ??  A tracheostomy device is seen. There is mass effect on the anterior esophagus at the C4-C5 vertebral level which causes moderate narrowing. Penetration of the laryngeal vestibule was seen with ingestion of thin liquid. No other laryngeal penetration or tracheal aspiration was seen on today's examination. There was no significant residue following swallows.  ??  IMPRESSION:  ??  1.Penetration of the laryngeal vestibule with ingestion of thin liquid.  2.Mass effect on the anterior esophagus at the C4-C5 vertebral level, which causes moderate nonobstructive narrowing.  ??  Please see full speech and language pathology note for further evaluation.    Scribe's Attestation: Cheryle Horsfall, MD obtained and performed the history, physical exam and medical decision making elements that were entered into the chart. Signed by Esperanza Sheets, Scribe, on March 11, 2018 at 3:34 PM.    ----------------------------------------------------------------------------------------------------------------------  March 12, 2018 2:44 PM. Documentation assistance provided by the Scribe. I was present during the time the encounter was recorded. The information recorded by the Scribe was done at my direction and has been  reviewed and validated by me.  ----------------------------------------------------------------------------------------------------------------------

## 2018-03-16 DIAGNOSIS — C329 Malignant neoplasm of larynx, unspecified: Principal | ICD-10-CM

## 2018-03-16 NOTE — Unmapped (Signed)
P/C requesting refill on Oxycodone , last ordered on 02/18/2018, last visit 08/28/17, future visit 03/19/2018.  No contract or UDS.

## 2018-03-18 ENCOUNTER — Ambulatory Visit: Admit: 2018-03-18 | Discharge: 2018-03-19 | Payer: MEDICARE | Attending: Family | Primary: Family

## 2018-03-18 DIAGNOSIS — C329 Malignant neoplasm of larynx, unspecified: Principal | ICD-10-CM

## 2018-03-18 DIAGNOSIS — L299 Pruritus, unspecified: Principal | ICD-10-CM

## 2018-03-18 DIAGNOSIS — I10 Essential (primary) hypertension: Principal | ICD-10-CM

## 2018-03-18 DIAGNOSIS — B1920 Unspecified viral hepatitis C without hepatic coma: Principal | ICD-10-CM

## 2018-03-18 DIAGNOSIS — K740 Hepatic fibrosis: Principal | ICD-10-CM

## 2018-03-18 DIAGNOSIS — B182 Chronic viral hepatitis C: Principal | ICD-10-CM

## 2018-03-18 LAB — CBC W/ AUTO DIFF
BASOPHILS ABSOLUTE COUNT: 0.1 10*9/L (ref 0.0–0.1)
BASOPHILS RELATIVE PERCENT: 1.4 %
EOSINOPHILS ABSOLUTE COUNT: 0.3 10*9/L (ref 0.0–0.4)
EOSINOPHILS RELATIVE PERCENT: 5.7 %
HEMATOCRIT: 44.9 % (ref 41.0–53.0)
HEMOGLOBIN: 15 g/dL (ref 13.5–17.5)
LARGE UNSTAINED CELLS: 3 % (ref 0–4)
LYMPHOCYTES ABSOLUTE COUNT: 1.2 10*9/L — ABNORMAL LOW (ref 1.5–5.0)
LYMPHOCYTES RELATIVE PERCENT: 23.9 %
MEAN CORPUSCULAR HEMOGLOBIN CONC: 33.3 g/dL (ref 31.0–37.0)
MEAN CORPUSCULAR HEMOGLOBIN: 31.9 pg (ref 26.0–34.0)
MEAN CORPUSCULAR VOLUME: 95.7 fL (ref 80.0–100.0)
MEAN PLATELET VOLUME: 10.2 fL — ABNORMAL HIGH (ref 7.0–10.0)
MONOCYTES ABSOLUTE COUNT: 0.3 10*9/L (ref 0.2–0.8)
MONOCYTES RELATIVE PERCENT: 7 %
PLATELET COUNT: 171 10*9/L (ref 150–440)
RED BLOOD CELL COUNT: 4.69 10*12/L (ref 4.50–5.90)
WBC ADJUSTED: 4.9 10*9/L (ref 4.5–11.0)

## 2018-03-18 LAB — BASIC METABOLIC PANEL
ANION GAP: 12 mmol/L (ref 7–15)
BUN / CREAT RATIO: 11
CHLORIDE: 100 mmol/L (ref 98–107)
CO2: 30 mmol/L (ref 22.0–30.0)
CREATININE: 0.96 mg/dL (ref 0.70–1.30)
EGFR CKD-EPI AA MALE: >90 mL/min/{1.73_m2} (ref >=60)
GLUCOSE RANDOM: 76 mg/dL (ref 70–179)
POTASSIUM: 4 mmol/L (ref 3.5–5.0)
SODIUM: 142 mmol/L (ref 135–145)

## 2018-03-18 LAB — HEPATIC FUNCTION PANEL
ALKALINE PHOSPHATASE: 74 U/L (ref 38–126)
ALT (SGPT): 30 U/L (ref <50)
AST (SGOT): 44 U/L (ref 19–55)
BILIRUBIN DIRECT: 0.2 mg/dL (ref 0.00–0.40)
PROTEIN TOTAL: 7.6 g/dL (ref 6.5–8.3)

## 2018-03-18 MED ORDER — HYDROXYZINE HCL 25 MG TABLET
ORAL_TABLET | Freq: Three times a day (TID) | ORAL | 0 refills | 0 days | Status: CP | PRN
Start: 2018-03-18 — End: 2018-04-14

## 2018-03-18 NOTE — Unmapped (Signed)
1.  Laboratory studies ordered today in preparation of going forward with HCV treatment.   2.  Office follow up three months as tentative office follow up visit. Possible sooner if treatment is approved.   3.  No alcohol use.   4.  First Twinrix vaccination administered today. Second vaccine next visit.   5.  Will discuss with our pharmacist about proceeding forward with treatment approval.   6.  Prescription sent to his local pharmacy for Atarax for itching.   7. Order placed for abdominal ultrasound to be performed end of June to allow [place] to keep up with liver cancer screening.   8.  Any questions please notify our office.

## 2018-03-18 NOTE — Unmapped (Signed)
[facility]    Alba Destine, M.D.  Professor of Medicine  Director, [facility]  [place] [facility] [place] [facility] [place]    [phone]    Referring Provider:  Girtha Hake, MD  [address]  [facility]  [place], [place] [address]     Primary Care Provider:  Burnice Logan, MD    Other Specialist(s): Dr. Cheryle Horsfall, Otolaryngology      Reason For Office Visit:  Reevaluation and management of chronic hepatitis C with advanced fibrosis of liver.      HISTORY OF PRESENT ILLNESS:   Mr. Buerkle is a pleasant 58 y.o. 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 He was initially seen in consultation by Dr. Jackelyn Hoehn and Dr. Sharon Mt on 12/17/2017. From that visit, the decision was made to postpone on proceeding forward with HCV treatment given the fact he was struggle with swallowing at that time. The hope was in the near future trach and G-tube may be removed allowing for [place] to proceed with DAA treatment. He presents alone today. He advises he is not sure when trach is going to be removed. His recent visit with Dr. Gordan Payment on 03/11/2017 the recommendation was for him to transition with trial of passy muir valve. Encouraged to try it 20 minutes at times. He was seen and evaluated by LDP and recommended for him to continue with regular diet and thin liquids. For the past couple of days he has not been using G-tube. Caloric intake has all been orally. He does experience scratchy feeling at time with swallowing but overall has been doing well. He is able to tolerate swallowing pills without any difficulty. He has not done as well with use of passy muir valve. Has follow up visit with Dr. Gordan Payment on 04/22/2018 for reassessment.     He was diagnosed with hepatitis C April 2019 while undergoing work-up prior to his trach and PEG. He notes that he had 2 surgeries at age 23 in his early 76s that required blood transfusions.  He denies IV drug use or tattoos.  He also served in [employer] and was [place]  Labs from September 2019 revealed a hepatitis C viral load greater than 1.4 million and genotype 1a. He denies jaundice, ascites or episodes of confusion. At his last office visit he endorsed pruritus. tbili has been within normal limits. He has undergone trial of benadryl without any longstanding or positive relieve. Labs from August 2019 revealed a hemoglobin of 11.7, platelets 224, AST 39, ALT 24, alk phos 61, T bili 0.4, albumin 3.7 and creatinine 0.67.    REVIEW OF SYSTEMS:   All systems reviewed and negative except as noted above under HPI.     Liver Section:  HCC SURVEILLANCE 12/2017: NO LIVER MASSES  FIBROSCAN 12/2017: 11.7 KPS C/W STAGE F3 ADVANCED FIBROSIS  Chronic hepatitis C, treatment naive genotype 1A  Baseline HCV RNA: Pending    Immunity status hepatitis A and B: #1 Twinrix administered 03/18/2018  HIV status: non reactive    PAST MEDICAL HISTORY:    Past Medical History:   Diagnosis Date   ??? Hepatitis C    ??? Hypertension    ??? Larynx cancer (CMS-HCC)      PAST SURGICAL HISTORY:    Past Surgical History:   Procedure Laterality Date   ??? IR INSERT G-TUBE PERCUTANEOUS  05/29/2017  IR INSERT G-TUBE PERCUTANEOUS 05/29/2017 Gwenlyn Fudge, MD IMG VIR H&V [facility]   ??? PR BRONCHOSCOPY,DIAGNOSTIC  04/05/2017    Procedure: Bronchoscopy, Rigid Or Flexible, W/Wo Fluoroscopic Guidance; Diagnostic, With Cell Washing, When Performed;  Surgeon: Biagio Quint, MD;  Location: MAIN OR [facility];  Service: ENT   ??? PR ESOPHAGOSCOPY,DIAGNOSTIC  04/05/2017    Procedure: Esophagoscopy, Rigid Or Flexible; Diagnostic, W/Wo Collection Of Specimen(S) By Brushing Or Washing;  Surgeon: Biagio Quint, MD;  Location: MAIN OR [facility];  Service: ENT   ??? PR ESOPHAGOSCOPY,DIAGNOSTIC N/A 12/23/2017    Procedure: ESOPHAGOSCOPY, RIGID OR FLEXIBLE; DIAGNOSTIC, W/WO COLLECTION OF SPECIMEN(S) BY BRUSHING OR WASHING; Surgeon: Biagio Quint, MD;  Location: MAIN OR [place];  Service: ENT   ??? PR ESOPHAGOSCOPY,DILATION OVER GUIDE N/A 12/23/2017    Procedure: ESOPHAGOSCOPY, RIGID OR FLEXIBLE; WITH INSERTION OF GUIDE WIRE FOLLOWED BY DILATION OVER GUIDE WIRE;  Surgeon: Biagio Quint, MD;  Location: MAIN OR [facility];  Service: ENT   ??? PR LARYNGOSCOPY,DIRCT,OP,BIOPSY N/A 12/23/2017    Procedure: LARYNGOSCOPY, DIRECT, OPERATIVE, WITH BIOPSY;  Surgeon: Biagio Quint, MD;  Location: MAIN OR [facility];  Service: ENT   ??? PR LARYNGOSCOPY,DIRECT,DIAGNOSTIC N/A 04/05/2017    Procedure: LARYNGOSCOPY DIRECT, WITH OR WITHOUT TRACHEOSCOPY; DIAGNOSTIC, EXCEPT NEWBORN;  Surgeon: Biagio Quint, MD;  Location: MAIN OR [facility];  Service: ENT   ??? PR TRACHEOSTOMY, PLANNED N/A 04/05/2017    Procedure: TRACHEOSTOMY PLANNED (SEPART PROC);  Surgeon: Biagio Quint, MD;  Location: MAIN OR [facility];  Service: ENT   ??? SPINE SURGERY       MEDICATIONS:  Current Outpatient Medications   Medication Sig Dispense Refill   ??? diphenhydrAMINE (BENADRYL) 25 mg capsule Take 1 capsule (25 mg total) by mouth every six (6) hours as needed for itching or allergies. (Patient taking differently: Take 25 mg by mouth every six (6) hours as needed for itching or allergies. Currently using liquid and planning switch to capsule) 20 capsule 0   ??? escitalopram oxalate (LEXAPRO) 20 MG tablet Take 1 tablet (20 mg total) by mouth daily. 90 tablet 1   ??? famotidine (PEPCID) 20 MG tablet Take 1 tablet (20 mg total) by mouth Two (2) times a day. 180 tablet 1   ??? fluticasone propionate (FLONASE) 50 mcg/actuation nasal spray 1 spray by Each Nare route two (2) times a day. 16 g 6   ??? gabapentin (NEURONTIN) 300 MG capsule Take 1 capsule (300 mg total) by mouth nightly. 90 capsule 1   ??? [START ON 03/20/2018] oxyCODONE (ROXICODONE) 5 MG immediate release tablet Take 1 tablet (5 mg total) by mouth every six (6) hours as needed for pain. 120 tablet 0   ??? prochlorperazine (COMPAZINE) 10 MG tablet Take 1 tablet (10 mg total) by mouth every eight (8) hours as needed for nausea. 180 tablet 1   ??? tamsulosin (FLOMAX) 0.4 mg capsule Take 1 capsule (0.4 mg total) by mouth nightly. 90 capsule 1   ??? clonazePAM (KLONOPIN) 0.5 MG tablet TAKE 1/2 TABLET BY G-TUBE ROUTE TWICE DAILY AS NEEDED FOR ANXIETY (Patient not taking: Reported on 03/18/2018) 30 tablet 0   ??? hydrOXYzine (ATARAX) 25 MG tablet Take 1 tablet (25 mg total) by mouth every eight (8) hours as needed for itching. 60 tablet 0   ??? methylPREDNISolone (MEDROL DOSEPACK) 4 mg tablet Take 1 tablet (4 mg total) by mouth daily. follow package directions (Patient not taking: Reported on 03/18/2018) 1 Package 0     No current facility-administered  medications for this visit.    Noted Atarax only added to list today during OV.    ALLERGIES:    Patient has no known allergies.    SOCIAL HISTORY:  Social History     Socioeconomic History   ??? Marital status: Married     Spouse name: Not on file   ??? Number of children: Not on file   ??? Years of education: Not on file   ??? Highest education level: Not on file   Occupational History   ??? Not on file   Social Needs   ??? Financial resource strain: Not on file   ??? Food insecurity     Worry: Not on file     Inability: Not on file   ??? Transportation needs     Medical: Not on file     Non-medical: Not on file   Tobacco Use   ??? Smoking status: Former Smoker     Types: Cigarettes     Last attempt to quit: 03/14/2013     Years since quitting: 5.0   ??? Smokeless tobacco: Never Used   Substance and Sexual Activity   ??? Alcohol use: Not Currently     Comment: quit five to six years ago. I loved my beer. Drank 12 pack daily x years.    ??? Drug use: Yes     Types: Marijuana     Comment: MJ use daily if have.    ??? Sexual activity: Not Currently     Partners: Female   Lifestyle   ??? Physical activity     Days per week: Not on file     Minutes per session: Not on file   ??? Stress: Not on file   Relationships   ??? Social Wellsite geologist on phone: Not on file     Gets together: Not on file     Attends religious service: Not on file     Active member of club or organization: Not on file     Attends meetings of clubs or organizations: Not on file     Relationship status: Not on file   Other Topics Concern   ??? Not on file   Social History Narrative    Quit smoking and drinking 3 years ago. Previously smoked 1.5-2 ppd x 30+ years. Drank a 12 pack of beer per day up until 3 years ago. Now staying with his daughter who is a single working mother in [place]. Wife is 12 years older than him with hearing loss and dementia. She has also moved in with their daughter. He is retired from [employer] job.    His wife and all three children have undergone HCV screening with non reactive results.     FAMILY HISTORY:    Family history is unknown by patient.      VITAL SIGNS:    BP 150/96  - Pulse 66  - Temp 36.5 ??C (97.7 ??F)  - Resp 16  - Ht 165.1 cm (5' 5)  - Wt 74.3 kg (163 lb 14.4 oz)  - SpO2 97%  - BMI 27.27 kg/m??   Body mass index is 27.27 kg/m??.    PHYSICAL EXAM:   General appearance - alert, well appearing, and in no distress, oriented to person, place, and time and normal appearing weight  Mental status - normal mood, behavior, speech, dress, motor activity, and thought processes  Eyes - pupils equal and reactive, extraocular eye movements intact, sclera anicteric  Neck - supple, trach in place.   Chest - clear to auscultation, no wheezes, rales or rhonchi, symmetric air entry  Heart - normal rate, regular rhythm, normal S1, S2, no murmurs, rubs, clicks or gallops  Abdomen - soft, nontender, nondistended, no masses or organomegaly  Neurological - screening mental status exam normal  Musculoskeletal - no joint tenderness, deformity or swelling  Extremities - peripheral pulses normal, no pedal edema, no clubbing or cyanosis  Skin - normal coloration and turgor, no rashes, no suspicious skin lesions noted    Laboratory results:  Results for orders placed or performed in visit on 03/18/18   AFP non-maternal tumor marker   Result Value Ref Range    AFP-Tumor Marker 23 (H) <8 ng/mL   PT-INR   Result Value Ref Range    PT 11.0 10.2 - 13.1 sec    INR 0.96    Hepatic Function Panel   Result Value Ref Range    Albumin 4.0 3.5 - 5.0 g/dL    Total Protein 7.6 6.5 - 8.3 g/dL    Total Bilirubin 0.6 0.0 - 1.2 mg/dL    Bilirubin, Direct 8.41 0.00 - 0.40 mg/dL    AST 44 19 - 55 U/L    ALT 30 <50 U/L    Alkaline Phosphatase 74 38 - 126 U/L   Basic metabolic panel   Result Value Ref Range    Sodium 142 135 - 145 mmol/L    Potassium 4.0 3.5 - 5.0 mmol/L    Chloride 100 98 - 107 mmol/L    CO2 30.0 22.0 - 30.0 mmol/L    Anion Gap 12 7 - 15 mmol/L    BUN 11 7 - 21 mg/dL    Creatinine 3.24 4.01 - 1.30 mg/dL    BUN/Creatinine Ratio 11     EGFR CKD-EPI Non-African American, Male 93 >=60 mL/min/1.53m2    EGFR CKD-EPI African American, Male >90 >=60 mL/min/1.72m2    Glucose 76 70 - 179 mg/dL    Calcium 9.9 8.5 - 02.7 mg/dL   CBC w/ Differential   Result Value Ref Range    WBC 4.9 4.5 - 11.0 10*9/L    RBC 4.69 4.50 - 5.90 10*12/L    HGB 15.0 13.5 - 17.5 g/dL    HCT 25.3 66.4 - 40.3 %    MCV 95.7 80.0 - 100.0 fL    MCH 31.9 26.0 - 34.0 pg    MCHC 33.3 31.0 - 37.0 g/dL    RDW 47.4 25.9 - 56.3 %    MPV 10.2 (H) 7.0 - 10.0 fL    Platelet 171 150 - 440 10*9/L    Neutrophils % 58.9 %    Lymphocytes % 23.9 %    Monocytes % 7.0 %    Eosinophils % 5.7 %    Basophils % 1.4 %    Absolute Neutrophils 2.9 2.0 - 7.5 10*9/L    Absolute Lymphocytes 1.2 (L) 1.5 - 5.0 10*9/L    Absolute Monocytes 0.3 0.2 - 0.8 10*9/L    Absolute Eosinophils 0.3 0.0 - 0.4 10*9/L    Absolute Basophils 0.1 0.0 - 0.1 10*9/L    Large Unstained Cells 3 0 - 4 %    Macrocytosis Slight (A) Not Present      ASSESSMENT/PLAN:      1. Chronic hepatitis C, treatment naive with genotype 1A. + advanced fibrosis of liver. Fibroscan consistent with F3.   Mr. Southwell is a 58 y.o. year old Caucasian male with PMHx  of laryngeal cancer s/p local radiation and tracheotomy and PEG who presents today for revaluation and management of HCV. At the time of his OV on 12/17/2017, the decision was made to post pone on proceeding forward with HCV treatment given the fact he was experiencing difficulty swallowing still and continued use of G-tube. He is doing much better now. He has been able to tolerate solid foods, liquids and pills all by mouth. He has not had to use G-tube for the past few days. Unknown length of time though that trach will remain in place.     He likely contracted HCV from blood transfusions as he has no other known risk factors. We readdressed the natural history of HCV, risk factors for more aggressive fibrosis, transmission, and treatment. We discussed possible the oral therapies to treat HCV have a high cure rate and favorable side effect profile. At this time we will proceed forward with PA process for Harvoni x 8 weeks. Will notify our pharmacist, Park Breed of patient's improvement in his health and feel he is an ideal candidate at this time for txment.     ~ HCV safety and MELD labs ordered.   ~ Office follow up three months as tentative office visit, possible sooner if txment is approved per txment protocol.   ~ No alcohol use.     2. Transmission counseling: All family members have been screened as noted above and non reactive results.     3. HCC screening: order placed for abdominal ultrasound the end of June for continued surveillance. Warrants surveillance every six months based on advanced fibrosis.     4.  Immunity status hepatitis A and B: #1 Twinrix vaccine administered today. Warrant #2 next visit.      5. HIV status: non reactive.     6. Chronic pruritis: Proceed with trial of Atarax 25 mg tablet with directions of one tablet every 8 hours as needed. RX sent to his local pharmacy for #60 with no refills. Instructed to stop benadryl while taking Atarax.     All patient's questions were answered to his satisfaction during office visit today.     Rodman Key, DNP, FNP-BC  [facility]  [id number] [address]  [place] [address]  Phone [phone]

## 2018-03-19 ENCOUNTER — Ambulatory Visit: Admit: 2018-03-19 | Discharge: 2018-03-20 | Payer: MEDICARE

## 2018-03-19 DIAGNOSIS — B182 Chronic viral hepatitis C: Principal | ICD-10-CM

## 2018-03-19 DIAGNOSIS — B1920 Unspecified viral hepatitis C without hepatic coma: Principal | ICD-10-CM

## 2018-03-19 DIAGNOSIS — C329 Malignant neoplasm of larynx, unspecified: Principal | ICD-10-CM

## 2018-03-19 DIAGNOSIS — I10 Essential (primary) hypertension: Principal | ICD-10-CM

## 2018-03-19 LAB — PROTIME-INR: INR: 0.96 sec (ref 10.2–13.1)

## 2018-03-19 LAB — LIPID PANEL
CHOLESTEROL: 186 mg/dL (ref 100–199)
HDL CHOLESTEROL: 55 mg/dL (ref 40–59)
NON-HDL CHOLESTEROL: 131 mg/dL
TRIGLYCERIDES: 136 mg/dL (ref 1–149)
VLDL CHOLESTEROL CAL: 27.2 mg/dL (ref 12–47)

## 2018-03-19 LAB — HEPATIC FUNCTION PANEL: ALKALINE PHOSPHATASE: 74 U/L (ref 38–126)

## 2018-03-19 LAB — CBC W/ AUTO DIFF: WBC ADJUSTED: 4.9 10*9/L — ABNORMAL HIGH (ref 4.5–11.0)

## 2018-03-19 LAB — AFP TUMOR MARKER: AFP-TUMOR MARKER: 23 ng/mL — ABNORMAL HIGH (ref <8)

## 2018-03-19 MED ORDER — LISINOPRIL 10 MG TABLET
ORAL_TABLET | Freq: Every day | ORAL | 11 refills | 0 days | Status: CP
Start: 2018-03-19 — End: 2019-03-19

## 2018-03-20 MED ORDER — OXYCODONE 5 MG TABLET
ORAL_TABLET | Freq: Four times a day (QID) | ORAL | 0 refills | 0.00000 days | Status: CP | PRN
Start: 2018-03-20 — End: 2018-04-17

## 2018-03-20 NOTE — Unmapped (Signed)
Name:  Javier Clark  DOB: 22-Jan-1960  Today's Date: 03/19/2018  Age:  58 y.o.    Assessment and Plan:     Hakan was seen today for larynx cancer.    Diagnoses and all orders for this visit:    Essential hypertension  -     lisinopril (PRINIVIL,ZESTRIL) 10 MG tablet; Take 1 tablet (10 mg total) by mouth daily.  -     Lipid Panel    Larynx cancer (CMS-HCC)    Chronic hepatitis C without hepatic coma (CMS-HCC)             HPI:      Javier Clark  is here for   Chief Complaint   Patient presents with   ??? Larynx cancer     f/u       He is here for follow up and to sign controlled substances contract.   He has been treated for laryngeal cancer, and has a tracheostomy in place. He is able to speak with trach covered.  He is soon to be treated for his chronic hepatitis C with hepatic fibrosis.  His blood pressure medication was discontinued in the past year, and his blood pressure has climbed gradually.     ROS:      Review of Systems    REVIEW OF SYSTEMS:    Constitutional: No significant weight loss. No significant weight gain. No fever, chills.  HEAD/FACE: No complaints.  EYES: No complaints.  ENTM: No complaints regarding ears. No complaints regarding nose. . No complaints regarding mouth. See HPI.  Respiratory: No shortness of breath. No prolonged cough.  Cardiovascular: No exertional chest pain. No palpitations. No edema.  Gastrointestinal: No abdominal pain. No constipation. No diarrhea. No bright red blood in bowel movements.   Skin: No complaints.    Genitourinary: No urinary frequency. No dysuria. No flank pain.  Neurological: No severe or recurrent headache. No focal neurological symptoms.  Hematologic/Lymphatic/Immunologic: No complaints.         Vital Signs:     Wt Readings from Last 3 Encounters:   03/19/18 74.8 kg (165 lb)   03/18/18 74.3 kg (163 lb 14.4 oz)   03/11/18 73.9 kg (163 lb)     Temp Readings from Last 3 Encounters:   03/19/18 36.5 ??C (97.7 ??F) (Oral)   03/18/18 36.5 ??C (97.7 ??F)   03/11/18 37.1 ??C (98.7 ??F)     BP Readings from Last 3 Encounters:   03/19/18 140/84   03/18/18 150/96   03/11/18 156/96     Pulse Readings from Last 3 Encounters:   03/19/18 70   03/18/18 66   03/11/18 77     Estimated body mass index is 27.46 kg/m?? as calculated from the following:    Height as of this encounter: 165.1 cm (5' 5).    Weight as of this encounter: 74.8 kg (165 lb).  Facility age limit for growth percentiles is 20 years.        Objective:      Physical Exam    PHYSICAL EXAM:     CONSTITUTIONAL: Alert, oriented, in no apparent distress.  ENT: Ears, nose, throat clear.    EYES: No gross abnormalities.  NECK: Tracheostomy in place. Wound looks well-healed. No masses. No thyromegaly. No lymphadenopathy.   LYMPHATIC:  No lymphadenopathy.  CARDIOVASCULAR: Heart: Regular rhythm. No S3.   LUNGS: Clear to auscultation.  GASTROINTESTINAL: Abdomen soft. Non-distended. Non-tender. No masses or organomegaly. Bowel sounds normal. No bruit heard.  MUSCULOSKELETAL: No clubbing. No edema.  SKIN:  No rash.   NEURO: Alert, oriented. Speech normal.   PSYCH: Affect normal. Normal eye contact.  HEME/LYMPH/IMMUNE:  No abnormalities noted.        25 minutes were spent with patient. At least 50% of time was spent in counseling and coordination of care.  Controlled substance contract reviewed and signed.    Medication adherence and barriers to the treatment plan have been addressed. Opportunities to optimize healthy behaviors have been discussed. Patient / caregiver voiced understanding.         Past Medical/Surgical History:     Past Medical History:   Diagnosis Date   ??? Hepatitis C    ??? Hypertension    ??? Larynx cancer (CMS-HCC)      Past Surgical History:   Procedure Laterality Date   ??? IR INSERT G-TUBE PERCUTANEOUS  05/29/2017    IR INSERT G-TUBE PERCUTANEOUS 05/29/2017 Gwenlyn Fudge, MD IMG VIR H&V [facility]   ??? PR BRONCHOSCOPY,DIAGNOSTIC  04/05/2017    Procedure: Bronchoscopy, Rigid Or Flexible, W/Wo Fluoroscopic Guidance; Diagnostic, With Cell Washing, When Performed;  Surgeon: Biagio Quint, MD;  Location: MAIN OR [facility];  Service: ENT   ??? PR ESOPHAGOSCOPY,DIAGNOSTIC  04/05/2017    Procedure: Esophagoscopy, Rigid Or Flexible; Diagnostic, W/Wo Collection Of Specimen(S) By Brushing Or Washing;  Surgeon: Biagio Quint, MD;  Location: MAIN OR [facility];  Service: ENT   ??? PR ESOPHAGOSCOPY,DIAGNOSTIC N/A 12/23/2017    Procedure: ESOPHAGOSCOPY, RIGID OR FLEXIBLE; DIAGNOSTIC, W/WO COLLECTION OF SPECIMEN(S) BY BRUSHING OR WASHING;  Surgeon: Biagio Quint, MD;  Location: MAIN OR [place];  Service: ENT   ??? PR ESOPHAGOSCOPY,DILATION OVER GUIDE N/A 12/23/2017    Procedure: ESOPHAGOSCOPY, RIGID OR FLEXIBLE; WITH INSERTION OF GUIDE WIRE FOLLOWED BY DILATION OVER GUIDE WIRE;  Surgeon: Biagio Quint, MD;  Location: MAIN OR [facility];  Service: ENT   ??? PR LARYNGOSCOPY,DIRCT,OP,BIOPSY N/A 12/23/2017    Procedure: LARYNGOSCOPY, DIRECT, OPERATIVE, WITH BIOPSY;  Surgeon: Biagio Quint, MD;  Location: MAIN OR [facility];  Service: ENT   ??? PR LARYNGOSCOPY,DIRECT,DIAGNOSTIC N/A 04/05/2017    Procedure: LARYNGOSCOPY DIRECT, WITH OR WITHOUT TRACHEOSCOPY; DIAGNOSTIC, EXCEPT NEWBORN;  Surgeon: Biagio Quint, MD;  Location: MAIN OR [facility];  Service: ENT   ??? PR TRACHEOSTOMY, PLANNED N/A 04/05/2017    Procedure: TRACHEOSTOMY PLANNED (SEPART PROC);  Surgeon: Biagio Quint, MD;  Location: MAIN OR [facility];  Service: ENT   ??? SPINE SURGERY           Family History:     Family History   Family history unknown: Yes       Social History:     Social History     Socioeconomic History   ??? Marital status: Married     Spouse name: None   ??? Number of children: None   ??? Years of education: None   ??? Highest education level: None   Occupational History   ??? None   Social Needs   ??? Financial resource strain: None   ??? Food insecurity     Worry: None     Inability: None   ??? Transportation needs     Medical: None     Non-medical: None   Tobacco Use   ??? Smoking status: Former Smoker     Types: Cigarettes     Last attempt to quit: 03/14/2013     Years since quitting: 5.0   ??? Smokeless tobacco: Never Used   Substance and Sexual  Activity   ??? Alcohol use: Not Currently     Comment: quit five to six years ago. I loved my beer. Drank 12 pack daily x years.    ??? Drug use: Yes     Types: Marijuana     Comment: MJ use daily if have.    ??? Sexual activity: Not Currently     Partners: Female   Lifestyle   ??? Physical activity     Days per week: None     Minutes per session: None   ??? Stress: None   Relationships   ??? Social Wellsite geologist on phone: None     Gets together: None     Attends religious service: None     Active member of club or organization: None     Attends meetings of clubs or organizations: None     Relationship status: None   Other Topics Concern   ??? None   Social History Narrative    Quit smoking and drinking 3 years ago. Previously smoked 1.5-2 ppd x 30+ years. Drank a 12 pack of beer per day up until 3 years ago. Now staying with his daughter who is a single working mother in [place]. Wife is 12 years older than him with hearing loss and dementia. She has also moved in with their daughter. He is retired from [employer] job.          Allergies:     Patient has no known allergies.    Current Medications:     Current Outpatient Medications   Medication Sig Dispense Refill   ??? escitalopram oxalate (LEXAPRO) 20 MG tablet Take 1 tablet (20 mg total) by mouth daily. 90 tablet 1   ??? famotidine (PEPCID) 20 MG tablet Take 1 tablet (20 mg total) by mouth Two (2) times a day. 180 tablet 1   ??? fluticasone propionate (FLONASE) 50 mcg/actuation nasal spray 1 spray by Each Nare route two (2) times a day. 16 g 6   ??? gabapentin (NEURONTIN) 300 MG capsule Take 1 capsule (300 mg total) by mouth nightly. 90 capsule 1   ??? hydrOXYzine (ATARAX) 25 MG tablet Take 1 tablet (25 mg total) by mouth every eight (8) hours as needed for itching. 60 tablet 0   ??? methylPREDNISolone (MEDROL DOSEPACK) 4 mg tablet Take 1 tablet (4 mg total) by mouth daily. follow package directions 1 Package 0   ??? [START ON 03/20/2018] oxyCODONE (ROXICODONE) 5 MG immediate release tablet Take 1 tablet (5 mg total) by mouth every six (6) hours as needed for pain. 120 tablet 0   ??? prochlorperazine (COMPAZINE) 10 MG tablet Take 1 tablet (10 mg total) by mouth every eight (8) hours as needed for nausea. 180 tablet 1   ??? tamsulosin (FLOMAX) 0.4 mg capsule Take 1 capsule (0.4 mg total) by mouth nightly. 90 capsule 1   ??? lisinopril (PRINIVIL,ZESTRIL) 10 MG tablet Take 1 tablet (10 mg total) by mouth daily. 30 tablet 11     No current facility-administered medications for this visit.          POCT:     Results for orders placed or performed in visit on 03/19/18   Lipid Panel   Result Value Ref Range    Triglycerides 136 1 - 149 mg/dL    Cholesterol 725 366 - 199 mg/dL    HDL 55 40 - 59 mg/dL    LDL Calculated 440 (H) 60 - 99 mg/dL  VLDL Cholesterol Cal 27.2 12 - 47 mg/dL    Chol/HDL Ratio 3.4 <1.6    Non-HDL Cholesterol 131 mg/dL    FASTING Unknown          Lavra Imler Dorthula Matas, MD

## 2018-03-23 LAB — HEPATITIS C RNA, QUANTITATIVE, PCR: HCV RNA LOG10: 6.87 log IU/mL — ABNORMAL HIGH (ref <0.00)

## 2018-03-24 DIAGNOSIS — B182 Chronic viral hepatitis C: Principal | ICD-10-CM

## 2018-03-24 MED ORDER — LEDIPASVIR 90 MG-SOFOSBUVIR 400 MG TABLET
ORAL_TABLET | Freq: Every day | ORAL | 2 refills | 0 days | Status: CP
Start: 2018-03-24 — End: ?
  Filled 2018-03-31: qty 28, 28d supply, fill #0

## 2018-03-24 NOTE — Unmapped (Signed)
Specialty Medication Referral    Medication: Harvoni  Duration: 12 weeks  Reason for 12 weeks: HCV RNA >6 million IU/ml  Prescriber: [name], DNP  Supervising MD: Dr. Foy [place]  Diagnosis:   B18.2 Hep C: yes    K74.60 Cirrhosis: no,   Signs of liver decompensation: no    Child Pugh Score if applicable and for Medicaid pts: n/a  Z94.4 Liver Transplant: no  Genotype: 1a (09/22/17)  HCV RNA: 1,610,960 IU/ml on 03/18/18  Fibrosis score: F3 (11.7 kPa) on Fibroscan on 12/17/17  HIV Co-infection: no  Previous treatment: no

## 2018-03-27 NOTE — Unmapped (Signed)
[facility] Specialty Medication Referral: PA and Financial Assistance Approved      Medication (Brand/Generic): Harvoni    Final Test Claim completed with resulted information below:    Patient ABLE to fill at [facility]  Insurance Company:  [employer] [employer]  Anticipated Copay: $0  Is anticipated copay with a copay card or grant? Yes, there was a grant approved this patient    Does patient's insurance plan only allow a 15 day supply for the first 6 fills in the [facility]? No  If yes, inform patient they can request to dis-enroll from the [facility] by calling the patient help desk at N/A.      If the copay is under the $25 defined limit, per policy there will be no further investigation of need for financial assistance at this time unless patient requests. This referral has been communicated to the provider and handed off to the [facility] team for further processing and filling of prescribed medication.   ______________________________________________________________________  Please utilize this referral for viewing purposes as it will serve as the central location for all relevant documentation and updates.

## 2018-03-30 NOTE — Unmapped (Signed)
Initial Counseling for HCV Treatment     Planned regimen: Harvoni (ledipasvir/sofosbuvir 90/400mg ) x 12 weeks  Planned start date: 04/02/18    Pharmacy: [facility] [phone] option #4  The [facility] pharmacy is open Monday through Friday 8:30am-4:30pm.  A pharmacist is available 24/7 via pager to answer any clinical questions they may have.    PMH:   Past Medical History:   Diagnosis Date   ??? Hepatitis C    ??? Hypertension    ??? Larynx cancer (CMS-HCC)      Current meds: Lexapro, famotidine 20mg  daily, gabapentin 300mg  qhs, tamsulosin, hydroxyzine, lisinopril 10mg , prochlorperazine 10mg  prn, oxycodone 5mg  prn, Flonase prn.    HIPAA was verified with patient. Verified therapy is appropriate for patient. Patient is ready to start Harvoni.     Following topics were discussed during counseling:   Patient Counseling    Counseled the patient on the following:  doses and administration discussed, safe handling, storage, and disposal discussed, possible adverse effects and management discussed, possible drug and prescription drug interactions discussed, possible drug and OTC drug and food interactions discussed, lab monitoring and follow-up discussed, cost of medications and cost implications discussed, adherence and missed doses discussed, pharmacy contact information discussed        1. Indications for medication, dosage and administration.     A. Harvoni 90/400mg  1 tablet to take daily with or without food. Patient plans to take in the mornings.      2. Common side effects of medications and management strategies. (fatigue, headache)      3. Importance of adherence to regimen, follow-up clinic visits and lab monitoring.   Medication Adherence    Demonstrates understanding of importance of adherence:  yes  Informant:  patient  Patient is at risk for Non-Adherence:  No  Reasons for non-adherence:  no problems identified  Support network for adherence:  family member   Other support network: Daughter, [name]   Confirmed plan for next specialty medication refill:  delivery by pharmacy       A. Asked patient to call Carren Rang at [phone] establish start date for treatment and to schedule appointment 4 weeks before starting treatment.       4. Drug-drug interaction.  Drug Interactions    Drug interactions evaluated:  yes  Clinically relevant drug interactions identified:  yes   Interactions list:  Famotidine 20mg  qday - may decrease efficacy of Harvoni   Drug management plan:   We discussed the mechanism of drug-drug interaction with acid lowering agents such as famotidine may decrease the absorption of Harvoni.   If must take famotidine, instructed pt to take to famotidine at same time.          A. Current medications have been reviewed and assessed for possible interaction.  We discussed the mechanism of drug-drug interaction with acid lowering agents. Advised to check with MD or pharmacist before taking any OTC/herbal medications, with emphasis regarding indigestion/heartburn medications.  Denies use of herbal medication such as milk thistle or St. John's wart.  Allergies have been verified. Denies alcohol.     5. Importance of informing pharmacy and clinic of updated contact information. Stressed importance of being able to reach over the phone to set up refills. Advised patient to call pharmacy when down to about 7 day supply left to ensure there's no interruption in therapy.  Refill Coordination    Has the Patients' Contact Information Changed:  No  Is the Shipping Address Different:  No      sent message to [facility] scheduling pool.  Shipping Information    Delivery Scheduled:  Yes  Delivery Date:  04/01/18  Medications to be Shipped:  Harvoni         Patient specific needs were assessed and addressed: language differences, literacy level, cultural barriers, cognitive and/or physical impairments. Informed patient that welcome packet will be sent.    Patient verbalized understanding of counseling. Provided contact information for any further questions/concerns.     Park Breed, Pharm D., BCPS, BCGP, CPP  [facility]  [address]  [place], [place] [address]  [phone]    March 30, 2018 11:11 AM

## 2018-03-30 NOTE — Unmapped (Signed)
Laryngoscope serial number [id number] was used during this visit on 03/11/2018

## 2018-03-31 MED FILL — LEDIPASVIR 90 MG-SOFOSBUVIR 400 MG TABLET: 28 days supply | Qty: 28 | Fill #0 | Status: AC

## 2018-04-14 DIAGNOSIS — L299 Pruritus, unspecified: Principal | ICD-10-CM

## 2018-04-14 MED ORDER — HYDROXYZINE HCL 25 MG TABLET
ORAL_TABLET | Freq: Three times a day (TID) | ORAL | 1 refills | 0 days | Status: CP | PRN
Start: 2018-04-14 — End: 2018-05-26

## 2018-04-14 NOTE — Unmapped (Signed)
[facility] Specialty Pharmacy Refill Coordination Note    Specialty Medication(s) to be Shipped:   Infectious Disease: Harvoni    Other medication(s) to be shipped: N/A     Javier Clark, DOB: [birth year]  Phone: [phone] (home)       All above HIPAA information was verified with patient.     Completed refill call assessment today to schedule patient's medication shipment from the [facility] [phone]).       Specialty medication(s) and dose(s) confirmed: Regimen is correct and unchanged.   Changes to medications: Jentry reports no changes reported at this time.  Changes to insurance: No  Questions for the pharmacist: Yes: TRANSFERRED PT TO CPP [name]. PATIENT WANTED TO KNOW IF HARVONI WAS GOING TO HELP OR STOP HIS ITCHING.     Confirmed patient received Conservation officer, historic buildings with first shipment. The patient will receive a drug information handout for each medication shipped and additional FDA Medication Guides as required.       DISEASE/MEDICATION-SPECIFIC INFORMATION        For Hepatitis C patients:  What time of day do you take your medicine? Morning    PATIENT REPORTS STARTING NO NEW MEDICATIONS, INCLUDING NO NEW OTC ITEMS.    PATIENT WAS UNABLE TO CONFIRM ACTUAL START DATE. PATIENT WOULD HAVE RECEIVED HARVONI NO SOONER THAN 3/18, AND PLANNED START WAS 3/19.  SPECIALTY MEDICATION ADHERENCE     Medication Adherence    Patient reported X missed doses in the last month:  0  Specialty Medication:  HARVONI  Patient is on additional specialty medications:  No  Support network for adherence:  family member   Other support network:  Daughter, Government social research officer                 PATIENT WAS ONLY ABLE TO CONFIRM HE HAS #9 HARVONI LEFT      SHIPPING     Shipping address confirmed in Epic.     Delivery Scheduled: Yes, Expected medication delivery date: 4/10.     Medication will be delivered via UPS to the home address in Epic WAM.    [name]   [facility] Specialty Technician

## 2018-04-14 NOTE — Unmapped (Signed)
Follow-Up Counseling for HCV Treatment    Regimen: Harvoni (ledipasvir/sofosbuvir 90/400mg ) x 12 weeks  Start Date: 04/02/18  Completed Treatment Week #1    Pharmacy: [facility] [phone] option #4    HIPAA information was verified with patient. Patient reports hydroxyzine prescribed by [name], DNP on 03/18/18 has been helping with his itching. He is using about 2 tabs/day and denies any adverse effects from it. Requesting refill. Advised I will send message to [name], DNP that he is requesting refill. Message sent via inBasket.       Following topics were reviewed during the phone call:    1. Medication appropriateness and administration - Takes Harvoni daily in the mornings along with his famotidine.  Regimen is correct and unchanged.     2. Importance of adherence - Pill count over the phone revealed #15 tablets which is appropriate. Pt is using pill box to help with adherence.    3. Side effects - No new side effects to report.    4. Drug-drug interaction -   Changes to medications: Javier Clark reports no changes reported at this time.     5. Follow up - Has follow up appointment scheduled in HCV treatment clinic on 06/17/18.  No TW#4 follow up due to Covid-19.    All questions were answered.    Park Breed, Pharm D., BCPS, BCGP, CPP  [facility]  [address]  [place], [place] [address]  [phone]

## 2018-04-15 NOTE — Unmapped (Signed)
Open in error

## 2018-04-17 MED ORDER — ESCITALOPRAM 20 MG TABLET
ORAL_TABLET | Freq: Every day | ORAL | 1 refills | 0.00000 days | Status: CP
Start: 2018-04-17 — End: 2019-04-17

## 2018-04-17 NOTE — Unmapped (Signed)
P/C requesting refill on Lexapro and Oxycodone , last ordered on 01/15/2018 and , last visit 03/20/2018.  No contract or UDS.

## 2018-04-19 MED ORDER — OXYCODONE 5 MG TABLET
ORAL_TABLET | Freq: Four times a day (QID) | ORAL | 0 refills | 0.00000 days | Status: CP | PRN
Start: 2018-04-19 — End: 2018-05-20

## 2018-04-19 NOTE — Unmapped (Signed)
I was the supervising physician in the delivery of the service. Alba Destine, MD

## 2018-04-23 MED FILL — LEDIPASVIR 90 MG-SOFOSBUVIR 400 MG TABLET: ORAL | 28 days supply | Qty: 28 | Fill #1

## 2018-04-23 MED FILL — LEDIPASVIR 90 MG-SOFOSBUVIR 400 MG TABLET: 28 days supply | Qty: 28 | Fill #1 | Status: AC

## 2018-05-11 NOTE — Unmapped (Signed)
[facility] Pharm[facility] Refill Coordination Note    Specialty Medication(s) to be Shipped:   Infectious Disease: Harvoni    Other medication(s) to be shipped: na     Javier Clark, DOB: 23-Apr-1960  Phone: [phone] (home)       All above HIPAA information was verified with patient's family member.     Completed refill call assessment today to schedule patient's medication shipment from the [facility] [phone]).       Specialty medication(s) and dose(s) confirmed: Regimen is correct and unchanged.   Changes to medications: Alann reports no changes at this time.  Changes to insurance: No  Questions for the pharmacist: No    Confirmed patient received Welcome Packet with first shipment. The patient will receive a drug information handout for each medication shipped and additional FDA Medication Guides as required.       DISEASE/MEDICATION-SPECIFIC INFORMATION        N/A    SPECIALTY MEDICATION ADHERENCE     Medication Adherence    Patient reported X missed doses in the last month:  0  Specialty Medication:  HARVONI  Patient is on additional specialty medications:  No  Patient is on more than two specialty medications:  No  Any gaps in refill history greater than 2 weeks in the last 3 months:  no  Demonstrates understanding of importance of adherence:  yes  Informant:  child/children  Reliability of informant:  reliable  Support network for adherence:  family member   Other support network:  Daughter, [name]   Confirmed plan for next specialty medication refill:  delivery by pharmacy  Refills needed for supportive medications:  not needed          Refill Coordination    Has the Patients' Contact Information Changed:  No  Is the Shipping Address Different:  No           Harvoni. Patient has 16 tablets remaining      SHIPPING     Shipping address confirmed in Epic.     Delivery Scheduled: Yes, Expected medication delivery date: 050820.     Medication will be delivered via Next Day Courier to the home address in Epic WAM.    Tamella Tuccillo D Noora Locascio   [facility] Specialty Technician

## 2018-05-20 MED ORDER — OXYCODONE 5 MG TABLET
ORAL_TABLET | Freq: Four times a day (QID) | ORAL | 0 refills | 0.00000 days | Status: CP | PRN
Start: 2018-05-20 — End: 2018-06-18

## 2018-05-20 NOTE — Unmapped (Signed)
P/C requesting refill on Oxycodone , last ordered on 04/17/2018, last visit 03/19/2018. No contract or UDS.

## 2018-05-21 MED FILL — LEDIPASVIR 90 MG-SOFOSBUVIR 400 MG TABLET: ORAL | 28 days supply | Qty: 28 | Fill #2

## 2018-05-21 MED FILL — LEDIPASVIR 90 MG-SOFOSBUVIR 400 MG TABLET: 28 days supply | Qty: 28 | Fill #2 | Status: AC

## 2018-05-26 MED ORDER — HYDROXYZINE HCL 25 MG TABLET
ORAL_TABLET | Freq: Three times a day (TID) | ORAL | 1 refills | 0 days | Status: CP | PRN
Start: 2018-05-26 — End: 2018-08-05

## 2018-06-05 NOTE — Unmapped (Signed)
Hi,     Patient contacted the Communication Center requesting to speak with the care team of Javier Clark to discuss:    Patient says he is returning a call from Dr. Gordan Payment and wants to speak to the care team.    Please contact patient at [phone].    Check Indicates criteria has been reviewed and confirmed with the patient:    [x]  Preferred Name   [x]  DOB and/or MR#  [x]  Preferred Contact Method  [x]  Phone Number(s)   []  MyChart     Thank you,   Drema Balzarine  [facility]   [phone]

## 2018-06-10 NOTE — Unmapped (Signed)
Call went straight to voicemail. Lvmtrc if any questions or concerns.

## 2018-06-15 NOTE — Unmapped (Addendum)
Follow-Up Counseling for HCV Treatment    Regimen: Harvoni (ledipasvir/sofosbuvir 90/400mg ) x 12 weeks  Start Date: 04/02/18  Completed Treatment Week #11    Pharmacy: [facility] [phone]     HIPAA information was verified with patient. Following topics were reviewed during the phone call:  Patient Counseling    Counseled the patient on the following:  doses and administration discussed, possible adverse effects and management discussed, possible drug and prescription drug interactions discussed, possible drug and OTC drug and food interactions discussed, lab monitoring and follow-up discussed, therapeutic rationale discussed, adherence and missed doses discussed, pharmacy contact information discussed     1. Medication appropriateness and administration - Takes Harvoni every morning at 8:00 am.  Regimen is correct and unchanged.     2. Importance of adherence -   Medication Adherence    Support network for adherence:  family member   Other support network:  Daughter, Javier Clark       Pill count over the phone revealed #7 tablets which is appropriate.      3. Side effects - No new side effects to report. Pt stated he continues to have a little itching and needs a new prescription of hydrOXYzine (ATARAX) 25 MG tablet called into [facility]. Also discussed the importance of making sure he drinks plenty of fluid to stay hydrated during the hot summer, and he can use a good moisturizer lotion etc.CeraVe or Eucerin to prevent any skin breakdown from itching.   Adverse Effects    Pruritus:  Pos     4. Drug-drug interaction -   Changes to medications: Javier Clark reports no changes reported at this time. Pt continues to take famotidine in the morning at 0800 am with Harvoni.    Patient denies any alcohol use.  Drug Interactions    Drug interactions evaluated:  yes  Clinically relevant drug interactions identified:  yes   Interactions list:  Famotidine 20mg  qday - may decrease efficacy of Harvoni Drug management plan:   We discussed the mechanism of drug-drug interaction with acid lowering agents such as famotidine may decrease the absorption of Harvoni.   If must take famotidine, instructed pt to take to famotidine at same time.        5. Follow up - Has follow up appointment  Via tele health scheduled on 06/17/18 @ 1:00 PM with [name], DNP. Any barriers to tele health appointment? No.  Informed patient that there is still a small chance of relapse after finishing the treatment. Stressed importance of follow up 3 months post treatment to assess for cure.    All questions were answered.    Vertell Limber RN, [name]   Pharmacy Adult GI Medicine  [facility]  [address]   [place], [place] [address]  [phone]    June 17, 2018 11:48 AM

## 2018-06-17 DIAGNOSIS — B182 Chronic viral hepatitis C: Secondary | ICD-10-CM

## 2018-06-17 DIAGNOSIS — K740 Hepatic fibrosis: Principal | ICD-10-CM

## 2018-06-17 NOTE — Unmapped (Signed)
[facility]    Alba Destine, M.D.  Professor of Medicine  Director, [facility]  [place] [facility] [place] [facility] [place]    [phone]    Referring Provider:  Girtha Hake, MD  [address]  [facility]  [place], [place] [address]     Primary Care Provider:  Burnice Logan, MD    Other Specialist(s): Dr. Cheryle Horsfall, Otolaryngology      Reason For Office Visit: Treatment follow up HCV, + advanced fibrosis of liver  Harvoni x 12 weeks  Start Date: April 02, 2018  TW #8 D#1 (seven tablets left to complete course).         HISTORY OF PRESENT ILLNESS:  Mr. Javier Clark is a pleasant 58 y.o. 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 He was initially seen in consultation by Dr. Jackelyn Hoehn and Dr. Sharon Mt on 12/17/2017. From that visit, the decision was made to postpone proceeding forward with HCV treatment due the fact he was struggle with swallowing. The hope was in the near future trach and G-tube may be removed allowing for [place] to proceed with DAA treatment. At his last clinic visit, he advised he was not sure when trach was going to be removed. His visit with Dr. Gordan Payment on 03/11/2017 the recommendation was for him to transition with trial of passy muir valve. Encouraged to try it 20 minutes at times. He was seen and evaluated by LDP and recommended for him to continue with regular diet and thin liquids. For the past couple of days. He has been able to avoid use of G-tube for past few months now and continues to do very well with increasing his caloric intake. He does experience scratchy feeling at time with swallowing but overall has been doing well. He is able to tolerate swallowing pills without any difficulty. He has not done as well with use of passy muir valve. He was suppose to follow up visit with Dr. Gordan Payment on 04/22/2018 for reassessment but appears postpone due to COVID-19. He states he still has excessive mucous at times requiring him to clean out his mouth.     He was diagnosed with hepatitis C April 2019 while undergoing work-up prior to his trach and PEG. He notes that he had 2 surgeries at age 1 in his early 52s that required blood transfusions. He denies IV drug use or tattoos.  He also served in [employer] and was [place] Labs from September 2019 revealed a hepatitis C viral load greater than 1.4 million and genotype 1a. He denies jaundice, ascites or episodes of confusion. He continues to suffer with pruritis. Prescribed hydroxyzine which has seemed to help some. He is requesting RX refill.     REVIEW OF SYSTEMS:   All systems reviewed and negative except as noted above under HPI.     Liver Section:  HCC SURVEILLANCE 12/2017: NO LIVER MASSES  FIBROSCAN 12/2017: 11.7 KPS C/W STAGE F3 ADVANCED FIBROSIS  Chronic hepatitis C, treatment naive genotype 1A  Baseline HCV RNA: 0,865,784 IU/mL    Immunity status hepatitis A and B: #1 Twinrix administered 03/18/2018  HIV status: non reactive    PAST MEDICAL HISTORY:    Past Medical History:   Diagnosis Date   ??? Hepatitis C    ??? Hypertension    ??? Larynx cancer (CMS-HCC)      PAST SURGICAL HISTORY:  Past Surgical History:   Procedure Laterality Date   ??? IR INSERT G-TUBE PERCUTANEOUS  05/29/2017    IR INSERT G-TUBE PERCUTANEOUS 05/29/2017 Gwenlyn Fudge, MD IMG VIR H&V [facility]   ??? PR BRONCHOSCOPY,DIAGNOSTIC  04/05/2017    Procedure: Bronchoscopy, Rigid Or Flexible, W/Wo Fluoroscopic Guidance; Diagnostic, With Cell Washing, When Performed;  Surgeon: Biagio Quint, MD;  Location: MAIN OR [facility];  Service: ENT   ??? PR ESOPHAGOSCOPY,DIAGNOSTIC  04/05/2017    Procedure: Esophagoscopy, Rigid Or Flexible; Diagnostic, W/Wo Collection Of Specimen(S) By Brushing Or Washing;  Surgeon: Biagio Quint, MD;  Location: MAIN OR [facility];  Service: ENT   ??? PR ESOPHAGOSCOPY,DIAGNOSTIC N/A 12/23/2017    Procedure: ESOPHAGOSCOPY, RIGID OR FLEXIBLE; DIAGNOSTIC, W/WO COLLECTION OF SPECIMEN(S) BY BRUSHING OR WASHING;  Surgeon: Biagio Quint, MD;  Location: MAIN OR [facility];  Service: ENT   ??? PR ESOPHAGOSCOPY,DILATION OVER GUIDE N/A 12/23/2017    Procedure: ESOPHAGOSCOPY, RIGID OR FLEXIBLE; WITH INSERTION OF GUIDE WIRE FOLLOWED BY DILATION OVER GUIDE WIRE;  Surgeon: Biagio Quint, MD;  Location: MAIN OR [facility];  Service: ENT   ??? PR LARYNGOSCOPY,DIRCT,OP,BIOPSY N/A 12/23/2017    Procedure: LARYNGOSCOPY, DIRECT, OPERATIVE, WITH BIOPSY;  Surgeon: Biagio Quint, MD;  Location: MAIN OR [facility];  Service: ENT   ??? PR LARYNGOSCOPY,DIRECT,DIAGNOSTIC N/A 04/05/2017    Procedure: LARYNGOSCOPY DIRECT, WITH OR WITHOUT TRACHEOSCOPY; DIAGNOSTIC, EXCEPT NEWBORN;  Surgeon: Biagio Quint, MD;  Location: MAIN OR [facility];  Service: ENT   ??? PR TRACHEOSTOMY, PLANNED N/A 04/05/2017    Procedure: TRACHEOSTOMY PLANNED (SEPART PROC);  Surgeon: Biagio Quint, MD;  Location: MAIN OR [facility];  Service: ENT   ??? SPINE SURGERY       MEDICATIONS:  Current Outpatient Medications   Medication Sig Dispense Refill   ??? escitalopram oxalate (LEXAPRO) 20 MG tablet Take 1 tablet (20 mg total) by mouth daily. 90 tablet 1   ??? famotidine (PEPCID) 20 MG tablet Take 1 tablet (20 mg total) by mouth Two (2) times a day. 180 tablet 1   ??? fluticasone propionate (FLONASE) 50 mcg/actuation nasal spray 1 spray by Each Nare route two (2) times a day. 16 g 6   ??? gabapentin (NEURONTIN) 300 MG capsule Take 1 capsule (300 mg total) by mouth nightly. 90 capsule 1   ??? hydrOXYzine (ATARAX) 25 MG tablet Take 1 tablet (25 mg total) by mouth every eight (8) hours as needed for itching. 60 tablet 1   ??? ledipasvir 90 mg-sofosbuvir 400 mg (HARVONI) 90-400 mg tablet Take 1 tablet by mouth daily. 28 tablet 2   ??? lisinopril (PRINIVIL,ZESTRIL) 10 MG tablet Take 1 tablet (10 mg total) by mouth daily. 30 tablet 11   ??? methylPREDNISolone (MEDROL DOSEPACK) 4 mg tablet Take 1 tablet (4 mg total) by mouth daily. follow package directions 1 Package 0   ??? oxyCODONE (ROXICODONE) 5 MG immediate release tablet Take 1 tablet (5 mg total) by mouth every six (6) hours as needed for pain. 120 tablet 0   ??? prochlorperazine (COMPAZINE) 10 MG tablet Take 1 tablet (10 mg total) by mouth every eight (8) hours as needed for nausea. 180 tablet 1   ??? tamsulosin (FLOMAX) 0.4 mg capsule Take 1 capsule (0.4 mg total) by mouth nightly. 90 capsule 1     No current facility-administered medications for this visit.    Noted Atarax only added to list today during OV.    ALLERGIES:    Patient has no known allergies.  SOCIAL HISTORY:  Social History     Socioeconomic History   ??? Marital status: Married     Spouse name: Not on file   ??? Number of children: Not on file   ??? Years of education: Not on file   ??? Highest education level: Not on file   Occupational History   ??? Not on file   Social Needs   ??? Financial resource strain: Not on file   ??? Food insecurity     Worry: Not on file     Inability: Not on file   ??? Transportation needs     Medical: Not on file     Non-medical: Not on file   Tobacco Use   ??? Smoking status: Former Smoker     Types: Cigarettes     Last attempt to quit: 03/14/2013     Years since quitting: 5.2   ??? Smokeless tobacco: Never Used   Substance and Sexual Activity   ??? Alcohol use: Not Currently     Comment: quit five to six years ago. I loved my beer. Drank 12 pack daily x years.    ??? Drug use: Yes     Types: Marijuana     Comment: MJ use daily if have.    ??? Sexual activity: Not Currently     Partners: Female   Lifestyle   ??? Physical activity     Days per week: Not on file     Minutes per session: Not on file   ??? Stress: Not on file   Relationships   ??? Social Wellsite geologist on phone: Not on file     Gets together: Not on file     Attends religious service: Not on file     Active member of club or organization: Not on file     Attends meetings of clubs or organizations: Not on file     Relationship status: Not on file Other Topics Concern   ??? Not on file   Social History Narrative    Quit smoking and drinking 3 years ago. Previously smoked 1.5-2 ppd x 30+ years. Drank a 12 pack of beer per day up until 3 years ago. Now staying with his daughter who is a single working mother in [place]. Wife is 12 years older than him with hearing loss and dementia. She has also moved in with their daughter. He is retired from [employer] job.    His wife and all three children have undergone HCV screening with non reactive results.     FAMILY HISTORY:    Family history is unknown by patient.      VITAL SIGNS:  VS and PE were deferred today given the nature of today's visit.     Laboratory results:  Patient declines laboratory studies being drawn at this time. Will proceed with labs at [facility] visit.      ASSESSMENT/PLAN:     1. Chronic hepatitis C, treatment naive with genotype 1A. + advanced fibrosis of liver. Fibroscan consistent with F3.   Mr. Javier Clark is a 58 y.o. year old Caucasian male with PMHx of laryngeal cancer s/p local radiation and tracheotomy and PEG whose chief complaint is treatment follow up ~ TW #12. He has seven tablets left to complete this twelve weeks. He has tolerated treatment well overall. Denies any alcohol use. He continues to experience pruritis. Symptoms present prior to starting DAA treatment. Better controlled with Atarax then benadryl. Request new RX.  At the time of his OV on 12/17/2017, the decision was made to post pone proceeding forward with HCV treatment due to the fact he was experiencing difficulty swallowing and continued use of G-tube. No use of G-tube for past few months prior to starting treatment. + weight gain. Janina Mayo remains in place.     ~ HCV safety and MELD labs will be ordered at the time of his SVR visit per patient request.   ~ Office follow up three months per txment protocol.   ~ No alcohol use.     2. Transmission counseling: All family members have been screened as noted above and non reactive results.     3. HCC screening: New order placed for abdominal ultrasound to be done same date of his next OV. Warrants surveillance every six months based on advanced fibrosis.     4.  Immunity status hepatitis A and B: #1 Twinrix vaccine administered. Warrant #2 next visit.      5. HIV status: non reactive.     6. Chronic pruritis: RX refill for Atarax 25 mg tablet with directions of one tablet every 8 hours as needed. #60 with one refill.     All patient's questions were answered to his satisfaction during phone visit today.     I spent 20  minutes on the phone with the patient. I spent an additional 20 minutes on pre- and post-visit activities.     The patient was physically located in [place] or a state in which I am permitted to provide care. The patient and/or parent/guardian understood that s/he may incur co-pays and cost sharing, and agreed to the telemedicine visit. The visit was completed via phone and/or video, which was appropriate and reasonable under the circumstances given the patient's presentation at the time.    The patient and/or parent/guardian has been advised of the potential risks and limitations of this mode of treatment (including, but not limited to, the absence of in-person examination) and has agreed to be treated using telemedicine. The patient's/patient's family's questions regarding telemedicine have been answered.     If the phone/video visit was completed in an ambulatory setting, the patient and/or parent/guardian has also been advised to contact their provider???s office for worsening conditions, and seek emergency medical treatment and/or call 911 if the patient deems either necessary.    Rodman Key, DNP, FNP-BC  [facility]  [id number]  [place] [address]  Phone [phone]

## 2018-06-17 NOTE — Unmapped (Signed)
error 

## 2018-06-19 MED ORDER — OXYCODONE 5 MG TABLET
ORAL_TABLET | Freq: Four times a day (QID) | ORAL | 0 refills | 0 days | Status: CP | PRN
Start: 2018-06-19 — End: 2018-07-23

## 2018-06-22 ENCOUNTER — Institutional Professional Consult (permissible substitution): Admit: 2018-06-22 | Discharge: 2018-06-23 | Payer: MEDICARE

## 2018-06-22 DIAGNOSIS — R1310 Dysphagia, unspecified: Secondary | ICD-10-CM

## 2018-06-22 DIAGNOSIS — C329 Malignant neoplasm of larynx, unspecified: Principal | ICD-10-CM

## 2018-06-22 DIAGNOSIS — Z930 Tracheostomy status: Secondary | ICD-10-CM

## 2018-07-20 ENCOUNTER — Ambulatory Visit: Admit: 2018-07-20 | Discharge: 2018-07-21 | Payer: MEDICARE

## 2018-07-20 DIAGNOSIS — Z930 Tracheostomy status: Secondary | ICD-10-CM

## 2018-07-20 DIAGNOSIS — C329 Malignant neoplasm of larynx, unspecified: Principal | ICD-10-CM

## 2018-07-20 DIAGNOSIS — R6883 Chills (without fever): Secondary | ICD-10-CM | POA: Diagnosis not present

## 2018-07-20 DIAGNOSIS — R61 Generalized hyperhidrosis: Secondary | ICD-10-CM | POA: Diagnosis not present

## 2018-07-20 DIAGNOSIS — I10 Essential (primary) hypertension: Secondary | ICD-10-CM | POA: Diagnosis not present

## 2018-07-20 DIAGNOSIS — C321 Malignant neoplasm of supraglottis: Secondary | ICD-10-CM | POA: Diagnosis not present

## 2018-07-20 DIAGNOSIS — Z79899 Other long term (current) drug therapy: Secondary | ICD-10-CM | POA: Diagnosis not present

## 2018-07-20 DIAGNOSIS — B1920 Unspecified viral hepatitis C without hepatic coma: Secondary | ICD-10-CM | POA: Diagnosis not present

## 2018-07-20 DIAGNOSIS — Z87891 Personal history of nicotine dependence: Secondary | ICD-10-CM | POA: Diagnosis not present

## 2018-07-20 DIAGNOSIS — R0600 Dyspnea, unspecified: Secondary | ICD-10-CM | POA: Diagnosis not present

## 2018-07-23 MED ORDER — OXYCODONE 5 MG TABLET
ORAL_TABLET | Freq: Four times a day (QID) | ORAL | 0 refills | 0.00000 days | Status: CP | PRN
Start: 2018-07-23 — End: 2018-08-20

## 2018-08-05 MED ORDER — HYDROXYZINE HCL 25 MG TABLET
ORAL_TABLET | 0 refills | 0 days | Status: CP
Start: 2018-08-05 — End: ?

## 2018-08-17 DIAGNOSIS — R221 Localized swelling, mass and lump, neck: Secondary | ICD-10-CM | POA: Diagnosis not present

## 2018-08-17 DIAGNOSIS — Z930 Tracheostomy status: Secondary | ICD-10-CM | POA: Diagnosis not present

## 2018-08-22 MED ORDER — OXYCODONE 5 MG TABLET
ORAL_TABLET | Freq: Four times a day (QID) | ORAL | 0 refills | 30.00000 days | Status: CP | PRN
Start: 2018-08-22 — End: 2018-09-21

## 2018-09-07 ENCOUNTER — Ambulatory Visit: Admit: 2018-09-07 | Discharge: 2018-09-07 | Payer: MEDICARE

## 2018-09-07 DIAGNOSIS — R1310 Dysphagia, unspecified: Secondary | ICD-10-CM

## 2018-09-07 DIAGNOSIS — C329 Malignant neoplasm of larynx, unspecified: Principal | ICD-10-CM

## 2018-09-07 DIAGNOSIS — Z930 Tracheostomy status: Secondary | ICD-10-CM

## 2018-09-07 DIAGNOSIS — I10 Essential (primary) hypertension: Secondary | ICD-10-CM | POA: Diagnosis not present

## 2018-09-07 DIAGNOSIS — B1920 Unspecified viral hepatitis C without hepatic coma: Secondary | ICD-10-CM | POA: Diagnosis not present

## 2018-09-07 DIAGNOSIS — Z79899 Other long term (current) drug therapy: Secondary | ICD-10-CM | POA: Diagnosis not present

## 2018-09-07 DIAGNOSIS — Z87891 Personal history of nicotine dependence: Secondary | ICD-10-CM | POA: Diagnosis not present

## 2018-09-07 MED ORDER — HYDROXYZINE HCL 25 MG TABLET
ORAL_TABLET | Freq: Three times a day (TID) | ORAL | 1 refills | 30 days | Status: CP | PRN
Start: 2018-09-07 — End: ?

## 2018-09-18 ENCOUNTER — Institutional Professional Consult (permissible substitution): Admit: 2018-09-18 | Discharge: 2018-09-19 | Payer: MEDICARE

## 2018-09-18 DIAGNOSIS — I10 Essential (primary) hypertension: Secondary | ICD-10-CM

## 2018-09-18 DIAGNOSIS — C329 Malignant neoplasm of larynx, unspecified: Secondary | ICD-10-CM

## 2018-09-18 DIAGNOSIS — G893 Neoplasm related pain (acute) (chronic): Secondary | ICD-10-CM | POA: Diagnosis not present

## 2018-09-18 MED ORDER — OXYCODONE 5 MG TABLET
ORAL_TABLET | Freq: Four times a day (QID) | ORAL | 0 refills | 30.00000 days | Status: CP | PRN
Start: 2018-09-18 — End: 2018-10-18

## 2018-09-27 MED ORDER — TAMSULOSIN 0.4 MG CAPSULE
ORAL_CAPSULE | Freq: Every evening | ORAL | 1 refills | 90 days | Status: CP
Start: 2018-09-27 — End: 2019-09-27

## 2018-09-27 MED ORDER — GABAPENTIN 300 MG CAPSULE
ORAL_CAPSULE | Freq: Every evening | ORAL | 1 refills | 90 days | Status: CP
Start: 2018-09-27 — End: 2019-09-27

## 2018-09-28 ENCOUNTER — Ambulatory Visit: Admit: 2018-09-28 | Discharge: 2018-09-29 | Payer: MEDICARE

## 2018-09-28 DIAGNOSIS — R918 Other nonspecific abnormal finding of lung field: Secondary | ICD-10-CM

## 2018-09-28 DIAGNOSIS — Q313 Laryngocele: Secondary | ICD-10-CM

## 2018-09-28 DIAGNOSIS — R9389 Abnormal findings on diagnostic imaging of other specified body structures: Secondary | ICD-10-CM

## 2018-09-28 DIAGNOSIS — C329 Malignant neoplasm of larynx, unspecified: Secondary | ICD-10-CM

## 2018-09-28 DIAGNOSIS — Z8521 Personal history of malignant neoplasm of larynx: Secondary | ICD-10-CM

## 2018-09-28 DIAGNOSIS — Z930 Tracheostomy status: Secondary | ICD-10-CM

## 2018-09-28 DIAGNOSIS — R911 Solitary pulmonary nodule: Secondary | ICD-10-CM

## 2018-09-28 DIAGNOSIS — R221 Localized swelling, mass and lump, neck: Secondary | ICD-10-CM | POA: Diagnosis not present

## 2018-09-28 DIAGNOSIS — M799 Soft tissue disorder, unspecified: Secondary | ICD-10-CM | POA: Diagnosis not present

## 2018-09-29 DIAGNOSIS — B182 Chronic viral hepatitis C: Secondary | ICD-10-CM

## 2018-09-29 DIAGNOSIS — K740 Hepatic fibrosis: Secondary | ICD-10-CM

## 2018-09-29 DIAGNOSIS — L299 Pruritus, unspecified: Secondary | ICD-10-CM | POA: Diagnosis not present

## 2018-10-06 ENCOUNTER — Ambulatory Visit: Admit: 2018-10-06 | Discharge: 2018-10-07 | Payer: MEDICARE

## 2018-10-06 DIAGNOSIS — R9389 Abnormal findings on diagnostic imaging of other specified body structures: Secondary | ICD-10-CM

## 2018-10-06 DIAGNOSIS — R911 Solitary pulmonary nodule: Secondary | ICD-10-CM

## 2018-10-07 DIAGNOSIS — E669 Obesity, unspecified: Secondary | ICD-10-CM

## 2018-10-07 DIAGNOSIS — Z8521 Personal history of malignant neoplasm of larynx: Secondary | ICD-10-CM

## 2018-10-07 DIAGNOSIS — Z923 Personal history of irradiation: Secondary | ICD-10-CM

## 2018-10-07 DIAGNOSIS — I10 Essential (primary) hypertension: Secondary | ICD-10-CM

## 2018-10-07 DIAGNOSIS — C3432 Malignant neoplasm of lower lobe, left bronchus or lung: Secondary | ICD-10-CM

## 2018-10-07 DIAGNOSIS — B1920 Unspecified viral hepatitis C without hepatic coma: Secondary | ICD-10-CM

## 2018-10-07 DIAGNOSIS — K219 Gastro-esophageal reflux disease without esophagitis: Secondary | ICD-10-CM

## 2018-10-07 DIAGNOSIS — R918 Other nonspecific abnormal finding of lung field: Secondary | ICD-10-CM

## 2018-10-12 ENCOUNTER — Ambulatory Visit: Admit: 2018-10-12 | Discharge: 2018-10-13 | Payer: MEDICARE | Attending: Family | Primary: Family

## 2018-10-12 ENCOUNTER — Ambulatory Visit: Admit: 2018-10-12 | Discharge: 2018-10-13 | Payer: MEDICARE

## 2018-10-12 DIAGNOSIS — Z20828 Contact with and (suspected) exposure to other viral communicable diseases: Secondary | ICD-10-CM

## 2018-10-12 DIAGNOSIS — R9389 Abnormal findings on diagnostic imaging of other specified body structures: Secondary | ICD-10-CM

## 2018-10-12 DIAGNOSIS — Z79899 Other long term (current) drug therapy: Secondary | ICD-10-CM

## 2018-10-12 DIAGNOSIS — R911 Solitary pulmonary nodule: Secondary | ICD-10-CM

## 2018-10-12 DIAGNOSIS — Z01812 Encounter for preprocedural laboratory examination: Secondary | ICD-10-CM

## 2018-10-12 DIAGNOSIS — Z1159 Encounter for screening for other viral diseases: Secondary | ICD-10-CM

## 2018-10-14 DIAGNOSIS — Z8521 Personal history of malignant neoplasm of larynx: Secondary | ICD-10-CM

## 2018-10-14 DIAGNOSIS — Z923 Personal history of irradiation: Secondary | ICD-10-CM

## 2018-10-14 DIAGNOSIS — B1920 Unspecified viral hepatitis C without hepatic coma: Secondary | ICD-10-CM

## 2018-10-14 DIAGNOSIS — K219 Gastro-esophageal reflux disease without esophagitis: Secondary | ICD-10-CM

## 2018-10-14 DIAGNOSIS — R918 Other nonspecific abnormal finding of lung field: Secondary | ICD-10-CM

## 2018-10-14 DIAGNOSIS — I10 Essential (primary) hypertension: Secondary | ICD-10-CM

## 2018-10-14 DIAGNOSIS — C3432 Malignant neoplasm of lower lobe, left bronchus or lung: Secondary | ICD-10-CM

## 2018-10-14 DIAGNOSIS — E669 Obesity, unspecified: Secondary | ICD-10-CM

## 2018-10-15 ENCOUNTER — Ambulatory Visit: Admit: 2018-10-15 | Discharge: 2018-10-15 | Payer: MEDICARE

## 2018-10-15 ENCOUNTER — Encounter
Admit: 2018-10-15 | Discharge: 2018-10-15 | Payer: MEDICARE | Attending: Student in an Organized Health Care Education/Training Program | Primary: Student in an Organized Health Care Education/Training Program

## 2018-10-15 DIAGNOSIS — E669 Obesity, unspecified: Secondary | ICD-10-CM

## 2018-10-15 DIAGNOSIS — B1920 Unspecified viral hepatitis C without hepatic coma: Secondary | ICD-10-CM

## 2018-10-15 DIAGNOSIS — C3432 Malignant neoplasm of lower lobe, left bronchus or lung: Secondary | ICD-10-CM

## 2018-10-15 DIAGNOSIS — I10 Essential (primary) hypertension: Secondary | ICD-10-CM

## 2018-10-15 DIAGNOSIS — R918 Other nonspecific abnormal finding of lung field: Secondary | ICD-10-CM

## 2018-10-15 DIAGNOSIS — Z8521 Personal history of malignant neoplasm of larynx: Secondary | ICD-10-CM

## 2018-10-15 DIAGNOSIS — K219 Gastro-esophageal reflux disease without esophagitis: Secondary | ICD-10-CM

## 2018-10-15 DIAGNOSIS — Z923 Personal history of irradiation: Secondary | ICD-10-CM

## 2018-10-15 DIAGNOSIS — D491 Neoplasm of unspecified behavior of respiratory system: Secondary | ICD-10-CM | POA: Diagnosis not present

## 2018-10-15 DIAGNOSIS — C3492 Malignant neoplasm of unspecified part of left bronchus or lung: Secondary | ICD-10-CM | POA: Diagnosis not present

## 2018-10-18 DIAGNOSIS — C329 Malignant neoplasm of larynx, unspecified: Secondary | ICD-10-CM

## 2018-10-20 DIAGNOSIS — C3492 Malignant neoplasm of unspecified part of left bronchus or lung: Secondary | ICD-10-CM

## 2018-10-20 MED ORDER — OXYCODONE 5 MG TABLET
ORAL_TABLET | Freq: Four times a day (QID) | ORAL | 0 refills | 30 days | Status: CP | PRN
Start: 2018-10-20 — End: 2018-11-19

## 2018-10-28 ENCOUNTER — Ambulatory Visit
Admit: 2018-10-28 | Discharge: 2018-10-29 | Payer: MEDICARE | Attending: Hematology & Oncology | Primary: Hematology & Oncology

## 2018-10-28 DIAGNOSIS — C3492 Malignant neoplasm of unspecified part of left bronchus or lung: Principal | ICD-10-CM

## 2018-11-10 ENCOUNTER — Ambulatory Visit
Admit: 2018-11-10 | Discharge: 2018-11-14 | Payer: MEDICARE | Attending: Radiation Oncology | Primary: Radiation Oncology

## 2018-11-10 ENCOUNTER — Ambulatory Visit: Admit: 2018-11-10 | Discharge: 2018-11-11 | Payer: MEDICARE | Attending: Surgery | Primary: Surgery

## 2018-11-10 DIAGNOSIS — C3492 Malignant neoplasm of unspecified part of left bronchus or lung: Principal | ICD-10-CM

## 2018-11-10 DIAGNOSIS — C3402 Malignant neoplasm of left main bronchus: Secondary | ICD-10-CM | POA: Diagnosis not present

## 2018-11-16 ENCOUNTER — Ambulatory Visit
Admit: 2018-11-16 | Discharge: 2018-12-14 | Payer: MEDICARE | Attending: Hematology & Oncology | Primary: Hematology & Oncology

## 2018-11-16 ENCOUNTER — Ambulatory Visit
Admit: 2018-11-16 | Discharge: 2018-12-14 | Payer: MEDICARE | Attending: Radiation Oncology | Primary: Radiation Oncology

## 2018-11-16 ENCOUNTER — Other Ambulatory Visit: Admit: 2018-11-16 | Discharge: 2018-12-14 | Payer: MEDICARE

## 2018-11-16 ENCOUNTER — Ambulatory Visit: Admit: 2018-11-16 | Discharge: 2018-12-14 | Payer: MEDICARE

## 2018-11-16 ENCOUNTER — Telehealth: Admit: 2018-11-16 | Discharge: 2018-12-14 | Payer: MEDICARE | Attending: Adult Health | Primary: Adult Health

## 2018-11-17 DIAGNOSIS — C329 Malignant neoplasm of larynx, unspecified: Principal | ICD-10-CM

## 2018-11-17 DIAGNOSIS — C3432 Malignant neoplasm of lower lobe, left bronchus or lung: Secondary | ICD-10-CM | POA: Diagnosis not present

## 2018-11-17 DIAGNOSIS — Z510 Encounter for antineoplastic radiation therapy: Secondary | ICD-10-CM | POA: Diagnosis not present

## 2018-11-19 DIAGNOSIS — C3432 Malignant neoplasm of lower lobe, left bronchus or lung: Secondary | ICD-10-CM | POA: Diagnosis not present

## 2018-11-19 DIAGNOSIS — Z510 Encounter for antineoplastic radiation therapy: Secondary | ICD-10-CM | POA: Diagnosis not present

## 2018-11-19 MED ORDER — OXYCODONE 5 MG TABLET
ORAL_TABLET | Freq: Four times a day (QID) | ORAL | 0 refills | 30 days | Status: CP | PRN
Start: 2018-11-19 — End: 2018-12-19

## 2018-11-20 ENCOUNTER — Ambulatory Visit: Admit: 2018-11-20 | Discharge: 2018-11-21 | Payer: MEDICARE

## 2018-11-20 DIAGNOSIS — C3492 Malignant neoplasm of unspecified part of left bronchus or lung: Secondary | ICD-10-CM | POA: Diagnosis not present

## 2018-11-21 DIAGNOSIS — Z510 Encounter for antineoplastic radiation therapy: Secondary | ICD-10-CM | POA: Diagnosis not present

## 2018-11-21 DIAGNOSIS — C3432 Malignant neoplasm of lower lobe, left bronchus or lung: Secondary | ICD-10-CM | POA: Diagnosis not present

## 2018-11-25 ENCOUNTER — Ambulatory Visit: Admit: 2018-11-25 | Discharge: 2018-11-26 | Attending: Radiation Oncology | Primary: Radiation Oncology

## 2018-11-25 DIAGNOSIS — C329 Malignant neoplasm of larynx, unspecified: Principal | ICD-10-CM

## 2018-11-25 DIAGNOSIS — C3402 Malignant neoplasm of left main bronchus: Principal | ICD-10-CM

## 2018-11-25 DIAGNOSIS — Z510 Encounter for antineoplastic radiation therapy: Secondary | ICD-10-CM | POA: Diagnosis not present

## 2018-11-25 DIAGNOSIS — C3432 Malignant neoplasm of lower lobe, left bronchus or lung: Secondary | ICD-10-CM | POA: Diagnosis not present

## 2018-11-25 MED ORDER — PROCHLORPERAZINE MALEATE 10 MG TABLET
ORAL_TABLET | Freq: Four times a day (QID) | ORAL | 2 refills | 8.00000 days | Status: CP | PRN
Start: 2018-11-25 — End: 2019-11-25

## 2018-11-25 MED ORDER — CYCLOBENZAPRINE 5 MG TABLET
ORAL_TABLET | Freq: Three times a day (TID) | ORAL | 0 refills | 8.00000 days | Status: CP | PRN
Start: 2018-11-25 — End: ?

## 2018-11-26 ENCOUNTER — Ambulatory Visit: Admit: 2018-11-26 | Discharge: 2018-11-27

## 2018-11-26 DIAGNOSIS — Z510 Encounter for antineoplastic radiation therapy: Secondary | ICD-10-CM | POA: Diagnosis not present

## 2018-11-26 DIAGNOSIS — C3432 Malignant neoplasm of lower lobe, left bronchus or lung: Secondary | ICD-10-CM | POA: Diagnosis not present

## 2018-11-27 ENCOUNTER — Ambulatory Visit: Admit: 2018-11-27 | Discharge: 2018-11-28

## 2018-11-27 DIAGNOSIS — C3432 Malignant neoplasm of lower lobe, left bronchus or lung: Secondary | ICD-10-CM | POA: Diagnosis not present

## 2018-11-27 DIAGNOSIS — Z510 Encounter for antineoplastic radiation therapy: Secondary | ICD-10-CM | POA: Diagnosis not present

## 2018-11-30 ENCOUNTER — Ambulatory Visit: Admit: 2018-11-30 | Discharge: 2018-12-01

## 2018-11-30 DIAGNOSIS — C3432 Malignant neoplasm of lower lobe, left bronchus or lung: Secondary | ICD-10-CM | POA: Diagnosis not present

## 2018-11-30 DIAGNOSIS — Z510 Encounter for antineoplastic radiation therapy: Secondary | ICD-10-CM | POA: Diagnosis not present

## 2018-12-01 ENCOUNTER — Ambulatory Visit: Admit: 2018-12-01 | Discharge: 2018-12-02

## 2018-12-01 DIAGNOSIS — Z510 Encounter for antineoplastic radiation therapy: Secondary | ICD-10-CM | POA: Diagnosis not present

## 2018-12-01 DIAGNOSIS — C3432 Malignant neoplasm of lower lobe, left bronchus or lung: Secondary | ICD-10-CM | POA: Diagnosis not present

## 2018-12-02 ENCOUNTER — Ambulatory Visit: Admit: 2018-12-02 | Discharge: 2018-12-03

## 2018-12-02 ENCOUNTER — Ambulatory Visit: Admit: 2018-12-02 | Discharge: 2018-12-02 | Payer: MEDICARE

## 2018-12-02 ENCOUNTER — Other Ambulatory Visit: Admit: 2018-12-02 | Discharge: 2018-12-02 | Payer: MEDICARE

## 2018-12-02 ENCOUNTER — Telehealth: Admit: 2018-12-02 | Discharge: 2018-12-02 | Payer: MEDICARE | Attending: Adult Health | Primary: Adult Health

## 2018-12-02 DIAGNOSIS — C3402 Malignant neoplasm of left main bronchus: Principal | ICD-10-CM

## 2018-12-02 DIAGNOSIS — C329 Malignant neoplasm of larynx, unspecified: Principal | ICD-10-CM

## 2018-12-02 DIAGNOSIS — Z5111 Encounter for antineoplastic chemotherapy: Secondary | ICD-10-CM | POA: Diagnosis not present

## 2018-12-02 DIAGNOSIS — I10 Essential (primary) hypertension: Secondary | ICD-10-CM | POA: Diagnosis not present

## 2018-12-02 DIAGNOSIS — M542 Cervicalgia: Secondary | ICD-10-CM | POA: Diagnosis not present

## 2018-12-02 DIAGNOSIS — Z8619 Personal history of other infectious and parasitic diseases: Secondary | ICD-10-CM | POA: Diagnosis not present

## 2018-12-02 DIAGNOSIS — R252 Cramp and spasm: Secondary | ICD-10-CM | POA: Diagnosis not present

## 2018-12-02 DIAGNOSIS — C3432 Malignant neoplasm of lower lobe, left bronchus or lung: Secondary | ICD-10-CM | POA: Diagnosis not present

## 2018-12-02 DIAGNOSIS — R109 Unspecified abdominal pain: Secondary | ICD-10-CM | POA: Diagnosis not present

## 2018-12-02 MED ORDER — OXYCODONE 5 MG TABLET
ORAL_TABLET | ORAL | 0 refills | 2.00000 days | Status: CP | PRN
Start: 2018-12-02 — End: ?

## 2018-12-03 ENCOUNTER — Ambulatory Visit: Admit: 2018-12-03 | Discharge: 2018-12-04

## 2018-12-03 DIAGNOSIS — C3432 Malignant neoplasm of lower lobe, left bronchus or lung: Secondary | ICD-10-CM | POA: Diagnosis not present

## 2018-12-03 DIAGNOSIS — Z510 Encounter for antineoplastic radiation therapy: Secondary | ICD-10-CM | POA: Diagnosis not present

## 2018-12-04 ENCOUNTER — Ambulatory Visit: Admit: 2018-12-04 | Discharge: 2018-12-05

## 2018-12-04 DIAGNOSIS — C3432 Malignant neoplasm of lower lobe, left bronchus or lung: Secondary | ICD-10-CM | POA: Diagnosis not present

## 2018-12-04 DIAGNOSIS — Z510 Encounter for antineoplastic radiation therapy: Secondary | ICD-10-CM | POA: Diagnosis not present

## 2018-12-06 ENCOUNTER — Ambulatory Visit: Admit: 2018-12-06 | Discharge: 2018-12-07

## 2018-12-06 ENCOUNTER — Ambulatory Visit: Admit: 2018-12-06 | Discharge: 2018-12-07 | Payer: MEDICARE

## 2018-12-06 DIAGNOSIS — M542 Cervicalgia: Secondary | ICD-10-CM | POA: Diagnosis not present

## 2018-12-06 DIAGNOSIS — C3432 Malignant neoplasm of lower lobe, left bronchus or lung: Secondary | ICD-10-CM | POA: Diagnosis not present

## 2018-12-06 DIAGNOSIS — M47812 Spondylosis without myelopathy or radiculopathy, cervical region: Secondary | ICD-10-CM | POA: Diagnosis not present

## 2018-12-06 DIAGNOSIS — M9951 Intervertebral disc stenosis of neural canal of cervical region: Secondary | ICD-10-CM | POA: Diagnosis not present

## 2018-12-06 DIAGNOSIS — M4802 Spinal stenosis, cervical region: Secondary | ICD-10-CM | POA: Diagnosis not present

## 2018-12-06 DIAGNOSIS — R222 Localized swelling, mass and lump, trunk: Secondary | ICD-10-CM | POA: Diagnosis not present

## 2018-12-06 DIAGNOSIS — G959 Disease of spinal cord, unspecified: Secondary | ICD-10-CM | POA: Diagnosis not present

## 2018-12-07 ENCOUNTER — Ambulatory Visit: Admit: 2018-12-07 | Discharge: 2018-12-08 | Payer: MEDICARE | Attending: Family | Primary: Family

## 2018-12-07 ENCOUNTER — Ambulatory Visit: Admit: 2018-12-07 | Discharge: 2018-12-08

## 2018-12-07 ENCOUNTER — Ambulatory Visit: Admit: 2018-12-07 | Discharge: 2018-12-08 | Payer: MEDICARE

## 2018-12-07 DIAGNOSIS — C3402 Malignant neoplasm of left main bronchus: Principal | ICD-10-CM

## 2018-12-07 DIAGNOSIS — R221 Localized swelling, mass and lump, neck: Principal | ICD-10-CM

## 2018-12-07 DIAGNOSIS — M542 Cervicalgia: Principal | ICD-10-CM

## 2018-12-07 DIAGNOSIS — Z930 Tracheostomy status: Principal | ICD-10-CM

## 2018-12-07 DIAGNOSIS — C329 Malignant neoplasm of larynx, unspecified: Principal | ICD-10-CM

## 2018-12-07 DIAGNOSIS — Z859 Personal history of malignant neoplasm, unspecified: Secondary | ICD-10-CM

## 2018-12-07 DIAGNOSIS — C3432 Malignant neoplasm of lower lobe, left bronchus or lung: Secondary | ICD-10-CM | POA: Diagnosis not present

## 2018-12-07 MED ORDER — OXYCODONE 10 MG TABLET: 10 mg | tablet | 0 refills | 10 days | Status: AC

## 2018-12-07 MED ORDER — OXYCODONE 10 MG TABLET
ORAL_TABLET | ORAL | 0 refills | 10.00000 days | Status: CP | PRN
Start: 2018-12-07 — End: 2018-12-07

## 2018-12-08 ENCOUNTER — Ambulatory Visit: Admit: 2018-12-08 | Discharge: 2018-12-09

## 2018-12-08 DIAGNOSIS — G893 Neoplasm related pain (acute) (chronic): Secondary | ICD-10-CM | POA: Diagnosis not present

## 2018-12-08 DIAGNOSIS — Z510 Encounter for antineoplastic radiation therapy: Secondary | ICD-10-CM | POA: Diagnosis not present

## 2018-12-08 DIAGNOSIS — R252 Cramp and spasm: Secondary | ICD-10-CM | POA: Diagnosis not present

## 2018-12-08 DIAGNOSIS — Z8521 Personal history of malignant neoplasm of larynx: Secondary | ICD-10-CM | POA: Diagnosis not present

## 2018-12-08 DIAGNOSIS — Z9221 Personal history of antineoplastic chemotherapy: Secondary | ICD-10-CM | POA: Diagnosis not present

## 2018-12-08 DIAGNOSIS — R109 Unspecified abdominal pain: Secondary | ICD-10-CM | POA: Diagnosis not present

## 2018-12-08 DIAGNOSIS — Z923 Personal history of irradiation: Secondary | ICD-10-CM | POA: Diagnosis not present

## 2018-12-08 DIAGNOSIS — Z8619 Personal history of other infectious and parasitic diseases: Secondary | ICD-10-CM | POA: Diagnosis not present

## 2018-12-08 DIAGNOSIS — C7951 Secondary malignant neoplasm of bone: Secondary | ICD-10-CM | POA: Diagnosis not present

## 2018-12-08 DIAGNOSIS — C3432 Malignant neoplasm of lower lobe, left bronchus or lung: Secondary | ICD-10-CM | POA: Diagnosis not present

## 2018-12-09 ENCOUNTER — Ambulatory Visit: Admit: 2018-12-09 | Discharge: 2018-12-10

## 2018-12-09 DIAGNOSIS — C329 Malignant neoplasm of larynx, unspecified: Principal | ICD-10-CM

## 2018-12-09 DIAGNOSIS — C3402 Malignant neoplasm of left main bronchus: Principal | ICD-10-CM

## 2018-12-09 DIAGNOSIS — Z9221 Personal history of antineoplastic chemotherapy: Secondary | ICD-10-CM | POA: Diagnosis not present

## 2018-12-09 DIAGNOSIS — Z510 Encounter for antineoplastic radiation therapy: Secondary | ICD-10-CM | POA: Diagnosis not present

## 2018-12-09 DIAGNOSIS — C7951 Secondary malignant neoplasm of bone: Secondary | ICD-10-CM | POA: Diagnosis not present

## 2018-12-09 DIAGNOSIS — G893 Neoplasm related pain (acute) (chronic): Secondary | ICD-10-CM | POA: Diagnosis not present

## 2018-12-09 DIAGNOSIS — R109 Unspecified abdominal pain: Secondary | ICD-10-CM | POA: Diagnosis not present

## 2018-12-09 DIAGNOSIS — Z923 Personal history of irradiation: Secondary | ICD-10-CM | POA: Diagnosis not present

## 2018-12-09 DIAGNOSIS — C3432 Malignant neoplasm of lower lobe, left bronchus or lung: Secondary | ICD-10-CM | POA: Diagnosis not present

## 2018-12-09 DIAGNOSIS — R252 Cramp and spasm: Secondary | ICD-10-CM | POA: Diagnosis not present

## 2018-12-09 DIAGNOSIS — Z8521 Personal history of malignant neoplasm of larynx: Secondary | ICD-10-CM | POA: Diagnosis not present

## 2018-12-09 DIAGNOSIS — Z8619 Personal history of other infectious and parasitic diseases: Secondary | ICD-10-CM | POA: Diagnosis not present

## 2018-12-12 DIAGNOSIS — C329 Malignant neoplasm of larynx, unspecified: Principal | ICD-10-CM

## 2018-12-12 DIAGNOSIS — L299 Pruritus, unspecified: Principal | ICD-10-CM

## 2018-12-14 ENCOUNTER — Ambulatory Visit: Admit: 2018-12-14 | Discharge: 2018-12-15

## 2018-12-14 DIAGNOSIS — Z510 Encounter for antineoplastic radiation therapy: Secondary | ICD-10-CM | POA: Diagnosis not present

## 2018-12-14 DIAGNOSIS — C3432 Malignant neoplasm of lower lobe, left bronchus or lung: Secondary | ICD-10-CM | POA: Diagnosis not present

## 2018-12-14 DIAGNOSIS — C7951 Secondary malignant neoplasm of bone: Secondary | ICD-10-CM | POA: Diagnosis not present

## 2018-12-15 ENCOUNTER — Ambulatory Visit
Admit: 2018-12-15 | Discharge: 2019-01-14 | Payer: MEDICARE | Attending: Radiation Oncology | Primary: Radiation Oncology

## 2018-12-15 ENCOUNTER — Ambulatory Visit: Admit: 2018-12-15 | Discharge: 2019-01-14 | Payer: MEDICARE

## 2018-12-15 ENCOUNTER — Ambulatory Visit: Admit: 2018-12-15 | Discharge: 2018-12-16

## 2018-12-15 ENCOUNTER — Ambulatory Visit: Admit: 2018-12-15 | Discharge: 2019-01-14 | Payer: MEDICARE | Attending: Internal Medicine | Primary: Internal Medicine

## 2018-12-15 DIAGNOSIS — C7951 Secondary malignant neoplasm of bone: Secondary | ICD-10-CM | POA: Diagnosis not present

## 2018-12-15 DIAGNOSIS — Z510 Encounter for antineoplastic radiation therapy: Secondary | ICD-10-CM | POA: Diagnosis not present

## 2018-12-15 DIAGNOSIS — C3432 Malignant neoplasm of lower lobe, left bronchus or lung: Secondary | ICD-10-CM | POA: Diagnosis not present

## 2018-12-15 MED ORDER — PROCHLORPERAZINE MALEATE 10 MG TABLET
ORAL_TABLET | Freq: Four times a day (QID) | ORAL | 2 refills | 8 days | Status: CP | PRN
Start: 2018-12-15 — End: 2019-12-15

## 2018-12-16 ENCOUNTER — Other Ambulatory Visit: Admit: 2018-12-16 | Discharge: 2018-12-16 | Payer: MEDICARE

## 2018-12-16 ENCOUNTER — Ambulatory Visit: Admit: 2018-12-16 | Discharge: 2018-12-16 | Payer: MEDICARE | Attending: Adult Health | Primary: Adult Health

## 2018-12-16 ENCOUNTER — Ambulatory Visit: Admit: 2018-12-16 | Discharge: 2018-12-16 | Payer: MEDICARE

## 2018-12-16 ENCOUNTER — Ambulatory Visit: Admit: 2018-12-16 | Discharge: 2018-12-17

## 2018-12-16 DIAGNOSIS — C329 Malignant neoplasm of larynx, unspecified: Principal | ICD-10-CM

## 2018-12-16 DIAGNOSIS — R252 Cramp and spasm: Principal | ICD-10-CM

## 2018-12-16 DIAGNOSIS — C3402 Malignant neoplasm of left main bronchus: Principal | ICD-10-CM

## 2018-12-16 DIAGNOSIS — R109 Unspecified abdominal pain: Secondary | ICD-10-CM | POA: Diagnosis not present

## 2018-12-16 DIAGNOSIS — C7951 Secondary malignant neoplasm of bone: Secondary | ICD-10-CM | POA: Diagnosis not present

## 2018-12-16 DIAGNOSIS — C3432 Malignant neoplasm of lower lobe, left bronchus or lung: Secondary | ICD-10-CM | POA: Diagnosis not present

## 2018-12-16 DIAGNOSIS — Z5111 Encounter for antineoplastic chemotherapy: Secondary | ICD-10-CM | POA: Diagnosis not present

## 2018-12-16 DIAGNOSIS — Z79899 Other long term (current) drug therapy: Secondary | ICD-10-CM | POA: Diagnosis not present

## 2018-12-16 DIAGNOSIS — M542 Cervicalgia: Secondary | ICD-10-CM | POA: Diagnosis not present

## 2018-12-16 DIAGNOSIS — I10 Essential (primary) hypertension: Secondary | ICD-10-CM | POA: Diagnosis not present

## 2018-12-16 DIAGNOSIS — G629 Polyneuropathy, unspecified: Secondary | ICD-10-CM | POA: Diagnosis not present

## 2018-12-16 DIAGNOSIS — R112 Nausea with vomiting, unspecified: Secondary | ICD-10-CM | POA: Diagnosis not present

## 2018-12-16 DIAGNOSIS — Z87891 Personal history of nicotine dependence: Secondary | ICD-10-CM | POA: Diagnosis not present

## 2018-12-16 DIAGNOSIS — F1290 Cannabis use, unspecified, uncomplicated: Secondary | ICD-10-CM | POA: Diagnosis not present

## 2018-12-16 DIAGNOSIS — Z510 Encounter for antineoplastic radiation therapy: Secondary | ICD-10-CM | POA: Diagnosis not present

## 2018-12-17 ENCOUNTER — Ambulatory Visit: Admit: 2018-12-17 | Discharge: 2018-12-18

## 2018-12-17 DIAGNOSIS — I10 Essential (primary) hypertension: Principal | ICD-10-CM

## 2018-12-17 DIAGNOSIS — C329 Malignant neoplasm of larynx, unspecified: Principal | ICD-10-CM

## 2018-12-17 DIAGNOSIS — C7951 Secondary malignant neoplasm of bone: Secondary | ICD-10-CM | POA: Diagnosis not present

## 2018-12-17 DIAGNOSIS — Z510 Encounter for antineoplastic radiation therapy: Secondary | ICD-10-CM | POA: Diagnosis not present

## 2018-12-17 DIAGNOSIS — C3432 Malignant neoplasm of lower lobe, left bronchus or lung: Secondary | ICD-10-CM | POA: Diagnosis not present

## 2018-12-18 ENCOUNTER — Ambulatory Visit: Admit: 2018-12-18 | Discharge: 2018-12-19

## 2018-12-18 DIAGNOSIS — C3432 Malignant neoplasm of lower lobe, left bronchus or lung: Secondary | ICD-10-CM | POA: Diagnosis not present

## 2018-12-18 DIAGNOSIS — C7951 Secondary malignant neoplasm of bone: Secondary | ICD-10-CM | POA: Diagnosis not present

## 2018-12-18 DIAGNOSIS — Z510 Encounter for antineoplastic radiation therapy: Secondary | ICD-10-CM | POA: Diagnosis not present

## 2018-12-18 MED ORDER — MORPHINE ER 15 MG TABLET,EXTENDED RELEASE
ORAL_TABLET | Freq: Two times a day (BID) | ORAL | 0 refills | 14.00000 days | Status: CP
Start: 2018-12-18 — End: 2019-01-01

## 2018-12-21 ENCOUNTER — Ambulatory Visit: Admit: 2018-12-21 | Discharge: 2018-12-22

## 2018-12-21 DIAGNOSIS — Z510 Encounter for antineoplastic radiation therapy: Secondary | ICD-10-CM | POA: Diagnosis not present

## 2018-12-21 DIAGNOSIS — C3432 Malignant neoplasm of lower lobe, left bronchus or lung: Secondary | ICD-10-CM | POA: Diagnosis not present

## 2018-12-21 DIAGNOSIS — C7951 Secondary malignant neoplasm of bone: Secondary | ICD-10-CM | POA: Diagnosis not present

## 2018-12-22 ENCOUNTER — Ambulatory Visit: Admit: 2018-12-22 | Discharge: 2018-12-23

## 2018-12-22 DIAGNOSIS — Z510 Encounter for antineoplastic radiation therapy: Secondary | ICD-10-CM | POA: Diagnosis not present

## 2018-12-22 DIAGNOSIS — C3432 Malignant neoplasm of lower lobe, left bronchus or lung: Secondary | ICD-10-CM | POA: Diagnosis not present

## 2018-12-22 MED ORDER — OXYCODONE 10 MG TABLET
ORAL_TABLET | ORAL | 0 refills | 30 days | Status: CP | PRN
Start: 2018-12-22 — End: 2019-01-21

## 2018-12-23 ENCOUNTER — Other Ambulatory Visit: Admit: 2018-12-23 | Discharge: 2018-12-23 | Payer: MEDICARE

## 2018-12-23 ENCOUNTER — Ambulatory Visit
Admit: 2018-12-23 | Discharge: 2018-12-23 | Payer: MEDICARE | Attending: Hematology & Oncology | Primary: Hematology & Oncology

## 2018-12-23 ENCOUNTER — Ambulatory Visit: Admit: 2018-12-23 | Discharge: 2018-12-24

## 2018-12-23 ENCOUNTER — Ambulatory Visit: Admit: 2018-12-23 | Discharge: 2018-12-23 | Payer: MEDICARE

## 2018-12-23 DIAGNOSIS — C3402 Malignant neoplasm of left main bronchus: Principal | ICD-10-CM

## 2018-12-23 DIAGNOSIS — C329 Malignant neoplasm of larynx, unspecified: Principal | ICD-10-CM

## 2018-12-23 DIAGNOSIS — Z8619 Personal history of other infectious and parasitic diseases: Secondary | ICD-10-CM | POA: Diagnosis not present

## 2018-12-23 DIAGNOSIS — N179 Acute kidney failure, unspecified: Secondary | ICD-10-CM | POA: Diagnosis not present

## 2018-12-23 DIAGNOSIS — I10 Essential (primary) hypertension: Secondary | ICD-10-CM | POA: Diagnosis not present

## 2018-12-23 DIAGNOSIS — Z8521 Personal history of malignant neoplasm of larynx: Secondary | ICD-10-CM | POA: Diagnosis not present

## 2018-12-23 DIAGNOSIS — E860 Dehydration: Secondary | ICD-10-CM | POA: Diagnosis not present

## 2018-12-23 DIAGNOSIS — C3432 Malignant neoplasm of lower lobe, left bronchus or lung: Secondary | ICD-10-CM | POA: Diagnosis not present

## 2018-12-23 DIAGNOSIS — Z510 Encounter for antineoplastic radiation therapy: Secondary | ICD-10-CM | POA: Diagnosis not present

## 2018-12-23 DIAGNOSIS — F17210 Nicotine dependence, cigarettes, uncomplicated: Secondary | ICD-10-CM | POA: Diagnosis not present

## 2018-12-23 DIAGNOSIS — C7951 Secondary malignant neoplasm of bone: Secondary | ICD-10-CM | POA: Diagnosis not present

## 2018-12-23 DIAGNOSIS — R252 Cramp and spasm: Secondary | ICD-10-CM | POA: Diagnosis not present

## 2018-12-24 ENCOUNTER — Ambulatory Visit: Admit: 2018-12-24 | Discharge: 2018-12-25

## 2018-12-24 DIAGNOSIS — C3432 Malignant neoplasm of lower lobe, left bronchus or lung: Secondary | ICD-10-CM | POA: Diagnosis not present

## 2018-12-24 DIAGNOSIS — C7951 Secondary malignant neoplasm of bone: Secondary | ICD-10-CM | POA: Diagnosis not present

## 2018-12-24 DIAGNOSIS — Z510 Encounter for antineoplastic radiation therapy: Secondary | ICD-10-CM | POA: Diagnosis not present

## 2018-12-25 ENCOUNTER — Ambulatory Visit: Admit: 2018-12-25 | Discharge: 2018-12-26

## 2018-12-25 DIAGNOSIS — Z510 Encounter for antineoplastic radiation therapy: Secondary | ICD-10-CM | POA: Diagnosis not present

## 2018-12-25 DIAGNOSIS — C3432 Malignant neoplasm of lower lobe, left bronchus or lung: Secondary | ICD-10-CM | POA: Diagnosis not present

## 2018-12-28 ENCOUNTER — Telehealth: Admit: 2018-12-28 | Discharge: 2018-12-29 | Payer: MEDICARE

## 2018-12-28 ENCOUNTER — Ambulatory Visit: Admit: 2018-12-28 | Discharge: 2018-12-29

## 2018-12-28 DIAGNOSIS — Z510 Encounter for antineoplastic radiation therapy: Secondary | ICD-10-CM | POA: Diagnosis not present

## 2018-12-28 DIAGNOSIS — G893 Neoplasm related pain (acute) (chronic): Secondary | ICD-10-CM | POA: Diagnosis not present

## 2018-12-28 DIAGNOSIS — C7951 Secondary malignant neoplasm of bone: Secondary | ICD-10-CM | POA: Diagnosis not present

## 2018-12-28 DIAGNOSIS — C3432 Malignant neoplasm of lower lobe, left bronchus or lung: Secondary | ICD-10-CM | POA: Diagnosis not present

## 2018-12-29 ENCOUNTER — Ambulatory Visit: Admit: 2018-12-29 | Discharge: 2018-12-30

## 2018-12-29 DIAGNOSIS — Z510 Encounter for antineoplastic radiation therapy: Secondary | ICD-10-CM | POA: Diagnosis not present

## 2018-12-29 DIAGNOSIS — C3432 Malignant neoplasm of lower lobe, left bronchus or lung: Secondary | ICD-10-CM | POA: Diagnosis not present

## 2018-12-30 ENCOUNTER — Other Ambulatory Visit: Admit: 2018-12-30 | Discharge: 2018-12-31 | Payer: MEDICARE

## 2018-12-30 ENCOUNTER — Ambulatory Visit: Admit: 2018-12-30 | Discharge: 2018-12-31 | Payer: MEDICARE

## 2018-12-30 ENCOUNTER — Ambulatory Visit: Admit: 2018-12-30 | Discharge: 2018-12-31

## 2018-12-30 ENCOUNTER — Ambulatory Visit: Admit: 2018-12-30 | Discharge: 2018-12-31 | Payer: MEDICARE | Attending: Adult Health | Primary: Adult Health

## 2018-12-30 DIAGNOSIS — C3402 Malignant neoplasm of left main bronchus: Principal | ICD-10-CM

## 2018-12-30 DIAGNOSIS — C329 Malignant neoplasm of larynx, unspecified: Principal | ICD-10-CM

## 2018-12-30 DIAGNOSIS — R252 Cramp and spasm: Secondary | ICD-10-CM | POA: Diagnosis not present

## 2018-12-30 DIAGNOSIS — C7951 Secondary malignant neoplasm of bone: Secondary | ICD-10-CM | POA: Diagnosis not present

## 2018-12-30 DIAGNOSIS — Z510 Encounter for antineoplastic radiation therapy: Secondary | ICD-10-CM | POA: Diagnosis not present

## 2018-12-30 DIAGNOSIS — Z5111 Encounter for antineoplastic chemotherapy: Secondary | ICD-10-CM | POA: Diagnosis not present

## 2018-12-30 DIAGNOSIS — N179 Acute kidney failure, unspecified: Secondary | ICD-10-CM | POA: Diagnosis not present

## 2018-12-30 DIAGNOSIS — C3432 Malignant neoplasm of lower lobe, left bronchus or lung: Secondary | ICD-10-CM | POA: Diagnosis not present

## 2018-12-31 ENCOUNTER — Ambulatory Visit: Admit: 2018-12-31 | Discharge: 2019-01-01

## 2018-12-31 DIAGNOSIS — Z510 Encounter for antineoplastic radiation therapy: Secondary | ICD-10-CM | POA: Diagnosis not present

## 2018-12-31 DIAGNOSIS — C3432 Malignant neoplasm of lower lobe, left bronchus or lung: Secondary | ICD-10-CM | POA: Diagnosis not present

## 2018-12-31 DIAGNOSIS — C7951 Secondary malignant neoplasm of bone: Secondary | ICD-10-CM | POA: Diagnosis not present

## 2019-01-01 ENCOUNTER — Ambulatory Visit: Admit: 2019-01-01 | Discharge: 2019-01-02

## 2019-01-01 DIAGNOSIS — C7951 Secondary malignant neoplasm of bone: Secondary | ICD-10-CM | POA: Diagnosis not present

## 2019-01-01 DIAGNOSIS — Z510 Encounter for antineoplastic radiation therapy: Secondary | ICD-10-CM | POA: Diagnosis not present

## 2019-01-01 DIAGNOSIS — C3432 Malignant neoplasm of lower lobe, left bronchus or lung: Secondary | ICD-10-CM | POA: Diagnosis not present

## 2019-01-04 ENCOUNTER — Ambulatory Visit: Admit: 2019-01-04 | Discharge: 2019-01-05

## 2019-01-04 DIAGNOSIS — Z510 Encounter for antineoplastic radiation therapy: Secondary | ICD-10-CM | POA: Diagnosis not present

## 2019-01-04 DIAGNOSIS — C7951 Secondary malignant neoplasm of bone: Secondary | ICD-10-CM | POA: Diagnosis not present

## 2019-01-04 DIAGNOSIS — C3432 Malignant neoplasm of lower lobe, left bronchus or lung: Secondary | ICD-10-CM | POA: Diagnosis not present

## 2019-01-05 ENCOUNTER — Ambulatory Visit: Admit: 2019-01-05 | Discharge: 2019-01-06

## 2019-01-05 DIAGNOSIS — C7951 Secondary malignant neoplasm of bone: Secondary | ICD-10-CM | POA: Diagnosis not present

## 2019-01-05 DIAGNOSIS — Z510 Encounter for antineoplastic radiation therapy: Secondary | ICD-10-CM | POA: Diagnosis not present

## 2019-01-05 DIAGNOSIS — C3432 Malignant neoplasm of lower lobe, left bronchus or lung: Secondary | ICD-10-CM | POA: Diagnosis not present

## 2019-01-06 ENCOUNTER — Ambulatory Visit: Admit: 2019-01-06 | Discharge: 2019-01-07

## 2019-01-06 ENCOUNTER — Other Ambulatory Visit: Admit: 2019-01-06 | Discharge: 2019-01-07 | Payer: MEDICARE

## 2019-01-06 ENCOUNTER — Ambulatory Visit: Admit: 2019-01-06 | Discharge: 2019-01-07 | Payer: MEDICARE

## 2019-01-06 ENCOUNTER — Telehealth: Admit: 2019-01-06 | Discharge: 2019-01-07 | Payer: MEDICARE | Attending: Adult Health | Primary: Adult Health

## 2019-01-06 DIAGNOSIS — C329 Malignant neoplasm of larynx, unspecified: Principal | ICD-10-CM

## 2019-01-06 DIAGNOSIS — C3402 Malignant neoplasm of left main bronchus: Principal | ICD-10-CM

## 2019-01-06 DIAGNOSIS — R112 Nausea with vomiting, unspecified: Secondary | ICD-10-CM | POA: Diagnosis not present

## 2019-01-06 DIAGNOSIS — R252 Cramp and spasm: Secondary | ICD-10-CM | POA: Diagnosis not present

## 2019-01-06 DIAGNOSIS — N179 Acute kidney failure, unspecified: Secondary | ICD-10-CM | POA: Diagnosis not present

## 2019-01-06 DIAGNOSIS — Z5111 Encounter for antineoplastic chemotherapy: Secondary | ICD-10-CM | POA: Diagnosis not present

## 2019-01-06 DIAGNOSIS — M4802 Spinal stenosis, cervical region: Secondary | ICD-10-CM | POA: Diagnosis not present

## 2019-01-06 DIAGNOSIS — Z510 Encounter for antineoplastic radiation therapy: Secondary | ICD-10-CM | POA: Diagnosis not present

## 2019-01-06 DIAGNOSIS — C3432 Malignant neoplasm of lower lobe, left bronchus or lung: Secondary | ICD-10-CM | POA: Diagnosis not present

## 2019-01-06 DIAGNOSIS — R109 Unspecified abdominal pain: Secondary | ICD-10-CM | POA: Diagnosis not present

## 2019-01-06 DIAGNOSIS — G629 Polyneuropathy, unspecified: Secondary | ICD-10-CM | POA: Diagnosis not present

## 2019-01-06 DIAGNOSIS — C7951 Secondary malignant neoplasm of bone: Secondary | ICD-10-CM | POA: Diagnosis not present

## 2019-01-06 DIAGNOSIS — I10 Essential (primary) hypertension: Secondary | ICD-10-CM | POA: Diagnosis not present

## 2019-01-11 DIAGNOSIS — C3402 Malignant neoplasm of left main bronchus: Principal | ICD-10-CM

## 2019-01-14 ENCOUNTER — Other Ambulatory Visit: Admit: 2019-01-14 | Discharge: 2019-01-15 | Payer: MEDICARE

## 2019-01-14 DIAGNOSIS — C3402 Malignant neoplasm of left main bronchus: Principal | ICD-10-CM

## 2019-01-16 DIAGNOSIS — C329 Malignant neoplasm of larynx, unspecified: Principal | ICD-10-CM

## 2019-01-18 MED ORDER — ESCITALOPRAM 20 MG TABLET
ORAL_TABLET | Freq: Every day | ORAL | 1 refills | 90 days | Status: CP
Start: 2019-01-18 — End: 2020-01-18

## 2019-01-20 ENCOUNTER — Other Ambulatory Visit: Admit: 2019-01-20 | Discharge: 2019-01-21 | Payer: MEDICARE

## 2019-01-20 ENCOUNTER — Ambulatory Visit
Admit: 2019-01-20 | Discharge: 2019-01-21 | Payer: MEDICARE | Attending: Hematology & Oncology | Primary: Hematology & Oncology

## 2019-01-20 ENCOUNTER — Ambulatory Visit: Admit: 2019-01-20 | Discharge: 2019-01-21 | Payer: MEDICARE

## 2019-01-20 DIAGNOSIS — Z859 Personal history of malignant neoplasm, unspecified: Principal | ICD-10-CM

## 2019-01-20 DIAGNOSIS — R221 Localized swelling, mass and lump, neck: Principal | ICD-10-CM

## 2019-01-20 DIAGNOSIS — C3402 Malignant neoplasm of left main bronchus: Principal | ICD-10-CM

## 2019-01-20 DIAGNOSIS — C7951 Secondary malignant neoplasm of bone: Principal | ICD-10-CM

## 2019-01-20 DIAGNOSIS — R918 Other nonspecific abnormal finding of lung field: Secondary | ICD-10-CM | POA: Diagnosis not present

## 2019-01-20 DIAGNOSIS — J90 Pleural effusion, not elsewhere classified: Secondary | ICD-10-CM | POA: Diagnosis not present

## 2019-01-20 DIAGNOSIS — C3490 Malignant neoplasm of unspecified part of unspecified bronchus or lung: Secondary | ICD-10-CM | POA: Diagnosis not present

## 2019-01-20 DIAGNOSIS — C3432 Malignant neoplasm of lower lobe, left bronchus or lung: Secondary | ICD-10-CM | POA: Diagnosis not present

## 2019-01-20 DIAGNOSIS — G893 Neoplasm related pain (acute) (chronic): Secondary | ICD-10-CM | POA: Diagnosis not present

## 2019-01-20 MED ORDER — OXYCODONE 10 MG TABLET
ORAL_TABLET | ORAL | 0 refills | 15 days | Status: CP | PRN
Start: 2019-01-20 — End: 2019-02-19

## 2019-01-20 MED ORDER — MORPHINE ER 15 MG TABLET,EXTENDED RELEASE
ORAL_TABLET | Freq: Two times a day (BID) | ORAL | 0 refills | 30 days | Status: CP
Start: 2019-01-20 — End: 2019-02-03

## 2019-02-03 ENCOUNTER — Ambulatory Visit: Admit: 2019-02-03 | Discharge: 2019-02-03 | Payer: MEDICARE

## 2019-02-03 ENCOUNTER — Ambulatory Visit
Admit: 2019-02-03 | Discharge: 2019-02-03 | Payer: MEDICARE | Attending: Hematology & Oncology | Primary: Hematology & Oncology

## 2019-02-03 ENCOUNTER — Other Ambulatory Visit: Admit: 2019-02-03 | Discharge: 2019-02-03 | Payer: MEDICARE

## 2019-02-03 DIAGNOSIS — C3402 Malignant neoplasm of left main bronchus: Principal | ICD-10-CM

## 2019-02-03 DIAGNOSIS — C329 Malignant neoplasm of larynx, unspecified: Principal | ICD-10-CM

## 2019-02-03 DIAGNOSIS — I10 Essential (primary) hypertension: Principal | ICD-10-CM

## 2019-02-03 DIAGNOSIS — C7951 Secondary malignant neoplasm of bone: Secondary | ICD-10-CM | POA: Diagnosis not present

## 2019-02-03 DIAGNOSIS — C3432 Malignant neoplasm of lower lobe, left bronchus or lung: Secondary | ICD-10-CM | POA: Diagnosis not present

## 2019-02-03 DIAGNOSIS — Z8521 Personal history of malignant neoplasm of larynx: Secondary | ICD-10-CM | POA: Diagnosis not present

## 2019-02-03 DIAGNOSIS — R221 Localized swelling, mass and lump, neck: Secondary | ICD-10-CM | POA: Diagnosis not present

## 2019-02-03 DIAGNOSIS — F1190 Opioid use, unspecified, uncomplicated: Secondary | ICD-10-CM | POA: Diagnosis not present

## 2019-02-03 DIAGNOSIS — Z859 Personal history of malignant neoplasm, unspecified: Secondary | ICD-10-CM | POA: Diagnosis not present

## 2019-02-03 DIAGNOSIS — M4802 Spinal stenosis, cervical region: Secondary | ICD-10-CM | POA: Diagnosis not present

## 2019-02-03 DIAGNOSIS — Z5112 Encounter for antineoplastic immunotherapy: Secondary | ICD-10-CM | POA: Diagnosis not present

## 2019-02-03 DIAGNOSIS — R109 Unspecified abdominal pain: Secondary | ICD-10-CM | POA: Diagnosis not present

## 2019-02-03 DIAGNOSIS — M545 Low back pain: Secondary | ICD-10-CM | POA: Diagnosis not present

## 2019-02-03 MED ORDER — OXYCODONE 10 MG TABLET
ORAL_TABLET | ORAL | 0 refills | 7.00000 days | Status: CP | PRN
Start: 2019-02-03 — End: 2019-02-10

## 2019-02-03 MED ORDER — OXYCODONE 10 MG TABLET: 10 mg | tablet | 0 refills | 30 days | Status: AC

## 2019-02-03 MED ORDER — LISINOPRIL 10 MG TABLET
ORAL_TABLET | Freq: Every day | ORAL | 1 refills | 90.00000 days | Status: CP
Start: 2019-02-03 — End: 2020-02-03

## 2019-02-03 MED ORDER — MORPHINE ER 15 MG TABLET,EXTENDED RELEASE
ORAL_TABLET | Freq: Two times a day (BID) | ORAL | 0 refills | 30.00000 days | Status: CP
Start: 2019-02-03 — End: 2019-03-05

## 2019-02-04 MED ORDER — NALOXONE 4 MG/ACTUATION NASAL SPRAY
0 refills | 0 days | Status: CP
Start: 2019-02-04 — End: ?

## 2019-02-17 ENCOUNTER — Other Ambulatory Visit: Admit: 2019-02-17 | Discharge: 2019-02-18 | Payer: MEDICARE

## 2019-02-17 ENCOUNTER — Telehealth: Admit: 2019-02-17 | Discharge: 2019-02-18 | Payer: MEDICARE | Attending: Adult Health | Primary: Adult Health

## 2019-02-17 ENCOUNTER — Ambulatory Visit: Admit: 2019-02-17 | Discharge: 2019-02-18 | Payer: MEDICARE

## 2019-02-17 DIAGNOSIS — C3402 Malignant neoplasm of left main bronchus: Principal | ICD-10-CM

## 2019-02-17 DIAGNOSIS — C329 Malignant neoplasm of larynx, unspecified: Principal | ICD-10-CM

## 2019-02-17 DIAGNOSIS — Z5112 Encounter for antineoplastic immunotherapy: Secondary | ICD-10-CM | POA: Diagnosis not present

## 2019-02-17 DIAGNOSIS — C3432 Malignant neoplasm of lower lobe, left bronchus or lung: Secondary | ICD-10-CM | POA: Diagnosis not present

## 2019-02-17 DIAGNOSIS — C7951 Secondary malignant neoplasm of bone: Secondary | ICD-10-CM | POA: Diagnosis not present

## 2019-03-03 ENCOUNTER — Ambulatory Visit: Admit: 2019-03-03 | Discharge: 2019-03-04 | Payer: MEDICARE

## 2019-03-03 ENCOUNTER — Other Ambulatory Visit: Admit: 2019-03-03 | Discharge: 2019-03-04 | Payer: MEDICARE

## 2019-03-03 DIAGNOSIS — C329 Malignant neoplasm of larynx, unspecified: Principal | ICD-10-CM

## 2019-03-03 DIAGNOSIS — C3402 Malignant neoplasm of left main bronchus: Principal | ICD-10-CM

## 2019-03-03 DIAGNOSIS — Z5112 Encounter for antineoplastic immunotherapy: Secondary | ICD-10-CM | POA: Diagnosis not present

## 2019-03-05 DIAGNOSIS — C3402 Malignant neoplasm of left main bronchus: Principal | ICD-10-CM

## 2019-03-05 DIAGNOSIS — M62830 Muscle spasm of back: Principal | ICD-10-CM

## 2019-03-05 DIAGNOSIS — C329 Malignant neoplasm of larynx, unspecified: Principal | ICD-10-CM

## 2019-03-05 DIAGNOSIS — Z859 Personal history of malignant neoplasm, unspecified: Principal | ICD-10-CM

## 2019-03-05 DIAGNOSIS — R221 Localized swelling, mass and lump, neck: Principal | ICD-10-CM

## 2019-03-05 DIAGNOSIS — I10 Essential (primary) hypertension: Principal | ICD-10-CM

## 2019-03-05 DIAGNOSIS — L299 Pruritus, unspecified: Principal | ICD-10-CM

## 2019-03-05 DIAGNOSIS — C7951 Secondary malignant neoplasm of bone: Principal | ICD-10-CM

## 2019-03-05 MED ORDER — PROCHLORPERAZINE MALEATE 10 MG TABLET
ORAL_TABLET | Freq: Four times a day (QID) | ORAL | 2 refills | 8.00000 days | Status: CP | PRN
Start: 2019-03-05 — End: 2020-03-04

## 2019-03-05 MED ORDER — HYDROXYZINE HCL 25 MG TABLET
ORAL_TABLET | Freq: Three times a day (TID) | ORAL | 0 refills | 60.00000 days | Status: CP | PRN
Start: 2019-03-05 — End: ?

## 2019-03-17 ENCOUNTER — Other Ambulatory Visit: Admit: 2019-03-17 | Discharge: 2019-03-17 | Payer: MEDICARE

## 2019-03-17 ENCOUNTER — Ambulatory Visit: Admit: 2019-03-17 | Discharge: 2019-03-17 | Payer: MEDICARE

## 2019-03-17 DIAGNOSIS — C329 Malignant neoplasm of larynx, unspecified: Principal | ICD-10-CM

## 2019-03-17 DIAGNOSIS — C3402 Malignant neoplasm of left main bronchus: Principal | ICD-10-CM

## 2019-03-17 DIAGNOSIS — R221 Localized swelling, mass and lump, neck: Secondary | ICD-10-CM | POA: Diagnosis not present

## 2019-03-17 DIAGNOSIS — Z859 Personal history of malignant neoplasm, unspecified: Secondary | ICD-10-CM | POA: Diagnosis not present

## 2019-03-17 DIAGNOSIS — C7951 Secondary malignant neoplasm of bone: Secondary | ICD-10-CM | POA: Diagnosis not present

## 2019-03-17 DIAGNOSIS — Z515 Encounter for palliative care: Secondary | ICD-10-CM | POA: Diagnosis not present

## 2019-03-17 DIAGNOSIS — Z5112 Encounter for antineoplastic immunotherapy: Secondary | ICD-10-CM | POA: Diagnosis not present

## 2019-03-17 MED ORDER — DICLOFENAC 1 % TOPICAL GEL
Freq: Four times a day (QID) | TOPICAL | 0 refills | 13.00000 days | Status: CP | PRN
Start: 2019-03-17 — End: ?

## 2019-03-17 MED ORDER — OXYCODONE 10 MG TABLET
ORAL_TABLET | ORAL | 0 refills | 30.00000 days | Status: CP | PRN
Start: 2019-03-17 — End: 2019-04-16

## 2019-03-17 MED ORDER — MORPHINE ER 15 MG TABLET,EXTENDED RELEASE
ORAL_TABLET | Freq: Two times a day (BID) | ORAL | 0 refills | 30.00000 days | Status: CP
Start: 2019-03-17 — End: 2019-04-16

## 2019-03-22 MED ORDER — MORPHINE ER 15 MG TABLET,EXTENDED RELEASE
ORAL_TABLET | Freq: Two times a day (BID) | ORAL | 0 refills | 30.00000 days | Status: CP
Start: 2019-03-22 — End: 2019-04-21

## 2019-03-25 MED ORDER — TAMSULOSIN 0.4 MG CAPSULE
ORAL_CAPSULE | Freq: Every evening | ORAL | 1 refills | 90.00000 days | Status: CP
Start: 2019-03-25 — End: 2020-03-24

## 2019-03-25 MED ORDER — GABAPENTIN 300 MG CAPSULE
ORAL_CAPSULE | Freq: Every evening | ORAL | 1 refills | 90 days | Status: CP
Start: 2019-03-25 — End: 2020-03-24

## 2019-03-31 ENCOUNTER — Other Ambulatory Visit: Admit: 2019-03-31 | Discharge: 2019-04-01 | Payer: MEDICARE

## 2019-03-31 ENCOUNTER — Ambulatory Visit: Admit: 2019-03-31 | Discharge: 2019-04-01 | Payer: MEDICARE

## 2019-03-31 DIAGNOSIS — C3402 Malignant neoplasm of left main bronchus: Principal | ICD-10-CM

## 2019-03-31 DIAGNOSIS — C329 Malignant neoplasm of larynx, unspecified: Principal | ICD-10-CM

## 2019-03-31 DIAGNOSIS — Z5111 Encounter for antineoplastic chemotherapy: Secondary | ICD-10-CM | POA: Diagnosis not present

## 2019-04-14 ENCOUNTER — Other Ambulatory Visit: Admit: 2019-04-14 | Discharge: 2019-04-15 | Payer: MEDICARE

## 2019-04-14 ENCOUNTER — Ambulatory Visit: Admit: 2019-04-14 | Discharge: 2019-04-15 | Payer: MEDICARE

## 2019-04-14 DIAGNOSIS — C3402 Malignant neoplasm of left main bronchus: Principal | ICD-10-CM

## 2019-04-14 DIAGNOSIS — C329 Malignant neoplasm of larynx, unspecified: Principal | ICD-10-CM

## 2019-04-14 DIAGNOSIS — Z5111 Encounter for antineoplastic chemotherapy: Secondary | ICD-10-CM | POA: Diagnosis not present

## 2019-04-28 ENCOUNTER — Ambulatory Visit: Admit: 2019-04-28 | Discharge: 2019-04-29 | Payer: MEDICARE

## 2019-04-28 ENCOUNTER — Other Ambulatory Visit: Admit: 2019-04-28 | Discharge: 2019-04-29 | Payer: MEDICARE

## 2019-04-28 ENCOUNTER — Ambulatory Visit
Admit: 2019-04-28 | Discharge: 2019-04-29 | Payer: MEDICARE | Attending: Hematology & Oncology | Primary: Hematology & Oncology

## 2019-04-28 DIAGNOSIS — C3402 Malignant neoplasm of left main bronchus: Principal | ICD-10-CM

## 2019-04-28 DIAGNOSIS — C329 Malignant neoplasm of larynx, unspecified: Principal | ICD-10-CM

## 2019-04-28 DIAGNOSIS — C3432 Malignant neoplasm of lower lobe, left bronchus or lung: Secondary | ICD-10-CM | POA: Diagnosis not present

## 2019-04-28 DIAGNOSIS — G893 Neoplasm related pain (acute) (chronic): Secondary | ICD-10-CM | POA: Diagnosis not present

## 2019-04-28 DIAGNOSIS — R221 Localized swelling, mass and lump, neck: Secondary | ICD-10-CM | POA: Diagnosis not present

## 2019-04-28 DIAGNOSIS — Z79899 Other long term (current) drug therapy: Secondary | ICD-10-CM | POA: Diagnosis not present

## 2019-04-28 DIAGNOSIS — I10 Essential (primary) hypertension: Secondary | ICD-10-CM | POA: Diagnosis not present

## 2019-04-28 DIAGNOSIS — Z8589 Personal history of malignant neoplasm of other organs and systems: Secondary | ICD-10-CM | POA: Diagnosis not present

## 2019-04-28 DIAGNOSIS — Z85118 Personal history of other malignant neoplasm of bronchus and lung: Secondary | ICD-10-CM | POA: Diagnosis not present

## 2019-04-28 DIAGNOSIS — Z5111 Encounter for antineoplastic chemotherapy: Secondary | ICD-10-CM | POA: Diagnosis not present

## 2019-04-28 DIAGNOSIS — Z859 Personal history of malignant neoplasm, unspecified: Secondary | ICD-10-CM | POA: Diagnosis not present

## 2019-04-28 DIAGNOSIS — C7951 Secondary malignant neoplasm of bone: Secondary | ICD-10-CM | POA: Diagnosis not present

## 2019-04-28 MED ORDER — OXYCODONE 10 MG TABLET
ORAL_TABLET | ORAL | 0 refills | 30.00000 days | Status: CP | PRN
Start: 2019-04-28 — End: 2019-05-28

## 2019-04-28 MED ORDER — MORPHINE ER 30 MG TABLET,EXTENDED RELEASE
ORAL_TABLET | Freq: Two times a day (BID) | ORAL | 0 refills | 30.00000 days | Status: CP
Start: 2019-04-28 — End: 2019-05-28

## 2019-04-28 MED ORDER — OXYCODONE 10 MG TABLET: 10 mg | tablet | 0 refills | 30 days | Status: AC

## 2019-04-28 MED ORDER — MORPHINE ER 30 MG TABLET,EXTENDED RELEASE: 30 mg | tablet | Freq: Two times a day (BID) | 0 refills | 30 days | Status: AC

## 2019-05-01 DIAGNOSIS — M62830 Muscle spasm of back: Principal | ICD-10-CM

## 2019-05-02 MED ORDER — PROCHLORPERAZINE MALEATE 10 MG TABLET
ORAL_TABLET | Freq: Four times a day (QID) | ORAL | 0 refills | 8.00000 days | Status: CP | PRN
Start: 2019-05-02 — End: 2020-05-01

## 2019-05-10 ENCOUNTER — Ambulatory Visit: Admit: 2019-05-10 | Discharge: 2019-05-11 | Payer: MEDICARE

## 2019-05-10 DIAGNOSIS — Z930 Tracheostomy status: Principal | ICD-10-CM

## 2019-05-10 DIAGNOSIS — C3490 Malignant neoplasm of unspecified part of unspecified bronchus or lung: Principal | ICD-10-CM

## 2019-05-10 DIAGNOSIS — C329 Malignant neoplasm of larynx, unspecified: Principal | ICD-10-CM

## 2019-05-10 DIAGNOSIS — C3432 Malignant neoplasm of lower lobe, left bronchus or lung: Secondary | ICD-10-CM | POA: Diagnosis not present

## 2019-05-10 DIAGNOSIS — C7951 Secondary malignant neoplasm of bone: Secondary | ICD-10-CM | POA: Diagnosis not present

## 2019-05-10 DIAGNOSIS — Z08 Encounter for follow-up examination after completed treatment for malignant neoplasm: Secondary | ICD-10-CM | POA: Diagnosis not present

## 2019-05-10 DIAGNOSIS — Z923 Personal history of irradiation: Secondary | ICD-10-CM | POA: Diagnosis not present

## 2019-05-10 DIAGNOSIS — Z8521 Personal history of malignant neoplasm of larynx: Secondary | ICD-10-CM | POA: Diagnosis not present

## 2019-05-10 DIAGNOSIS — R221 Localized swelling, mass and lump, neck: Secondary | ICD-10-CM | POA: Diagnosis not present

## 2019-05-10 DIAGNOSIS — Z9221 Personal history of antineoplastic chemotherapy: Secondary | ICD-10-CM | POA: Diagnosis not present

## 2019-05-12 ENCOUNTER — Ambulatory Visit: Admit: 2019-05-12 | Discharge: 2019-05-12 | Payer: MEDICARE | Attending: Family | Primary: Family

## 2019-05-12 ENCOUNTER — Ambulatory Visit: Admit: 2019-05-12 | Discharge: 2019-05-12 | Payer: MEDICARE

## 2019-05-12 ENCOUNTER — Other Ambulatory Visit: Admit: 2019-05-12 | Discharge: 2019-05-12 | Payer: MEDICARE

## 2019-05-12 DIAGNOSIS — C3402 Malignant neoplasm of left main bronchus: Principal | ICD-10-CM

## 2019-05-12 DIAGNOSIS — Z79899 Other long term (current) drug therapy: Principal | ICD-10-CM

## 2019-05-12 DIAGNOSIS — C329 Malignant neoplasm of larynx, unspecified: Principal | ICD-10-CM

## 2019-05-12 DIAGNOSIS — Z5112 Encounter for antineoplastic immunotherapy: Secondary | ICD-10-CM | POA: Diagnosis not present

## 2019-05-13 DIAGNOSIS — R7989 Other specified abnormal findings of blood chemistry: Principal | ICD-10-CM

## 2019-05-13 MED ORDER — LEVOTHYROXINE 50 MCG TABLET
ORAL_TABLET | Freq: Every day | ORAL | 11 refills | 30 days | Status: CP
Start: 2019-05-13 — End: 2020-05-12

## 2019-05-20 DIAGNOSIS — M62830 Muscle spasm of back: Principal | ICD-10-CM

## 2019-05-20 MED ORDER — PROCHLORPERAZINE MALEATE 10 MG TABLET
ORAL_TABLET | 0 refills | 0 days | Status: CP
Start: 2019-05-20 — End: ?

## 2019-05-26 ENCOUNTER — Ambulatory Visit: Admit: 2019-05-26 | Discharge: 2019-05-26 | Payer: MEDICARE

## 2019-05-26 ENCOUNTER — Other Ambulatory Visit: Admit: 2019-05-26 | Discharge: 2019-05-26 | Payer: MEDICARE

## 2019-05-26 DIAGNOSIS — Z79899 Other long term (current) drug therapy: Principal | ICD-10-CM

## 2019-05-26 DIAGNOSIS — C329 Malignant neoplasm of larynx, unspecified: Principal | ICD-10-CM

## 2019-05-26 DIAGNOSIS — C3402 Malignant neoplasm of left main bronchus: Principal | ICD-10-CM

## 2019-05-26 DIAGNOSIS — Z5112 Encounter for antineoplastic immunotherapy: Secondary | ICD-10-CM | POA: Diagnosis not present

## 2019-06-08 DIAGNOSIS — M62830 Muscle spasm of back: Principal | ICD-10-CM

## 2019-06-08 MED ORDER — PROCHLORPERAZINE MALEATE 10 MG TABLET
ORAL_TABLET | Freq: Four times a day (QID) | ORAL | 0 refills | 8 days | PRN
Start: 2019-06-08 — End: 2020-06-07

## 2019-06-09 DIAGNOSIS — C329 Malignant neoplasm of larynx, unspecified: Principal | ICD-10-CM

## 2019-06-09 DIAGNOSIS — Z79899 Other long term (current) drug therapy: Principal | ICD-10-CM

## 2019-06-09 DIAGNOSIS — C3402 Malignant neoplasm of left main bronchus: Principal | ICD-10-CM

## 2019-06-09 DIAGNOSIS — G893 Neoplasm related pain (acute) (chronic): Secondary | ICD-10-CM | POA: Diagnosis not present

## 2019-06-09 DIAGNOSIS — C7951 Secondary malignant neoplasm of bone: Secondary | ICD-10-CM | POA: Diagnosis not present

## 2019-06-09 DIAGNOSIS — Z515 Encounter for palliative care: Secondary | ICD-10-CM | POA: Diagnosis not present

## 2019-06-09 MED ORDER — OXYCODONE 15 MG TABLET
ORAL_TABLET | ORAL | 0 refills | 30.00000 days | Status: CP | PRN
Start: 2019-06-09 — End: 2019-07-09

## 2019-06-09 MED ORDER — PROCHLORPERAZINE MALEATE 10 MG TABLET
ORAL_TABLET | Freq: Four times a day (QID) | ORAL | 0 refills | 8.00000 days | Status: CP | PRN
Start: 2019-06-09 — End: 2020-06-08

## 2019-06-10 ENCOUNTER — Ambulatory Visit: Admit: 2019-06-10 | Discharge: 2019-06-10 | Payer: MEDICARE

## 2019-06-10 ENCOUNTER — Ambulatory Visit
Admit: 2019-06-10 | Discharge: 2019-06-10 | Payer: MEDICARE | Attending: Hematology & Oncology | Primary: Hematology & Oncology

## 2019-06-10 ENCOUNTER — Other Ambulatory Visit: Admit: 2019-06-10 | Discharge: 2019-06-10 | Payer: MEDICARE

## 2019-06-18 DIAGNOSIS — G893 Neoplasm related pain (acute) (chronic): Principal | ICD-10-CM

## 2019-06-18 DIAGNOSIS — C7951 Secondary malignant neoplasm of bone: Principal | ICD-10-CM

## 2019-06-18 DIAGNOSIS — C329 Malignant neoplasm of larynx, unspecified: Principal | ICD-10-CM

## 2019-06-18 MED ORDER — OXYCODONE 15 MG TABLET
ORAL_TABLET | ORAL | 0 refills | 30.00000 days | Status: CP | PRN
Start: 2019-06-18 — End: 2019-07-18

## 2019-06-20 DIAGNOSIS — L299 Pruritus, unspecified: Principal | ICD-10-CM

## 2019-06-20 DIAGNOSIS — M62830 Muscle spasm of back: Principal | ICD-10-CM

## 2019-06-20 MED ORDER — HYDROXYZINE HCL 25 MG TABLET
ORAL_TABLET | 0 refills | 0 days
Start: 2019-06-20 — End: ?

## 2019-06-21 MED ORDER — HYDROXYZINE HCL 25 MG TABLET
ORAL_TABLET | Freq: Three times a day (TID) | ORAL | 1 refills | 60.00000 days | Status: CP | PRN
Start: 2019-06-21 — End: ?

## 2019-06-21 MED ORDER — PROCHLORPERAZINE MALEATE 10 MG TABLET
ORAL_TABLET | Freq: Four times a day (QID) | ORAL | 0 refills | 8.00000 days | Status: CP | PRN
Start: 2019-06-21 — End: 2020-06-20

## 2019-06-22 ENCOUNTER — Ambulatory Visit: Admit: 2019-06-22 | Discharge: 2019-06-22 | Payer: MEDICARE

## 2019-06-22 DIAGNOSIS — C329 Malignant neoplasm of larynx, unspecified: Principal | ICD-10-CM

## 2019-06-22 DIAGNOSIS — Z79899 Other long term (current) drug therapy: Principal | ICD-10-CM

## 2019-06-22 DIAGNOSIS — C3402 Malignant neoplasm of left main bronchus: Principal | ICD-10-CM

## 2019-06-22 DIAGNOSIS — Z5111 Encounter for antineoplastic chemotherapy: Secondary | ICD-10-CM | POA: Diagnosis not present

## 2019-07-02 ENCOUNTER — Institutional Professional Consult (permissible substitution): Admit: 2019-07-02 | Discharge: 2019-07-03 | Payer: MEDICARE

## 2019-07-02 DIAGNOSIS — Z8521 Personal history of malignant neoplasm of larynx: Secondary | ICD-10-CM | POA: Diagnosis not present

## 2019-07-02 DIAGNOSIS — C7951 Secondary malignant neoplasm of bone: Secondary | ICD-10-CM | POA: Diagnosis not present

## 2019-07-02 DIAGNOSIS — M542 Cervicalgia: Secondary | ICD-10-CM | POA: Diagnosis not present

## 2019-07-02 DIAGNOSIS — C3432 Malignant neoplasm of lower lobe, left bronchus or lung: Secondary | ICD-10-CM | POA: Diagnosis not present

## 2019-07-02 DIAGNOSIS — T85848A Pain due to other internal prosthetic devices, implants and grafts, initial encounter: Secondary | ICD-10-CM | POA: Diagnosis not present

## 2019-07-02 DIAGNOSIS — M479 Spondylosis, unspecified: Secondary | ICD-10-CM | POA: Diagnosis not present

## 2019-07-02 DIAGNOSIS — G893 Neoplasm related pain (acute) (chronic): Secondary | ICD-10-CM | POA: Diagnosis not present

## 2019-07-02 DIAGNOSIS — Y838 Other surgical procedures as the cause of abnormal reaction of the patient, or of later complication, without mention of misadventure at the time of the procedure: Secondary | ICD-10-CM | POA: Diagnosis not present

## 2019-07-10 MED ORDER — MORPHINE ER 30 MG TABLET,EXTENDED RELEASE
ORAL_TABLET | Freq: Two times a day (BID) | ORAL | 0 refills | 30.00000 days | Status: CP
Start: 2019-07-10 — End: 2020-07-09

## 2019-07-11 DIAGNOSIS — M62830 Muscle spasm of back: Principal | ICD-10-CM

## 2019-07-11 MED ORDER — PROCHLORPERAZINE MALEATE 10 MG TABLET
ORAL_TABLET | 0 refills | 0 days
Start: 2019-07-11 — End: ?

## 2019-07-12 MED ORDER — PROCHLORPERAZINE MALEATE 10 MG TABLET
ORAL_TABLET | Freq: Four times a day (QID) | ORAL | 0 refills | 8 days | PRN
Start: 2019-07-12 — End: 2020-07-11

## 2019-07-13 MED ORDER — PROCHLORPERAZINE MALEATE 10 MG TABLET
ORAL_TABLET | Freq: Four times a day (QID) | ORAL | 0 refills | 8 days | Status: CP | PRN
Start: 2019-07-13 — End: 2020-07-12

## 2019-07-18 DIAGNOSIS — C329 Malignant neoplasm of larynx, unspecified: Principal | ICD-10-CM

## 2019-07-18 DIAGNOSIS — G893 Neoplasm related pain (acute) (chronic): Principal | ICD-10-CM

## 2019-07-18 DIAGNOSIS — C7951 Secondary malignant neoplasm of bone: Principal | ICD-10-CM

## 2019-07-18 MED ORDER — OXYCODONE 15 MG TABLET
ORAL_TABLET | ORAL | 0 refills | 30.00000 days | Status: CP | PRN
Start: 2019-07-18 — End: 2019-08-17

## 2019-07-21 ENCOUNTER — Ambulatory Visit
Admit: 2019-07-21 | Discharge: 2019-07-21 | Payer: MEDICARE | Attending: Hematology & Oncology | Primary: Hematology & Oncology

## 2019-07-21 ENCOUNTER — Ambulatory Visit: Admit: 2019-07-21 | Discharge: 2019-07-21 | Payer: MEDICARE

## 2019-07-21 ENCOUNTER — Other Ambulatory Visit: Admit: 2019-07-21 | Discharge: 2019-07-21 | Payer: MEDICARE

## 2019-07-21 DIAGNOSIS — Z79899 Other long term (current) drug therapy: Principal | ICD-10-CM

## 2019-07-21 DIAGNOSIS — J449 Chronic obstructive pulmonary disease, unspecified: Principal | ICD-10-CM

## 2019-07-21 DIAGNOSIS — E871 Hypo-osmolality and hyponatremia: Principal | ICD-10-CM

## 2019-07-21 DIAGNOSIS — C3402 Malignant neoplasm of left main bronchus: Principal | ICD-10-CM

## 2019-07-21 DIAGNOSIS — C3490 Malignant neoplasm of unspecified part of unspecified bronchus or lung: Principal | ICD-10-CM

## 2019-07-21 DIAGNOSIS — C329 Malignant neoplasm of larynx, unspecified: Principal | ICD-10-CM

## 2019-07-21 DIAGNOSIS — C7951 Secondary malignant neoplasm of bone: Secondary | ICD-10-CM | POA: Diagnosis not present

## 2019-07-21 DIAGNOSIS — Z923 Personal history of irradiation: Secondary | ICD-10-CM | POA: Diagnosis not present

## 2019-07-21 DIAGNOSIS — Z5112 Encounter for antineoplastic immunotherapy: Secondary | ICD-10-CM | POA: Diagnosis not present

## 2019-07-21 DIAGNOSIS — C3432 Malignant neoplasm of lower lobe, left bronchus or lung: Secondary | ICD-10-CM | POA: Diagnosis not present

## 2019-07-21 DIAGNOSIS — R609 Edema, unspecified: Secondary | ICD-10-CM | POA: Diagnosis not present

## 2019-07-21 DIAGNOSIS — R918 Other nonspecific abnormal finding of lung field: Secondary | ICD-10-CM | POA: Diagnosis not present

## 2019-07-21 DIAGNOSIS — G893 Neoplasm related pain (acute) (chronic): Secondary | ICD-10-CM | POA: Diagnosis not present

## 2019-07-21 MED ORDER — OXYCODONE 15 MG TABLET
ORAL_TABLET | ORAL | 0 refills | 30.00000 days | Status: CP | PRN
Start: 2019-07-21 — End: 2019-08-20

## 2019-07-21 MED ORDER — MORPHINE ER 30 MG TABLET,EXTENDED RELEASE
ORAL_TABLET | Freq: Two times a day (BID) | ORAL | 0 refills | 30.00000 days | Status: CP
Start: 2019-07-21 — End: 2019-08-20

## 2019-07-23 DIAGNOSIS — C7951 Secondary malignant neoplasm of bone: Principal | ICD-10-CM

## 2019-07-23 DIAGNOSIS — C3402 Malignant neoplasm of left main bronchus: Principal | ICD-10-CM

## 2019-07-23 DIAGNOSIS — R5081 Fever presenting with conditions classified elsewhere: Principal | ICD-10-CM

## 2019-07-23 DIAGNOSIS — D709 Neutropenia, unspecified: Principal | ICD-10-CM

## 2019-07-23 DIAGNOSIS — C329 Malignant neoplasm of larynx, unspecified: Principal | ICD-10-CM

## 2019-07-23 DIAGNOSIS — Z79899 Other long term (current) drug therapy: Principal | ICD-10-CM

## 2019-07-23 DIAGNOSIS — E222 Syndrome of inappropriate secretion of antidiuretic hormone: Principal | ICD-10-CM

## 2019-07-23 DIAGNOSIS — N179 Acute kidney failure, unspecified: Principal | ICD-10-CM

## 2019-07-26 DIAGNOSIS — C329 Malignant neoplasm of larynx, unspecified: Principal | ICD-10-CM

## 2019-07-26 DIAGNOSIS — M62830 Muscle spasm of back: Principal | ICD-10-CM

## 2019-07-26 MED ORDER — PROCHLORPERAZINE MALEATE 10 MG TABLET
ORAL_TABLET | Freq: Four times a day (QID) | ORAL | 2 refills | 8 days | PRN
Start: 2019-07-26 — End: 2020-07-25

## 2019-07-27 MED ORDER — PROCHLORPERAZINE MALEATE 10 MG TABLET
ORAL_TABLET | Freq: Four times a day (QID) | ORAL | 2 refills | 8 days | Status: CP | PRN
Start: 2019-07-27 — End: 2020-07-26

## 2019-07-27 MED ORDER — ESCITALOPRAM 20 MG TABLET
ORAL_TABLET | Freq: Every day | ORAL | 1 refills | 90.00000 days | Status: CP
Start: 2019-07-27 — End: 2020-07-26

## 2019-07-28 ENCOUNTER — Other Ambulatory Visit: Admit: 2019-07-28 | Discharge: 2019-07-29 | Payer: MEDICARE

## 2019-07-28 DIAGNOSIS — E871 Hypo-osmolality and hyponatremia: Principal | ICD-10-CM

## 2019-07-28 DIAGNOSIS — C7951 Secondary malignant neoplasm of bone: Principal | ICD-10-CM

## 2019-07-28 DIAGNOSIS — E222 Syndrome of inappropriate secretion of antidiuretic hormone: Principal | ICD-10-CM

## 2019-07-28 DIAGNOSIS — C3402 Malignant neoplasm of left main bronchus: Principal | ICD-10-CM

## 2019-07-28 DIAGNOSIS — Z79899 Other long term (current) drug therapy: Principal | ICD-10-CM

## 2019-07-28 DIAGNOSIS — N179 Acute kidney failure, unspecified: Principal | ICD-10-CM

## 2019-07-28 DIAGNOSIS — C329 Malignant neoplasm of larynx, unspecified: Principal | ICD-10-CM

## 2019-08-18 ENCOUNTER — Ambulatory Visit: Admit: 2019-08-18 | Discharge: 2019-08-19 | Payer: MEDICARE

## 2019-08-18 DIAGNOSIS — C329 Malignant neoplasm of larynx, unspecified: Secondary | ICD-10-CM | POA: Diagnosis not present

## 2019-08-18 DIAGNOSIS — C3402 Malignant neoplasm of left main bronchus: Secondary | ICD-10-CM | POA: Diagnosis not present

## 2019-08-18 DIAGNOSIS — C7951 Secondary malignant neoplasm of bone: Secondary | ICD-10-CM | POA: Diagnosis not present

## 2019-08-18 DIAGNOSIS — G893 Neoplasm related pain (acute) (chronic): Secondary | ICD-10-CM | POA: Diagnosis not present

## 2019-08-18 MED ORDER — OXYCODONE 15 MG TABLET
ORAL_TABLET | ORAL | 0 refills | 30.00000 days | Status: CP | PRN
Start: 2019-08-18 — End: 2019-09-17

## 2019-08-18 MED ORDER — MORPHINE ER 15 MG TABLET,EXTENDED RELEASE
ORAL_TABLET | Freq: Every evening | ORAL | 0 refills | 30.00000 days | Status: CP
Start: 2019-08-18 — End: 2019-09-17

## 2019-08-25 ENCOUNTER — Ambulatory Visit: Admit: 2019-08-25 | Discharge: 2019-08-26 | Payer: MEDICARE

## 2019-08-25 ENCOUNTER — Other Ambulatory Visit: Admit: 2019-08-25 | Discharge: 2019-08-26 | Payer: MEDICARE

## 2019-08-25 DIAGNOSIS — C329 Malignant neoplasm of larynx, unspecified: Principal | ICD-10-CM

## 2019-08-25 DIAGNOSIS — G893 Neoplasm related pain (acute) (chronic): Principal | ICD-10-CM

## 2019-08-25 DIAGNOSIS — C3402 Malignant neoplasm of left main bronchus: Principal | ICD-10-CM

## 2019-08-25 DIAGNOSIS — C7951 Secondary malignant neoplasm of bone: Principal | ICD-10-CM

## 2019-08-25 DIAGNOSIS — Z79899 Other long term (current) drug therapy: Principal | ICD-10-CM

## 2019-08-25 DIAGNOSIS — Z5112 Encounter for antineoplastic immunotherapy: Secondary | ICD-10-CM | POA: Diagnosis not present

## 2019-08-25 MED ORDER — MORPHINE ER 15 MG TABLET,EXTENDED RELEASE
ORAL_TABLET | Freq: Every evening | ORAL | 0 refills | 30.00000 days | Status: CP
Start: 2019-08-25 — End: 2019-09-24

## 2019-08-25 MED ORDER — MORPHINE ER 30 MG TABLET,EXTENDED RELEASE
ORAL_TABLET | Freq: Two times a day (BID) | ORAL | 0 refills | 30.00000 days | Status: CP
Start: 2019-08-25 — End: 2019-09-24

## 2019-08-25 MED ORDER — OXYCODONE 15 MG TABLET
ORAL_TABLET | ORAL | 0 refills | 30.00000 days | Status: CP | PRN
Start: 2019-08-25 — End: 2019-09-24
  Filled 2019-08-25: qty 180, 30d supply, fill #0

## 2019-08-25 MED ORDER — OXYCODONE 15 MG TABLET: 15 mg | tablet | 0 refills | 30 days | Status: AC

## 2019-08-25 MED FILL — OXYCODONE 15 MG TABLET: 30 days supply | Qty: 180 | Fill #0 | Status: AC

## 2019-09-05 DIAGNOSIS — I10 Essential (primary) hypertension: Principal | ICD-10-CM

## 2019-09-05 MED ORDER — LISINOPRIL 10 MG TABLET
ORAL_TABLET | 0 refills | 0 days
Start: 2019-09-05 — End: ?

## 2019-09-06 DIAGNOSIS — I10 Essential (primary) hypertension: Principal | ICD-10-CM

## 2019-09-06 MED ORDER — LISINOPRIL 10 MG TABLET
ORAL_TABLET | Freq: Every day | ORAL | 1 refills | 90.00000 days | Status: CP
Start: 2019-09-06 — End: 2019-09-06

## 2019-09-06 MED ORDER — LISINOPRIL 10 MG TABLET: 10 mg | tablet | Freq: Every day | 1 refills | 90 days | Status: AC

## 2019-09-22 ENCOUNTER — Ambulatory Visit: Admit: 2019-09-22 | Discharge: 2019-09-22 | Payer: MEDICARE

## 2019-09-22 ENCOUNTER — Other Ambulatory Visit: Admit: 2019-09-22 | Discharge: 2019-09-22 | Payer: MEDICARE

## 2019-09-22 ENCOUNTER — Ambulatory Visit
Admit: 2019-09-22 | Discharge: 2019-09-22 | Payer: MEDICARE | Attending: Hematology & Oncology | Primary: Hematology & Oncology

## 2019-09-22 DIAGNOSIS — Z79899 Other long term (current) drug therapy: Principal | ICD-10-CM

## 2019-09-22 DIAGNOSIS — C3402 Malignant neoplasm of left main bronchus: Principal | ICD-10-CM

## 2019-09-22 DIAGNOSIS — M62830 Muscle spasm of back: Principal | ICD-10-CM

## 2019-09-22 DIAGNOSIS — C329 Malignant neoplasm of larynx, unspecified: Principal | ICD-10-CM

## 2019-09-22 DIAGNOSIS — J90 Pleural effusion, not elsewhere classified: Secondary | ICD-10-CM | POA: Diagnosis not present

## 2019-09-22 DIAGNOSIS — C7951 Secondary malignant neoplasm of bone: Secondary | ICD-10-CM | POA: Diagnosis not present

## 2019-09-22 DIAGNOSIS — M4802 Spinal stenosis, cervical region: Secondary | ICD-10-CM | POA: Diagnosis not present

## 2019-09-22 DIAGNOSIS — F17210 Nicotine dependence, cigarettes, uncomplicated: Secondary | ICD-10-CM | POA: Diagnosis not present

## 2019-09-22 DIAGNOSIS — R109 Unspecified abdominal pain: Secondary | ICD-10-CM | POA: Diagnosis not present

## 2019-09-22 DIAGNOSIS — G893 Neoplasm related pain (acute) (chronic): Secondary | ICD-10-CM | POA: Diagnosis not present

## 2019-09-22 DIAGNOSIS — R609 Edema, unspecified: Secondary | ICD-10-CM | POA: Diagnosis not present

## 2019-09-22 DIAGNOSIS — C3432 Malignant neoplasm of lower lobe, left bronchus or lung: Secondary | ICD-10-CM | POA: Diagnosis not present

## 2019-09-22 MED ORDER — MORPHINE ER 30 MG TABLET,EXTENDED RELEASE
ORAL_TABLET | ORAL | 0 refills | 0.00000 days | Status: CP
Start: 2019-09-22 — End: ?

## 2019-09-22 MED ORDER — PROCHLORPERAZINE MALEATE 10 MG TABLET
ORAL_TABLET | 0 refills | 0 days
Start: 2019-09-22 — End: ?

## 2019-09-23 MED ORDER — TAMSULOSIN 0.4 MG CAPSULE
ORAL_CAPSULE | Freq: Every evening | ORAL | 0 refills | 90 days | Status: CP
Start: 2019-09-23 — End: 2020-09-22

## 2019-09-23 MED ORDER — PROCHLORPERAZINE MALEATE 10 MG TABLET
ORAL_TABLET | Freq: Four times a day (QID) | ORAL | 2 refills | 8 days | Status: CP | PRN
Start: 2019-09-23 — End: 2020-09-22

## 2019-09-23 MED ORDER — GABAPENTIN 300 MG CAPSULE
ORAL_CAPSULE | Freq: Every evening | ORAL | 0 refills | 90.00000 days | Status: CP
Start: 2019-09-23 — End: 2020-09-22

## 2019-10-15 DIAGNOSIS — C7951 Secondary malignant neoplasm of bone: Principal | ICD-10-CM

## 2019-10-15 DIAGNOSIS — G893 Neoplasm related pain (acute) (chronic): Principal | ICD-10-CM

## 2019-10-15 DIAGNOSIS — C329 Malignant neoplasm of larynx, unspecified: Principal | ICD-10-CM

## 2019-10-15 MED ORDER — MORPHINE ER 30 MG TABLET,EXTENDED RELEASE
ORAL_TABLET | ORAL | 0 refills | 0.00000 days | Status: CP
Start: 2019-10-15 — End: ?

## 2019-10-15 MED ORDER — OXYCODONE 15 MG TABLET
ORAL_TABLET | ORAL | 0 refills | 30.00000 days | Status: CP | PRN
Start: 2019-10-15 — End: 2019-11-14

## 2019-10-18 DIAGNOSIS — J441 Chronic obstructive pulmonary disease with (acute) exacerbation: Principal | ICD-10-CM

## 2019-10-20 ENCOUNTER — Ambulatory Visit
Admit: 2019-10-20 | Discharge: 2019-10-20 | Payer: MEDICARE | Attending: Student in an Organized Health Care Education/Training Program | Primary: Student in an Organized Health Care Education/Training Program

## 2019-10-20 ENCOUNTER — Ambulatory Visit: Admit: 2019-10-20 | Discharge: 2019-10-20 | Payer: MEDICARE

## 2019-10-20 ENCOUNTER — Ambulatory Visit
Admit: 2019-10-20 | Discharge: 2019-10-20 | Payer: MEDICARE | Attending: Hematology & Oncology | Primary: Hematology & Oncology

## 2019-10-20 DIAGNOSIS — C3490 Malignant neoplasm of unspecified part of unspecified bronchus or lung: Principal | ICD-10-CM

## 2019-10-20 DIAGNOSIS — Z430 Encounter for attention to tracheostomy: Principal | ICD-10-CM

## 2019-10-20 DIAGNOSIS — J449 Chronic obstructive pulmonary disease, unspecified: Principal | ICD-10-CM

## 2019-10-20 DIAGNOSIS — J441 Chronic obstructive pulmonary disease with (acute) exacerbation: Secondary | ICD-10-CM | POA: Diagnosis not present

## 2019-10-20 DIAGNOSIS — C3402 Malignant neoplasm of left main bronchus: Secondary | ICD-10-CM | POA: Diagnosis not present

## 2019-10-20 MED ORDER — ALBUTEROL SULFATE 2.5 MG/3 ML (0.083 %) SOLUTION FOR NEBULIZATION: 3 mg | mL | Freq: Four times a day (QID) | 3 refills | 30 days | Status: AC

## 2019-10-20 MED ORDER — ALBUTEROL SULFATE 2.5 MG/3 ML (0.083 %) SOLUTION FOR NEBULIZATION
Freq: Four times a day (QID) | RESPIRATORY_TRACT | 0 refills | 1.00000 days | Status: CN | PRN
Start: 2019-10-20 — End: 2020-01-18

## 2019-10-21 DIAGNOSIS — C3490 Malignant neoplasm of unspecified part of unspecified bronchus or lung: Principal | ICD-10-CM

## 2019-11-09 DIAGNOSIS — Z79899 Other long term (current) drug therapy: Principal | ICD-10-CM

## 2019-11-09 DIAGNOSIS — C3402 Malignant neoplasm of left main bronchus: Principal | ICD-10-CM

## 2019-11-09 DIAGNOSIS — C329 Malignant neoplasm of larynx, unspecified: Principal | ICD-10-CM

## 2019-11-10 ENCOUNTER — Other Ambulatory Visit: Admit: 2019-11-10 | Discharge: 2019-11-10 | Payer: MEDICARE

## 2019-11-10 ENCOUNTER — Ambulatory Visit: Admit: 2019-11-10 | Discharge: 2019-11-10 | Payer: MEDICARE

## 2019-11-10 ENCOUNTER — Ambulatory Visit
Admit: 2019-11-10 | Discharge: 2019-11-10 | Payer: MEDICARE | Attending: Hematology & Oncology | Primary: Hematology & Oncology

## 2019-11-10 DIAGNOSIS — Z430 Encounter for attention to tracheostomy: Principal | ICD-10-CM

## 2019-11-10 DIAGNOSIS — M62838 Other muscle spasm: Principal | ICD-10-CM

## 2019-11-10 DIAGNOSIS — C3490 Malignant neoplasm of unspecified part of unspecified bronchus or lung: Principal | ICD-10-CM

## 2019-11-10 DIAGNOSIS — Z79899 Other long term (current) drug therapy: Principal | ICD-10-CM

## 2019-11-10 DIAGNOSIS — C3402 Malignant neoplasm of left main bronchus: Principal | ICD-10-CM

## 2019-11-10 DIAGNOSIS — C329 Malignant neoplasm of larynx, unspecified: Principal | ICD-10-CM

## 2019-11-10 DIAGNOSIS — G893 Neoplasm related pain (acute) (chronic): Principal | ICD-10-CM

## 2019-11-10 DIAGNOSIS — C7951 Secondary malignant neoplasm of bone: Secondary | ICD-10-CM | POA: Diagnosis not present

## 2019-11-10 DIAGNOSIS — C3432 Malignant neoplasm of lower lobe, left bronchus or lung: Secondary | ICD-10-CM | POA: Diagnosis not present

## 2019-11-10 DIAGNOSIS — Z923 Personal history of irradiation: Secondary | ICD-10-CM | POA: Diagnosis not present

## 2019-11-10 DIAGNOSIS — Z5112 Encounter for antineoplastic immunotherapy: Secondary | ICD-10-CM | POA: Diagnosis not present

## 2019-11-10 DIAGNOSIS — E871 Hypo-osmolality and hyponatremia: Secondary | ICD-10-CM | POA: Diagnosis not present

## 2019-11-10 DIAGNOSIS — Z930 Tracheostomy status: Secondary | ICD-10-CM | POA: Diagnosis not present

## 2019-11-10 DIAGNOSIS — C321 Malignant neoplasm of supraglottis: Secondary | ICD-10-CM | POA: Diagnosis not present

## 2019-11-10 MED ORDER — CYCLOBENZAPRINE 5 MG TABLET
ORAL_TABLET | Freq: Two times a day (BID) | ORAL | 0 refills | 30 days | Status: CP | PRN
Start: 2019-11-10 — End: ?

## 2019-11-11 MED ORDER — OXYCODONE 15 MG TABLET: 15 mg | tablet | 0 refills | 30 days | Status: AC

## 2019-11-11 MED ORDER — OXYCODONE 15 MG TABLET
ORAL_TABLET | ORAL | 0 refills | 30.00000 days | Status: CP | PRN
Start: 2019-11-11 — End: 2019-12-11

## 2019-11-11 MED ORDER — MORPHINE ER 30 MG TABLET,EXTENDED RELEASE
ORAL_TABLET | 0 refills | 0 days | Status: CP
Start: 2019-11-11 — End: ?

## 2019-11-12 DIAGNOSIS — M62830 Muscle spasm of back: Principal | ICD-10-CM

## 2019-11-12 MED ORDER — PROCHLORPERAZINE MALEATE 10 MG TABLET
ORAL_TABLET | Freq: Three times a day (TID) | ORAL | 0 refills | 10.00000 days | Status: CP | PRN
Start: 2019-11-12 — End: ?

## 2019-11-26 DIAGNOSIS — M62830 Muscle spasm of back: Principal | ICD-10-CM

## 2019-11-27 MED ORDER — PROCHLORPERAZINE MALEATE 10 MG TABLET
ORAL_TABLET | Freq: Three times a day (TID) | ORAL | 0 refills | 10.00000 days | PRN
Start: 2019-11-27 — End: 2020-11-26

## 2019-11-30 MED ORDER — PROCHLORPERAZINE MALEATE 10 MG TABLET
ORAL_TABLET | Freq: Three times a day (TID) | ORAL | 0 refills | 10.00000 days | Status: CP | PRN
Start: 2019-11-30 — End: 2020-11-29

## 2019-12-10 DIAGNOSIS — M62830 Muscle spasm of back: Principal | ICD-10-CM

## 2019-12-10 DIAGNOSIS — M62838 Other muscle spasm: Principal | ICD-10-CM

## 2019-12-10 MED ORDER — PROCHLORPERAZINE MALEATE 10 MG TABLET
ORAL_TABLET | 0 refills | 0 days
Start: 2019-12-10 — End: ?

## 2019-12-10 MED ORDER — CYCLOBENZAPRINE 5 MG TABLET
ORAL_TABLET | 0 refills | 0 days
Start: 2019-12-10 — End: ?

## 2019-12-13 MED ORDER — PROCHLORPERAZINE MALEATE 10 MG TABLET
ORAL_TABLET | 0 refills | 0 days | Status: CP
Start: 2019-12-13 — End: 2019-12-31

## 2019-12-15 ENCOUNTER — Ambulatory Visit
Admit: 2019-12-15 | Discharge: 2019-12-15 | Disposition: A | Discharge status: Home / Self Care | Payer: MEDICARE | Attending: Emergency Medicine

## 2019-12-15 ENCOUNTER — Other Ambulatory Visit
Admit: 2019-12-15 | Discharge: 2019-12-15 | Disposition: A | Discharge status: Home / Self Care | Payer: MEDICARE | Attending: Emergency Medicine

## 2019-12-15 DIAGNOSIS — E871 Hypo-osmolality and hyponatremia: Principal | ICD-10-CM

## 2019-12-15 DIAGNOSIS — L598 Other specified disorders of the skin and subcutaneous tissue related to radiation: Principal | ICD-10-CM

## 2019-12-15 DIAGNOSIS — Z87891 Personal history of nicotine dependence: Principal | ICD-10-CM

## 2019-12-15 DIAGNOSIS — R093 Abnormal sputum: Principal | ICD-10-CM

## 2019-12-15 DIAGNOSIS — Z79890 Hormone replacement therapy (postmenopausal): Principal | ICD-10-CM

## 2019-12-15 DIAGNOSIS — M62838 Other muscle spasm: Principal | ICD-10-CM

## 2019-12-15 DIAGNOSIS — I10 Essential (primary) hypertension: Principal | ICD-10-CM

## 2019-12-15 DIAGNOSIS — Z20822 Contact with and (suspected) exposure to covid-19: Principal | ICD-10-CM

## 2019-12-15 DIAGNOSIS — Z8619 Personal history of other infectious and parasitic diseases: Principal | ICD-10-CM

## 2019-12-15 DIAGNOSIS — G893 Neoplasm related pain (acute) (chronic): Principal | ICD-10-CM

## 2019-12-15 DIAGNOSIS — C3402 Malignant neoplasm of left main bronchus: Principal | ICD-10-CM

## 2019-12-15 DIAGNOSIS — J690 Pneumonitis due to inhalation of food and vomit: Principal | ICD-10-CM

## 2019-12-15 DIAGNOSIS — N179 Acute kidney failure, unspecified: Principal | ICD-10-CM

## 2019-12-15 DIAGNOSIS — M542 Cervicalgia: Principal | ICD-10-CM

## 2019-12-15 DIAGNOSIS — C329 Malignant neoplasm of larynx, unspecified: Principal | ICD-10-CM

## 2019-12-15 DIAGNOSIS — Z79899 Other long term (current) drug therapy: Principal | ICD-10-CM

## 2019-12-15 DIAGNOSIS — Z8521 Personal history of malignant neoplasm of larynx: Principal | ICD-10-CM

## 2019-12-15 DIAGNOSIS — C3432 Malignant neoplasm of lower lobe, left bronchus or lung: Principal | ICD-10-CM

## 2019-12-15 DIAGNOSIS — Z930 Tracheostomy status: Principal | ICD-10-CM

## 2019-12-15 DIAGNOSIS — R7989 Other specified abnormal findings of blood chemistry: Principal | ICD-10-CM

## 2019-12-15 DIAGNOSIS — C3492 Malignant neoplasm of unspecified part of left bronchus or lung: Principal | ICD-10-CM

## 2019-12-15 DIAGNOSIS — C7951 Secondary malignant neoplasm of bone: Principal | ICD-10-CM

## 2019-12-15 DIAGNOSIS — C3490 Malignant neoplasm of unspecified part of unspecified bronchus or lung: Principal | ICD-10-CM

## 2019-12-15 DIAGNOSIS — J189 Pneumonia, unspecified organism: Principal | ICD-10-CM

## 2019-12-15 DIAGNOSIS — J181 Lobar pneumonia, unspecified organism: Secondary | ICD-10-CM | POA: Diagnosis not present

## 2019-12-15 DIAGNOSIS — M799 Soft tissue disorder, unspecified: Secondary | ICD-10-CM | POA: Diagnosis not present

## 2019-12-15 DIAGNOSIS — J8410 Pulmonary fibrosis, unspecified: Secondary | ICD-10-CM | POA: Diagnosis not present

## 2019-12-15 DIAGNOSIS — R590 Localized enlarged lymph nodes: Secondary | ICD-10-CM | POA: Diagnosis not present

## 2019-12-15 DIAGNOSIS — R630 Anorexia: Secondary | ICD-10-CM | POA: Diagnosis not present

## 2019-12-15 DIAGNOSIS — I6523 Occlusion and stenosis of bilateral carotid arteries: Secondary | ICD-10-CM | POA: Diagnosis not present

## 2019-12-15 DIAGNOSIS — N132 Hydronephrosis with renal and ureteral calculous obstruction: Secondary | ICD-10-CM | POA: Diagnosis not present

## 2019-12-15 DIAGNOSIS — J384 Edema of larynx: Secondary | ICD-10-CM | POA: Diagnosis not present

## 2019-12-15 DIAGNOSIS — R918 Other nonspecific abnormal finding of lung field: Secondary | ICD-10-CM | POA: Diagnosis not present

## 2019-12-15 DIAGNOSIS — R1110 Vomiting, unspecified: Secondary | ICD-10-CM | POA: Diagnosis not present

## 2019-12-15 DIAGNOSIS — R9341 Abnormal radiologic findings on diagnostic imaging of renal pelvis, ureter, or bladder: Secondary | ICD-10-CM | POA: Diagnosis not present

## 2019-12-15 MED ORDER — OXYCODONE 15 MG TABLET
ORAL_TABLET | ORAL | 0 refills | 30.00000 days | Status: CP | PRN
Start: 2019-12-15 — End: 2020-01-13

## 2019-12-15 MED ORDER — LEVOFLOXACIN 750 MG TABLET
ORAL_TABLET | Freq: Every day | ORAL | 0 refills | 7 days | Status: CP
Start: 2019-12-15 — End: 2019-12-22

## 2019-12-15 MED ORDER — SODIUM CHLORIDE 1 GRAM TABLET
ORAL_TABLET | Freq: Two times a day (BID) | ORAL | 0 refills | 14 days | Status: CP
Start: 2019-12-15 — End: 2019-12-29

## 2019-12-16 MED ORDER — CYCLOBENZAPRINE 5 MG TABLET
ORAL_TABLET | Freq: Three times a day (TID) | ORAL | 0 refills | 30.00000 days | Status: CP | PRN
Start: 2019-12-16 — End: 2020-01-11

## 2019-12-22 ENCOUNTER — Ambulatory Visit: Admit: 2019-12-22 | Discharge: 2019-12-23 | Payer: MEDICARE

## 2019-12-22 DIAGNOSIS — C3490 Malignant neoplasm of unspecified part of unspecified bronchus or lung: Principal | ICD-10-CM

## 2019-12-22 DIAGNOSIS — E222 Syndrome of inappropriate secretion of antidiuretic hormone: Principal | ICD-10-CM

## 2019-12-22 DIAGNOSIS — C329 Malignant neoplasm of larynx, unspecified: Principal | ICD-10-CM

## 2019-12-22 DIAGNOSIS — C7951 Secondary malignant neoplasm of bone: Principal | ICD-10-CM

## 2019-12-22 DIAGNOSIS — N179 Acute kidney failure, unspecified: Principal | ICD-10-CM

## 2019-12-22 DIAGNOSIS — C3402 Malignant neoplasm of left main bronchus: Principal | ICD-10-CM

## 2019-12-22 DIAGNOSIS — Z79899 Other long term (current) drug therapy: Principal | ICD-10-CM

## 2019-12-22 DIAGNOSIS — E871 Hypo-osmolality and hyponatremia: Principal | ICD-10-CM

## 2019-12-24 MED ORDER — TAMSULOSIN 0.4 MG CAPSULE
ORAL_CAPSULE | 0 refills | 0 days | Status: CP
Start: 2019-12-24 — End: ?

## 2019-12-24 MED ORDER — GABAPENTIN 300 MG CAPSULE
ORAL_CAPSULE | 0 refills | 0 days | Status: CP
Start: 2019-12-24 — End: ?

## 2019-12-29 ENCOUNTER — Ambulatory Visit
Admit: 2019-12-29 | Discharge: 2019-12-30 | Payer: MEDICARE | Attending: Speech-Language Pathologist | Primary: Speech-Language Pathologist

## 2019-12-29 ENCOUNTER — Ambulatory Visit: Admit: 2019-12-29 | Discharge: 2019-12-30 | Payer: MEDICARE

## 2019-12-29 ENCOUNTER — Ambulatory Visit: Admit: 2019-12-29 | Discharge: 2019-12-30 | Payer: MEDICARE | Attending: Adult Health | Primary: Adult Health

## 2019-12-29 DIAGNOSIS — C7951 Secondary malignant neoplasm of bone: Principal | ICD-10-CM

## 2019-12-29 DIAGNOSIS — M4802 Spinal stenosis, cervical region: Principal | ICD-10-CM

## 2019-12-29 DIAGNOSIS — Z930 Tracheostomy status: Principal | ICD-10-CM

## 2019-12-29 DIAGNOSIS — J384 Edema of larynx: Principal | ICD-10-CM

## 2019-12-29 DIAGNOSIS — J8410 Pulmonary fibrosis, unspecified: Principal | ICD-10-CM

## 2019-12-29 DIAGNOSIS — C3402 Malignant neoplasm of left main bronchus: Principal | ICD-10-CM

## 2019-12-29 DIAGNOSIS — Z9221 Personal history of antineoplastic chemotherapy: Principal | ICD-10-CM

## 2019-12-29 DIAGNOSIS — C3432 Malignant neoplasm of lower lobe, left bronchus or lung: Principal | ICD-10-CM

## 2019-12-29 DIAGNOSIS — F17210 Nicotine dependence, cigarettes, uncomplicated: Principal | ICD-10-CM

## 2019-12-29 DIAGNOSIS — R590 Localized enlarged lymph nodes: Principal | ICD-10-CM

## 2019-12-29 DIAGNOSIS — I6521 Occlusion and stenosis of right carotid artery: Principal | ICD-10-CM

## 2019-12-29 DIAGNOSIS — Z79899 Other long term (current) drug therapy: Principal | ICD-10-CM

## 2019-12-29 DIAGNOSIS — I10 Essential (primary) hypertension: Principal | ICD-10-CM

## 2019-12-29 DIAGNOSIS — Z79891 Long term (current) use of opiate analgesic: Principal | ICD-10-CM

## 2019-12-29 DIAGNOSIS — E222 Syndrome of inappropriate secretion of antidiuretic hormone: Principal | ICD-10-CM

## 2019-12-29 DIAGNOSIS — N132 Hydronephrosis with renal and ureteral calculous obstruction: Principal | ICD-10-CM

## 2019-12-29 DIAGNOSIS — C329 Malignant neoplasm of larynx, unspecified: Principal | ICD-10-CM

## 2019-12-29 DIAGNOSIS — C3490 Malignant neoplasm of unspecified part of unspecified bronchus or lung: Principal | ICD-10-CM

## 2019-12-31 DIAGNOSIS — L299 Pruritus, unspecified: Principal | ICD-10-CM

## 2019-12-31 DIAGNOSIS — M62830 Muscle spasm of back: Principal | ICD-10-CM

## 2019-12-31 MED ORDER — PROCHLORPERAZINE MALEATE 10 MG TABLET
ORAL_TABLET | Freq: Three times a day (TID) | ORAL | 0 refills | 10.00000 days | Status: CP | PRN
Start: 2019-12-31 — End: 2020-01-24

## 2019-12-31 MED ORDER — HYDROXYZINE HCL 25 MG TABLET
ORAL_TABLET | 0 refills | 0 days
Start: 2019-12-31 — End: ?

## 2020-01-03 MED ORDER — HYDROXYZINE HCL 25 MG TABLET
ORAL_TABLET | 0 refills | 0 days
Start: 2020-01-03 — End: ?

## 2020-01-10 DIAGNOSIS — K74 Fibrosis of liver: Principal | ICD-10-CM

## 2020-01-10 DIAGNOSIS — M62838 Other muscle spasm: Principal | ICD-10-CM

## 2020-01-10 DIAGNOSIS — L299 Pruritus, unspecified: Principal | ICD-10-CM

## 2020-01-10 MED ORDER — CYCLOBENZAPRINE 5 MG TABLET
ORAL_TABLET | 0 refills | 0 days
Start: 2020-01-10 — End: ?

## 2020-01-10 MED ORDER — HYDROXYZINE HCL 25 MG TABLET
ORAL_TABLET | Freq: Three times a day (TID) | ORAL | 0 refills | 60 days | Status: CP | PRN
Start: 2020-01-10 — End: 2020-03-10

## 2020-01-11 MED ORDER — CYCLOBENZAPRINE 5 MG TABLET
ORAL_TABLET | 0 refills | 0 days | Status: CP
Start: 2020-01-11 — End: 2020-02-04

## 2020-01-12 DIAGNOSIS — Z515 Encounter for palliative care: Principal | ICD-10-CM

## 2020-01-12 DIAGNOSIS — R252 Cramp and spasm: Principal | ICD-10-CM

## 2020-01-12 DIAGNOSIS — C3402 Malignant neoplasm of left main bronchus: Principal | ICD-10-CM

## 2020-01-12 DIAGNOSIS — G893 Neoplasm related pain (acute) (chronic): Principal | ICD-10-CM

## 2020-01-13 DIAGNOSIS — E871 Hypo-osmolality and hyponatremia: Principal | ICD-10-CM

## 2020-01-13 MED ORDER — OXYCODONE 15 MG TABLET
ORAL_TABLET | ORAL | 0 refills | 30.00000 days | Status: CP | PRN
Start: 2020-01-13 — End: 2020-02-12

## 2020-01-13 MED ORDER — MORPHINE ER 30 MG TABLET,EXTENDED RELEASE
ORAL_TABLET | ORAL | 0 refills | 0.00000 days | Status: CP
Start: 2020-01-13 — End: 2020-02-02

## 2020-01-22 DIAGNOSIS — M62830 Muscle spasm of back: Principal | ICD-10-CM

## 2020-01-22 DIAGNOSIS — C329 Malignant neoplasm of larynx, unspecified: Principal | ICD-10-CM

## 2020-01-22 MED ORDER — ESCITALOPRAM 20 MG TABLET
ORAL_TABLET | 0 refills | 0 days
Start: 2020-01-22 — End: ?

## 2020-01-22 MED ORDER — PROCHLORPERAZINE MALEATE 10 MG TABLET
ORAL_TABLET | 0 refills | 0 days
Start: 2020-01-22 — End: ?

## 2020-01-24 MED ORDER — PROCHLORPERAZINE MALEATE 10 MG TABLET
ORAL_TABLET | ORAL | 0 refills | 0.00000 days | Status: CP
Start: 2020-01-24 — End: 2020-02-08

## 2020-02-02 ENCOUNTER — Telehealth: Admit: 2020-02-02 | Discharge: 2020-02-03 | Payer: MEDICARE

## 2020-02-02 DIAGNOSIS — E871 Hypo-osmolality and hyponatremia: Principal | ICD-10-CM

## 2020-02-02 DIAGNOSIS — C3402 Malignant neoplasm of left main bronchus: Principal | ICD-10-CM

## 2020-02-02 DIAGNOSIS — G893 Neoplasm related pain (acute) (chronic): Principal | ICD-10-CM

## 2020-02-02 MED ORDER — OXYCODONE 15 MG TABLET
ORAL_TABLET | ORAL | 0 refills | 30 days | Status: CP | PRN
Start: 2020-02-02 — End: 2020-03-03

## 2020-02-02 MED ORDER — MORPHINE ER 30 MG TABLET,EXTENDED RELEASE
ORAL_TABLET | ORAL | 0 refills | 0.00000 days | Status: CP
Start: 2020-02-02 — End: ?

## 2020-02-04 DIAGNOSIS — M62838 Other muscle spasm: Principal | ICD-10-CM

## 2020-02-04 DIAGNOSIS — M62830 Muscle spasm of back: Principal | ICD-10-CM

## 2020-02-04 MED ORDER — CYCLOBENZAPRINE 5 MG TABLET
ORAL_TABLET | 0 refills | 0 days | Status: CP
Start: 2020-02-04 — End: 2020-03-03

## 2020-02-04 MED ORDER — PROCHLORPERAZINE MALEATE 10 MG TABLET
ORAL_TABLET | 0 refills | 0 days
Start: 2020-02-04 — End: ?

## 2020-02-05 DIAGNOSIS — C329 Malignant neoplasm of larynx, unspecified: Principal | ICD-10-CM

## 2020-02-05 DIAGNOSIS — M62830 Muscle spasm of back: Principal | ICD-10-CM

## 2020-02-05 MED ORDER — PROCHLORPERAZINE MALEATE 10 MG TABLET
ORAL_TABLET | 0 refills | 0 days
Start: 2020-02-05 — End: ?

## 2020-02-05 MED ORDER — ESCITALOPRAM 20 MG TABLET
ORAL_TABLET | 0 refills | 0 days
Start: 2020-02-05 — End: ?

## 2020-02-08 MED ORDER — PROCHLORPERAZINE MALEATE 10 MG TABLET
ORAL_TABLET | 0 refills | 0 days | Status: CP
Start: 2020-02-08 — End: 2020-02-25

## 2020-02-23 ENCOUNTER — Other Ambulatory Visit: Admit: 2020-02-23 | Discharge: 2020-02-24 | Payer: MEDICARE

## 2020-02-23 ENCOUNTER — Ambulatory Visit: Admit: 2020-02-23 | Discharge: 2020-02-24 | Payer: MEDICARE

## 2020-02-23 DIAGNOSIS — C329 Malignant neoplasm of larynx, unspecified: Principal | ICD-10-CM

## 2020-02-23 DIAGNOSIS — C3402 Malignant neoplasm of left main bronchus: Principal | ICD-10-CM

## 2020-02-23 DIAGNOSIS — Z79899 Other long term (current) drug therapy: Principal | ICD-10-CM

## 2020-02-23 DIAGNOSIS — C7951 Secondary malignant neoplasm of bone: Principal | ICD-10-CM

## 2020-02-23 DIAGNOSIS — N179 Acute kidney failure, unspecified: Principal | ICD-10-CM

## 2020-02-23 DIAGNOSIS — E222 Syndrome of inappropriate secretion of antidiuretic hormone: Principal | ICD-10-CM

## 2020-02-25 DIAGNOSIS — M62830 Muscle spasm of back: Principal | ICD-10-CM

## 2020-02-25 DIAGNOSIS — I10 Essential (primary) hypertension: Principal | ICD-10-CM

## 2020-02-25 MED ORDER — PROCHLORPERAZINE MALEATE 10 MG TABLET
ORAL_TABLET | 0 refills | 0 days | Status: CP
Start: 2020-02-25 — End: ?

## 2020-02-25 MED ORDER — LISINOPRIL 10 MG TABLET
ORAL_TABLET | Freq: Every day | ORAL | 0 refills | 90 days
Start: 2020-02-25 — End: 2021-02-24

## 2020-02-29 DIAGNOSIS — I10 Essential (primary) hypertension: Principal | ICD-10-CM

## 2020-02-29 MED ORDER — LISINOPRIL 10 MG TABLET
ORAL_TABLET | 0 refills | 0 days
Start: 2020-02-29 — End: ?

## 2020-03-01 DIAGNOSIS — I10 Essential (primary) hypertension: Principal | ICD-10-CM

## 2020-03-01 MED ORDER — LISINOPRIL 10 MG TABLET
ORAL_TABLET | 0 refills | 0 days
Start: 2020-03-01 — End: ?

## 2020-03-03 DIAGNOSIS — M62838 Other muscle spasm: Principal | ICD-10-CM

## 2020-03-03 MED ORDER — CYCLOBENZAPRINE 5 MG TABLET
ORAL_TABLET | 0 refills | 0 days | Status: CP
Start: 2020-03-03 — End: ?

## 2020-03-08 ENCOUNTER — Ambulatory Visit: Admit: 2020-03-08 | Discharge: 2020-03-09 | Payer: MEDICARE

## 2020-03-08 ENCOUNTER — Ambulatory Visit
Admit: 2020-03-08 | Discharge: 2020-03-09 | Payer: MEDICARE | Attending: Hematology & Oncology | Primary: Hematology & Oncology

## 2020-03-08 DIAGNOSIS — Z79899 Other long term (current) drug therapy: Principal | ICD-10-CM

## 2020-03-08 DIAGNOSIS — C3490 Malignant neoplasm of unspecified part of unspecified bronchus or lung: Principal | ICD-10-CM

## 2020-03-08 DIAGNOSIS — C7951 Secondary malignant neoplasm of bone: Secondary | ICD-10-CM | POA: Diagnosis not present

## 2020-03-08 DIAGNOSIS — R070 Pain in throat: Secondary | ICD-10-CM | POA: Diagnosis not present

## 2020-03-08 DIAGNOSIS — Z515 Encounter for palliative care: Secondary | ICD-10-CM | POA: Diagnosis not present

## 2020-03-08 DIAGNOSIS — Z930 Tracheostomy status: Secondary | ICD-10-CM | POA: Diagnosis not present

## 2020-03-08 DIAGNOSIS — C3402 Malignant neoplasm of left main bronchus: Secondary | ICD-10-CM | POA: Diagnosis not present

## 2020-03-08 DIAGNOSIS — G893 Neoplasm related pain (acute) (chronic): Secondary | ICD-10-CM | POA: Diagnosis not present

## 2020-03-08 DIAGNOSIS — E871 Hypo-osmolality and hyponatremia: Secondary | ICD-10-CM | POA: Diagnosis not present

## 2020-03-08 DIAGNOSIS — Z9221 Personal history of antineoplastic chemotherapy: Secondary | ICD-10-CM | POA: Diagnosis not present

## 2020-03-08 DIAGNOSIS — C3432 Malignant neoplasm of lower lobe, left bronchus or lung: Secondary | ICD-10-CM | POA: Diagnosis not present

## 2020-03-08 DIAGNOSIS — R590 Localized enlarged lymph nodes: Secondary | ICD-10-CM | POA: Diagnosis not present

## 2020-03-08 DIAGNOSIS — R609 Edema, unspecified: Secondary | ICD-10-CM | POA: Diagnosis not present

## 2020-03-08 DIAGNOSIS — M4802 Spinal stenosis, cervical region: Secondary | ICD-10-CM | POA: Diagnosis not present

## 2020-03-09 MED ORDER — OXYCODONE 15 MG TABLET
ORAL_TABLET | ORAL | 0 refills | 30 days | Status: CP | PRN
Start: 2020-03-09 — End: 2020-04-08

## 2020-03-09 MED ORDER — MORPHINE ER 30 MG TABLET,EXTENDED RELEASE
ORAL_TABLET | 0 refills | 0 days | Status: CP
Start: 2020-03-09 — End: ?

## 2020-03-10 DIAGNOSIS — M62830 Muscle spasm of back: Principal | ICD-10-CM

## 2020-03-10 DIAGNOSIS — I10 Essential (primary) hypertension: Principal | ICD-10-CM

## 2020-03-10 MED ORDER — PROCHLORPERAZINE MALEATE 10 MG TABLET
ORAL_TABLET | 0 refills | 0 days | Status: CP
Start: 2020-03-10 — End: ?

## 2020-03-10 MED ORDER — LISINOPRIL 10 MG TABLET
ORAL_TABLET | Freq: Every day | ORAL | 0 refills | 90 days | Status: CP
Start: 2020-03-10 — End: ?

## 2020-03-17 MED ORDER — TAMSULOSIN 0.4 MG CAPSULE
ORAL_CAPSULE | 0 refills | 0 days
Start: 2020-03-17 — End: ?

## 2020-03-17 MED ORDER — GABAPENTIN 300 MG CAPSULE
ORAL_CAPSULE | 0 refills | 0 days
Start: 2020-03-17 — End: ?

## 2020-03-31 DIAGNOSIS — M62830 Muscle spasm of back: Principal | ICD-10-CM

## 2020-03-31 MED ORDER — PROCHLORPERAZINE MALEATE 10 MG TABLET
ORAL_TABLET | 0 refills | 0 days
Start: 2020-03-31 — End: ?

## 2020-04-03 DIAGNOSIS — R7989 Other specified abnormal findings of blood chemistry: Principal | ICD-10-CM

## 2020-04-03 MED ORDER — LEVOTHYROXINE 50 MCG TABLET
ORAL_TABLET | Freq: Every day | ORAL | 11 refills | 30 days | Status: CP
Start: 2020-04-03 — End: 2021-04-03

## 2020-04-05 ENCOUNTER — Telehealth: Admit: 2020-04-05 | Discharge: 2020-04-06 | Payer: MEDICARE

## 2020-04-05 DIAGNOSIS — F32A Depression, unspecified: Secondary | ICD-10-CM | POA: Diagnosis not present

## 2020-04-05 DIAGNOSIS — C329 Malignant neoplasm of larynx, unspecified: Secondary | ICD-10-CM | POA: Diagnosis not present

## 2020-04-05 DIAGNOSIS — G893 Neoplasm related pain (acute) (chronic): Secondary | ICD-10-CM | POA: Diagnosis not present

## 2020-04-05 DIAGNOSIS — C3402 Malignant neoplasm of left main bronchus: Secondary | ICD-10-CM | POA: Diagnosis not present

## 2020-04-05 DIAGNOSIS — G4700 Insomnia, unspecified: Secondary | ICD-10-CM | POA: Diagnosis not present

## 2020-04-06 MED ORDER — OXYCODONE 15 MG TABLET
ORAL_TABLET | ORAL | 0 refills | 30 days | Status: CP | PRN
Start: 2020-04-06 — End: 2020-05-06

## 2020-04-06 MED ORDER — GABAPENTIN 300 MG CAPSULE
ORAL_CAPSULE | Freq: Every evening | ORAL | 1 refills | 30.00000 days | Status: CP
Start: 2020-04-06 — End: ?

## 2020-04-06 MED ORDER — MIRTAZAPINE 7.5 MG TABLET
ORAL_TABLET | Freq: Every evening | ORAL | 0 refills | 30 days | Status: CP
Start: 2020-04-06 — End: 2020-05-06

## 2020-04-21 MED ORDER — ESCITALOPRAM 20 MG TABLET
Freq: Every day | ORAL | 0 days
Start: 2020-04-21 — End: ?

## 2020-04-23 DIAGNOSIS — M62830 Muscle spasm of back: Principal | ICD-10-CM

## 2020-04-23 DIAGNOSIS — L299 Pruritus, unspecified: Principal | ICD-10-CM

## 2020-04-23 DIAGNOSIS — K74 Fibrosis of liver: Principal | ICD-10-CM

## 2020-04-23 MED ORDER — HYDROXYZINE HCL 25 MG TABLET
ORAL_TABLET | 0 refills | 0 days
Start: 2020-04-23 — End: ?

## 2020-04-23 MED ORDER — PROCHLORPERAZINE MALEATE 10 MG TABLET
ORAL_TABLET | 0 refills | 0 days
Start: 2020-04-23 — End: ?

## 2020-04-24 MED ORDER — HYDROXYZINE HCL 25 MG TABLET
ORAL_TABLET | 0 refills | 0 days
Start: 2020-04-24 — End: ?

## 2020-04-24 MED ORDER — PROCHLORPERAZINE MALEATE 10 MG TABLET
ORAL_TABLET | 0 refills | 0 days
Start: 2020-04-24 — End: ?

## 2020-04-28 DIAGNOSIS — M62830 Muscle spasm of back: Principal | ICD-10-CM

## 2020-04-28 DIAGNOSIS — L299 Pruritus, unspecified: Principal | ICD-10-CM

## 2020-04-28 DIAGNOSIS — K74 Fibrosis of liver: Principal | ICD-10-CM

## 2020-04-28 MED ORDER — HYDROXYZINE HCL 25 MG TABLET
ORAL_TABLET | 0 refills | 0 days
Start: 2020-04-28 — End: ?

## 2020-04-28 MED ORDER — PROCHLORPERAZINE MALEATE 10 MG TABLET
ORAL_TABLET | 0 refills | 0 days
Start: 2020-04-28 — End: ?

## 2020-05-02 ENCOUNTER — Ambulatory Visit: Admit: 2020-05-02 | Discharge: 2020-05-03 | Payer: MEDICARE

## 2020-05-02 DIAGNOSIS — F32A Depression, unspecified depression type: Principal | ICD-10-CM

## 2020-05-02 DIAGNOSIS — C7951 Secondary malignant neoplasm of bone: Principal | ICD-10-CM

## 2020-05-02 DIAGNOSIS — C3402 Malignant neoplasm of left main bronchus: Principal | ICD-10-CM

## 2020-05-02 DIAGNOSIS — L299 Pruritus, unspecified: Principal | ICD-10-CM

## 2020-05-02 DIAGNOSIS — E222 Syndrome of inappropriate secretion of antidiuretic hormone: Principal | ICD-10-CM

## 2020-05-02 DIAGNOSIS — G4700 Insomnia, unspecified: Principal | ICD-10-CM

## 2020-05-02 DIAGNOSIS — R251 Tremor, unspecified: Principal | ICD-10-CM

## 2020-05-02 DIAGNOSIS — K74 Fibrosis of liver: Principal | ICD-10-CM

## 2020-05-02 DIAGNOSIS — M62830 Muscle spasm of back: Principal | ICD-10-CM

## 2020-05-02 DIAGNOSIS — C329 Malignant neoplasm of larynx, unspecified: Principal | ICD-10-CM

## 2020-05-02 DIAGNOSIS — C3490 Malignant neoplasm of unspecified part of unspecified bronchus or lung: Principal | ICD-10-CM

## 2020-05-02 DIAGNOSIS — N179 Acute kidney failure, unspecified: Principal | ICD-10-CM

## 2020-05-02 DIAGNOSIS — Z79899 Other long term (current) drug therapy: Principal | ICD-10-CM

## 2020-05-02 MED ORDER — MIRTAZAPINE 7.5 MG TABLET
ORAL_TABLET | 0 refills | 0 days | Status: CP
Start: 2020-05-02 — End: ?

## 2020-05-02 MED ORDER — PROCHLORPERAZINE MALEATE 10 MG TABLET
ORAL_TABLET | 0 refills | 0 days
Start: 2020-05-02 — End: ?

## 2020-05-03 ENCOUNTER — Telehealth: Admit: 2020-05-03 | Discharge: 2020-05-04 | Payer: MEDICARE

## 2020-05-03 DIAGNOSIS — G893 Neoplasm related pain (acute) (chronic): Principal | ICD-10-CM

## 2020-05-03 DIAGNOSIS — C329 Malignant neoplasm of larynx, unspecified: Principal | ICD-10-CM

## 2020-05-03 DIAGNOSIS — F32A Depression, unspecified depression type: Principal | ICD-10-CM

## 2020-05-03 DIAGNOSIS — G4700 Insomnia, unspecified: Principal | ICD-10-CM

## 2020-05-03 DIAGNOSIS — M62830 Muscle spasm of back: Principal | ICD-10-CM

## 2020-05-03 DIAGNOSIS — C3402 Malignant neoplasm of left main bronchus: Principal | ICD-10-CM

## 2020-05-03 DIAGNOSIS — L988 Other specified disorders of the skin and subcutaneous tissue: Secondary | ICD-10-CM | POA: Diagnosis not present

## 2020-05-03 MED ORDER — MORPHINE ER 30 MG TABLET,EXTENDED RELEASE
ORAL_TABLET | 0 refills | 0 days | Status: CP
Start: 2020-05-03 — End: ?

## 2020-05-03 MED ORDER — PROCHLORPERAZINE MALEATE 10 MG TABLET
ORAL_TABLET | Freq: Three times a day (TID) | ORAL | 2 refills | 10 days | Status: CP | PRN
Start: 2020-05-03 — End: ?

## 2020-05-03 MED ORDER — OXYCODONE 15 MG TABLET
ORAL_TABLET | ORAL | 0 refills | 30 days | Status: CP | PRN
Start: 2020-05-03 — End: 2020-06-02

## 2020-05-03 MED ORDER — MIRTAZAPINE 7.5 MG TABLET
ORAL_TABLET | Freq: Every evening | ORAL | 4 refills | 30 days | Status: CP
Start: 2020-05-03 — End: ?

## 2020-05-04 ENCOUNTER — Telehealth: Payer: Self-pay

## 2020-05-04 MED ORDER — HYDROXYZINE HCL 25 MG TABLET
ORAL_TABLET | 0 refills | 0 days
Start: 2020-05-04 — End: ?

## 2020-05-04 NOTE — Telephone Encounter (Signed)
Tried reaching out to this patient because he is listed on our reports as being our patient. Call stated that the number on file has been disconnected.

## 2020-05-08 DIAGNOSIS — C3402 Malignant neoplasm of left main bronchus: Principal | ICD-10-CM

## 2020-05-08 DIAGNOSIS — F32A Depression, unspecified depression type: Principal | ICD-10-CM

## 2020-05-08 DIAGNOSIS — M62830 Muscle spasm of back: Principal | ICD-10-CM

## 2020-05-08 DIAGNOSIS — G4700 Insomnia, unspecified: Principal | ICD-10-CM

## 2020-05-08 DIAGNOSIS — K74 Fibrosis of liver: Principal | ICD-10-CM

## 2020-05-08 DIAGNOSIS — L299 Pruritus, unspecified: Principal | ICD-10-CM

## 2020-05-08 DIAGNOSIS — G893 Neoplasm related pain (acute) (chronic): Principal | ICD-10-CM

## 2020-05-08 MED ORDER — GABAPENTIN 300 MG CAPSULE
ORAL_CAPSULE | 0 refills | 0 days | Status: CP
Start: 2020-05-08 — End: ?

## 2020-05-08 MED ORDER — PROCHLORPERAZINE MALEATE 10 MG TABLET
ORAL_TABLET | 0 refills | 0 days
Start: 2020-05-08 — End: ?

## 2020-05-08 MED ORDER — MIRTAZAPINE 7.5 MG TABLET
ORAL_TABLET | 0 refills | 0 days | Status: CP
Start: 2020-05-08 — End: ?

## 2020-05-08 MED ORDER — HYDROXYZINE HCL 25 MG TABLET
ORAL_TABLET | 0 refills | 0 days
Start: 2020-05-08 — End: ?

## 2020-05-09 DIAGNOSIS — C3490 Malignant neoplasm of unspecified part of unspecified bronchus or lung: Principal | ICD-10-CM

## 2020-05-10 ENCOUNTER — Ambulatory Visit: Admit: 2020-05-10 | Discharge: 2020-05-11 | Payer: MEDICARE | Attending: Family | Primary: Family

## 2020-05-10 DIAGNOSIS — G4700 Insomnia, unspecified: Principal | ICD-10-CM

## 2020-05-10 DIAGNOSIS — F32A Depression, unspecified depression type: Principal | ICD-10-CM

## 2020-05-10 DIAGNOSIS — C3402 Malignant neoplasm of left main bronchus: Principal | ICD-10-CM

## 2020-05-10 DIAGNOSIS — M62830 Muscle spasm of back: Principal | ICD-10-CM

## 2020-05-10 DIAGNOSIS — G893 Neoplasm related pain (acute) (chronic): Principal | ICD-10-CM

## 2020-05-10 DIAGNOSIS — L298 Other pruritus: Secondary | ICD-10-CM | POA: Diagnosis not present

## 2020-05-10 MED ORDER — GABAPENTIN 300 MG CAPSULE
ORAL_CAPSULE | 2 refills | 0 days | Status: CP
Start: 2020-05-10 — End: ?

## 2020-05-10 MED ORDER — PROCHLORPERAZINE MALEATE 10 MG TABLET
ORAL_TABLET | Freq: Three times a day (TID) | ORAL | 2 refills | 10 days | Status: CP | PRN
Start: 2020-05-10 — End: ?

## 2020-05-10 MED ORDER — MIRTAZAPINE 7.5 MG TABLET
ORAL_TABLET | Freq: Every evening | ORAL | 3 refills | 30 days | Status: CP
Start: 2020-05-10 — End: ?

## 2020-05-15 DIAGNOSIS — K74 Fibrosis of liver: Principal | ICD-10-CM

## 2020-05-15 DIAGNOSIS — L299 Pruritus, unspecified: Principal | ICD-10-CM

## 2020-05-15 MED ORDER — TAMSULOSIN 0.4 MG CAPSULE
ORAL_CAPSULE | 0 refills | 0 days
Start: 2020-05-15 — End: ?

## 2020-05-15 MED ORDER — HYDROXYZINE HCL 25 MG TABLET
ORAL_TABLET | 0 refills | 0 days
Start: 2020-05-15 — End: ?

## 2020-05-17 ENCOUNTER — Ambulatory Visit: Admit: 2020-05-17 | Discharge: 2020-05-17 | Payer: MEDICARE

## 2020-05-17 ENCOUNTER — Ambulatory Visit
Admit: 2020-05-17 | Discharge: 2020-05-17 | Payer: MEDICARE | Attending: Hematology & Oncology | Primary: Hematology & Oncology

## 2020-05-17 DIAGNOSIS — F32A Depression, unspecified depression type: Principal | ICD-10-CM

## 2020-05-17 DIAGNOSIS — R1310 Dysphagia, unspecified: Principal | ICD-10-CM

## 2020-05-17 DIAGNOSIS — C3490 Malignant neoplasm of unspecified part of unspecified bronchus or lung: Principal | ICD-10-CM

## 2020-05-17 DIAGNOSIS — G893 Neoplasm related pain (acute) (chronic): Principal | ICD-10-CM

## 2020-05-17 DIAGNOSIS — G4700 Insomnia, unspecified: Principal | ICD-10-CM

## 2020-05-17 DIAGNOSIS — M542 Cervicalgia: Secondary | ICD-10-CM | POA: Diagnosis not present

## 2020-05-17 DIAGNOSIS — R918 Other nonspecific abnormal finding of lung field: Secondary | ICD-10-CM | POA: Diagnosis not present

## 2020-05-17 DIAGNOSIS — C3432 Malignant neoplasm of lower lobe, left bronchus or lung: Secondary | ICD-10-CM | POA: Diagnosis not present

## 2020-05-17 DIAGNOSIS — R590 Localized enlarged lymph nodes: Secondary | ICD-10-CM | POA: Diagnosis not present

## 2020-05-17 DIAGNOSIS — C7951 Secondary malignant neoplasm of bone: Secondary | ICD-10-CM | POA: Diagnosis not present

## 2020-05-17 DIAGNOSIS — C799 Secondary malignant neoplasm of unspecified site: Secondary | ICD-10-CM | POA: Diagnosis not present

## 2020-05-17 DIAGNOSIS — Z8521 Personal history of malignant neoplasm of larynx: Secondary | ICD-10-CM | POA: Diagnosis not present

## 2020-05-17 DIAGNOSIS — C329 Malignant neoplasm of larynx, unspecified: Secondary | ICD-10-CM | POA: Diagnosis not present

## 2020-05-17 DIAGNOSIS — J384 Edema of larynx: Secondary | ICD-10-CM | POA: Diagnosis not present

## 2020-05-17 DIAGNOSIS — C7989 Secondary malignant neoplasm of other specified sites: Secondary | ICD-10-CM | POA: Diagnosis not present

## 2020-05-17 MED ORDER — OXYCODONE 15 MG TABLET
ORAL_TABLET | ORAL | 0 refills | 25 days | Status: CP | PRN
Start: 2020-05-17 — End: 2020-06-16

## 2020-05-17 MED ORDER — TRAZODONE 50 MG TABLET
ORAL_TABLET | Freq: Every evening | ORAL | 1 refills | 60 days | Status: CP | PRN
Start: 2020-05-17 — End: ?

## 2020-05-17 MED ORDER — MORPHINE ER 30 MG TABLET,EXTENDED RELEASE
ORAL_TABLET | 0 refills | 0 days | Status: CP
Start: 2020-05-17 — End: ?

## 2020-05-17 MED ORDER — MIRTAZAPINE 30 MG TABLET
ORAL_TABLET | Freq: Every evening | ORAL | 2 refills | 60 days | Status: CP
Start: 2020-05-17 — End: 2020-08-15

## 2020-05-24 ENCOUNTER — Ambulatory Visit: Admit: 2020-05-24 | Discharge: 2020-05-25 | Payer: MEDICARE

## 2020-05-24 ENCOUNTER — Ambulatory Visit
Admit: 2020-05-24 | Discharge: 2020-05-25 | Payer: MEDICARE | Attending: Hematology & Oncology | Primary: Hematology & Oncology

## 2020-05-24 ENCOUNTER — Ambulatory Visit: Admit: 2020-05-24 | Discharge: 2020-05-25 | Payer: MEDICARE | Attending: Dermatology | Primary: Dermatology

## 2020-05-24 DIAGNOSIS — D492 Neoplasm of unspecified behavior of bone, soft tissue, and skin: Principal | ICD-10-CM

## 2020-05-24 DIAGNOSIS — L988 Other specified disorders of the skin and subcutaneous tissue: Principal | ICD-10-CM

## 2020-05-24 DIAGNOSIS — J984 Other disorders of lung: Secondary | ICD-10-CM | POA: Diagnosis not present

## 2020-05-24 DIAGNOSIS — I313 Pericardial effusion (noninflammatory): Secondary | ICD-10-CM | POA: Diagnosis not present

## 2020-05-24 DIAGNOSIS — D692 Other nonthrombocytopenic purpura: Secondary | ICD-10-CM | POA: Diagnosis not present

## 2020-05-24 DIAGNOSIS — N1330 Unspecified hydronephrosis: Secondary | ICD-10-CM | POA: Diagnosis not present

## 2020-05-24 DIAGNOSIS — R918 Other nonspecific abnormal finding of lung field: Secondary | ICD-10-CM | POA: Diagnosis not present

## 2020-05-24 DIAGNOSIS — N2889 Other specified disorders of kidney and ureter: Secondary | ICD-10-CM | POA: Diagnosis not present

## 2020-05-24 DIAGNOSIS — C3490 Malignant neoplasm of unspecified part of unspecified bronchus or lung: Secondary | ICD-10-CM | POA: Diagnosis not present

## 2020-05-24 DIAGNOSIS — R238 Other skin changes: Secondary | ICD-10-CM | POA: Diagnosis not present

## 2020-05-24 MED ORDER — TRIAMCINOLONE ACETONIDE 0.1 % TOPICAL OINTMENT
INTRAMUSCULAR | 1 refills | 0.00000 days | Status: CP
Start: 2020-05-24 — End: ?

## 2020-05-25 DIAGNOSIS — K74 Fibrosis of liver: Principal | ICD-10-CM

## 2020-05-25 DIAGNOSIS — I10 Essential (primary) hypertension: Principal | ICD-10-CM

## 2020-05-25 DIAGNOSIS — L299 Pruritus, unspecified: Principal | ICD-10-CM

## 2020-05-25 MED ORDER — TAMSULOSIN 0.4 MG CAPSULE
ORAL_CAPSULE | 0 refills | 0 days
Start: 2020-05-25 — End: ?

## 2020-05-25 MED ORDER — HYDROXYZINE HCL 25 MG TABLET
ORAL_TABLET | 0 refills | 0 days
Start: 2020-05-25 — End: ?

## 2020-05-25 MED ORDER — LISINOPRIL 10 MG TABLET
ORAL_TABLET | 0 refills | 0 days
Start: 2020-05-25 — End: ?

## 2020-06-07 ENCOUNTER — Ambulatory Visit: Admit: 2020-06-07 | Discharge: 2020-06-08 | Payer: MEDICARE

## 2020-06-07 ENCOUNTER — Ambulatory Visit
Admit: 2020-06-07 | Discharge: 2020-06-08 | Payer: MEDICARE | Attending: Hematology & Oncology | Primary: Hematology & Oncology

## 2020-06-07 DIAGNOSIS — M542 Cervicalgia: Principal | ICD-10-CM

## 2020-06-07 DIAGNOSIS — C329 Malignant neoplasm of larynx, unspecified: Principal | ICD-10-CM

## 2020-06-07 DIAGNOSIS — Z430 Encounter for attention to tracheostomy: Principal | ICD-10-CM

## 2020-06-07 DIAGNOSIS — Z87891 Personal history of nicotine dependence: Secondary | ICD-10-CM | POA: Diagnosis not present

## 2020-06-16 MED ORDER — MIRTAZAPINE 30 MG TABLET
ORAL_TABLET | 0 refills | 0 days | Status: CP
Start: 2020-06-16 — End: ?

## 2020-06-16 MED ORDER — LISINOPRIL 10 MG TABLET
ORAL_TABLET | 0 refills | 0 days
Start: 2020-06-16 — End: ?

## 2020-06-19 DIAGNOSIS — I10 Essential (primary) hypertension: Principal | ICD-10-CM

## 2020-06-19 MED ORDER — LISINOPRIL 10 MG TABLET
ORAL_TABLET | 0 refills | 0 days
Start: 2020-06-19 — End: ?

## 2020-06-21 ENCOUNTER — Telehealth: Admit: 2020-06-21 | Discharge: 2020-06-22 | Payer: MEDICARE

## 2020-06-21 DIAGNOSIS — G4700 Insomnia, unspecified: Principal | ICD-10-CM

## 2020-06-21 DIAGNOSIS — J690 Pneumonitis due to inhalation of food and vomit: Principal | ICD-10-CM

## 2020-06-21 DIAGNOSIS — F32A Depression, unspecified depression type: Principal | ICD-10-CM

## 2020-06-21 DIAGNOSIS — G893 Neoplasm related pain (acute) (chronic): Principal | ICD-10-CM

## 2020-06-21 DIAGNOSIS — C3402 Malignant neoplasm of left main bronchus: Secondary | ICD-10-CM | POA: Diagnosis not present

## 2020-06-21 DIAGNOSIS — R454 Irritability and anger: Secondary | ICD-10-CM | POA: Diagnosis not present

## 2020-06-21 MED ORDER — MORPHINE ER 30 MG TABLET,EXTENDED RELEASE
ORAL_TABLET | 0 refills | 0 days | Status: CP
Start: 2020-06-21 — End: ?

## 2020-06-21 MED ORDER — MIRTAZAPINE 45 MG TABLET
ORAL_TABLET | Freq: Every evening | ORAL | 3 refills | 30 days | Status: CP
Start: 2020-06-21 — End: 2020-10-19

## 2020-06-21 MED ORDER — OXYCODONE 15 MG TABLET
ORAL_TABLET | ORAL | 0 refills | 30 days | Status: CP | PRN
Start: 2020-06-21 — End: 2020-07-21

## 2020-06-21 MED ORDER — AMOXICILLIN 875 MG-POTASSIUM CLAVULANATE 125 MG TABLET
ORAL_TABLET | Freq: Two times a day (BID) | ORAL | 0 refills | 10 days | Status: CP
Start: 2020-06-21 — End: 2020-07-01

## 2020-06-27 DIAGNOSIS — I10 Essential (primary) hypertension: Principal | ICD-10-CM

## 2020-06-27 MED ORDER — LISINOPRIL 10 MG TABLET
ORAL_TABLET | 0 refills | 0 days
Start: 2020-06-27 — End: ?

## 2020-07-07 ENCOUNTER — Institutional Professional Consult (permissible substitution): Admit: 2020-07-07 | Discharge: 2020-07-08 | Payer: MEDICARE

## 2020-07-07 DIAGNOSIS — R221 Localized swelling, mass and lump, neck: Secondary | ICD-10-CM | POA: Diagnosis not present

## 2020-07-07 DIAGNOSIS — Z930 Tracheostomy status: Secondary | ICD-10-CM | POA: Diagnosis not present

## 2020-07-07 DIAGNOSIS — C329 Malignant neoplasm of larynx, unspecified: Secondary | ICD-10-CM | POA: Diagnosis not present

## 2020-07-14 DIAGNOSIS — C329 Malignant neoplasm of larynx, unspecified: Principal | ICD-10-CM

## 2020-07-14 MED ORDER — ESCITALOPRAM 20 MG TABLET
ORAL_TABLET | 0 refills | 0 days
Start: 2020-07-14 — End: ?

## 2020-07-24 DIAGNOSIS — C329 Malignant neoplasm of larynx, unspecified: Principal | ICD-10-CM

## 2020-07-24 MED ORDER — ESCITALOPRAM 20 MG TABLET
ORAL_TABLET | 0 refills | 0 days
Start: 2020-07-24 — End: ?

## 2020-07-31 DIAGNOSIS — L298 Other pruritus: Principal | ICD-10-CM

## 2020-07-31 DIAGNOSIS — G893 Neoplasm related pain (acute) (chronic): Principal | ICD-10-CM

## 2020-07-31 DIAGNOSIS — M62830 Muscle spasm of back: Principal | ICD-10-CM

## 2020-07-31 MED ORDER — MORPHINE ER 30 MG TABLET,EXTENDED RELEASE
ORAL_TABLET | 0 refills | 0 days | Status: CP
Start: 2020-07-31 — End: ?

## 2020-07-31 MED ORDER — PROCHLORPERAZINE MALEATE 10 MG TABLET
ORAL_TABLET | 0 refills | 0 days
Start: 2020-07-31 — End: ?

## 2020-08-01 MED ORDER — PROCHLORPERAZINE MALEATE 10 MG TABLET
ORAL_TABLET | 0 refills | 0 days | Status: CP
Start: 2020-08-01 — End: ?

## 2020-08-02 ENCOUNTER — Telehealth: Admit: 2020-08-02 | Discharge: 2020-08-03 | Payer: MEDICARE

## 2020-08-02 DIAGNOSIS — G893 Neoplasm related pain (acute) (chronic): Secondary | ICD-10-CM | POA: Diagnosis not present

## 2020-08-02 MED ORDER — MIRTAZAPINE 45 MG TABLET
ORAL_TABLET | Freq: Every morning | ORAL | 3 refills | 30 days | Status: CP
Start: 2020-08-02 — End: 2020-11-30

## 2020-08-02 MED ORDER — GABAPENTIN 300 MG CAPSULE
ORAL_CAPSULE | 2 refills | 0 days | Status: CP
Start: 2020-08-02 — End: ?

## 2020-08-02 MED ORDER — OXYCODONE 15 MG TABLET
ORAL_TABLET | ORAL | 0 refills | 30 days | Status: CP | PRN
Start: 2020-08-02 — End: 2020-09-01

## 2020-08-16 ENCOUNTER — Ambulatory Visit: Admit: 2020-08-16 | Discharge: 2020-08-17 | Payer: MEDICARE

## 2020-08-16 ENCOUNTER — Ambulatory Visit
Admit: 2020-08-16 | Discharge: 2020-08-17 | Payer: MEDICARE | Attending: Hematology & Oncology | Primary: Hematology & Oncology

## 2020-08-16 ENCOUNTER — Other Ambulatory Visit: Admit: 2020-08-16 | Discharge: 2020-08-17 | Payer: MEDICARE

## 2020-08-16 DIAGNOSIS — J690 Pneumonitis due to inhalation of food and vomit: Principal | ICD-10-CM

## 2020-08-16 DIAGNOSIS — C7951 Secondary malignant neoplasm of bone: Secondary | ICD-10-CM | POA: Diagnosis not present

## 2020-08-16 DIAGNOSIS — R1310 Dysphagia, unspecified: Secondary | ICD-10-CM | POA: Diagnosis not present

## 2020-08-16 DIAGNOSIS — Z9221 Personal history of antineoplastic chemotherapy: Secondary | ICD-10-CM | POA: Diagnosis not present

## 2020-08-16 DIAGNOSIS — G4700 Insomnia, unspecified: Secondary | ICD-10-CM | POA: Diagnosis not present

## 2020-08-16 DIAGNOSIS — I313 Pericardial effusion (noninflammatory): Secondary | ICD-10-CM | POA: Diagnosis not present

## 2020-08-16 DIAGNOSIS — R918 Other nonspecific abnormal finding of lung field: Secondary | ICD-10-CM | POA: Diagnosis not present

## 2020-08-16 DIAGNOSIS — J984 Other disorders of lung: Secondary | ICD-10-CM | POA: Diagnosis not present

## 2020-08-16 DIAGNOSIS — E871 Hypo-osmolality and hyponatremia: Secondary | ICD-10-CM | POA: Diagnosis not present

## 2020-08-16 DIAGNOSIS — I10 Essential (primary) hypertension: Secondary | ICD-10-CM | POA: Diagnosis not present

## 2020-08-16 DIAGNOSIS — C3432 Malignant neoplasm of lower lobe, left bronchus or lung: Secondary | ICD-10-CM | POA: Diagnosis not present

## 2020-08-16 DIAGNOSIS — R251 Tremor, unspecified: Secondary | ICD-10-CM | POA: Diagnosis not present

## 2020-08-16 DIAGNOSIS — Z930 Tracheostomy status: Secondary | ICD-10-CM | POA: Diagnosis not present

## 2020-08-19 DIAGNOSIS — G4700 Insomnia, unspecified: Principal | ICD-10-CM

## 2020-08-19 DIAGNOSIS — M62830 Muscle spasm of back: Principal | ICD-10-CM

## 2020-08-19 DIAGNOSIS — L298 Other pruritus: Principal | ICD-10-CM

## 2020-08-19 MED ORDER — PROCHLORPERAZINE MALEATE 10 MG TABLET
ORAL_TABLET | Freq: Three times a day (TID) | ORAL | 0 refills | 10 days | PRN
Start: 2020-08-19 — End: 2021-08-19

## 2020-08-19 MED ORDER — TRAZODONE 50 MG TABLET
ORAL_TABLET | 0 refills | 0 days
Start: 2020-08-19 — End: ?

## 2020-08-21 DIAGNOSIS — G4700 Insomnia, unspecified: Principal | ICD-10-CM

## 2020-08-21 MED ORDER — TRAZODONE 50 MG TABLET
ORAL_TABLET | Freq: Every evening | ORAL | 1 refills | 60.00000 days | Status: CP | PRN
Start: 2020-08-21 — End: ?

## 2020-08-22 MED ORDER — PROCHLORPERAZINE MALEATE 10 MG TABLET
ORAL_TABLET | Freq: Three times a day (TID) | ORAL | 0 refills | 10 days | Status: CP | PRN
Start: 2020-08-22 — End: 2021-08-22

## 2020-08-23 DIAGNOSIS — C3402 Malignant neoplasm of left main bronchus: Principal | ICD-10-CM

## 2020-08-23 DIAGNOSIS — G4700 Insomnia, unspecified: Principal | ICD-10-CM

## 2020-08-23 DIAGNOSIS — F32A Depression, unspecified depression type: Principal | ICD-10-CM

## 2020-08-23 MED ORDER — MIRTAZAPINE 7.5 MG TABLET
ORAL_TABLET | 0 refills | 0 days
Start: 2020-08-23 — End: ?

## 2020-09-04 DIAGNOSIS — G893 Neoplasm related pain (acute) (chronic): Principal | ICD-10-CM

## 2020-09-04 MED ORDER — MORPHINE ER 30 MG TABLET,EXTENDED RELEASE
ORAL_TABLET | 0 refills | 0 days | Status: CP
Start: 2020-09-04 — End: ?

## 2020-09-04 MED ORDER — OXYCODONE 15 MG TABLET
ORAL_TABLET | ORAL | 0 refills | 30 days | Status: CP | PRN
Start: 2020-09-04 — End: 2020-10-04

## 2020-09-10 DIAGNOSIS — L298 Other pruritus: Principal | ICD-10-CM

## 2020-09-10 DIAGNOSIS — M62830 Muscle spasm of back: Principal | ICD-10-CM

## 2020-09-10 MED ORDER — PROCHLORPERAZINE MALEATE 10 MG TABLET
ORAL_TABLET | Freq: Three times a day (TID) | ORAL | 1 refills | 10 days | PRN
Start: 2020-09-10 — End: 2021-09-10

## 2020-09-11 MED ORDER — PROCHLORPERAZINE MALEATE 10 MG TABLET
ORAL_TABLET | Freq: Three times a day (TID) | ORAL | 1 refills | 10.00000 days | Status: CP | PRN
Start: 2020-09-11 — End: 2021-09-11

## 2020-09-13 ENCOUNTER — Telehealth: Admit: 2020-09-13 | Discharge: 2020-09-14 | Payer: MEDICARE

## 2020-09-13 DIAGNOSIS — F32A Depression, unspecified depression type: Principal | ICD-10-CM

## 2020-09-13 DIAGNOSIS — G893 Neoplasm related pain (acute) (chronic): Principal | ICD-10-CM

## 2020-09-13 DIAGNOSIS — C3402 Malignant neoplasm of left main bronchus: Secondary | ICD-10-CM | POA: Diagnosis not present

## 2020-09-13 DIAGNOSIS — G4700 Insomnia, unspecified: Secondary | ICD-10-CM | POA: Diagnosis not present

## 2020-09-22 DIAGNOSIS — G4700 Insomnia, unspecified: Principal | ICD-10-CM

## 2020-09-22 DIAGNOSIS — F32A Depression, unspecified depression type: Principal | ICD-10-CM

## 2020-09-22 DIAGNOSIS — C3402 Malignant neoplasm of left main bronchus: Principal | ICD-10-CM

## 2020-09-22 MED ORDER — MIRTAZAPINE 7.5 MG TABLET
ORAL_TABLET | 0 refills | 0 days
Start: 2020-09-22 — End: ?

## 2020-09-24 MED ORDER — MIRTAZAPINE 7.5 MG TABLET
ORAL_TABLET | 0 refills | 0 days
Start: 2020-09-24 — End: ?

## 2020-10-04 ENCOUNTER — Ambulatory Visit: Admit: 2020-10-04 | Discharge: 2020-10-05 | Payer: MEDICARE

## 2020-10-04 DIAGNOSIS — Z930 Tracheostomy status: Principal | ICD-10-CM

## 2020-10-04 DIAGNOSIS — C3490 Malignant neoplasm of unspecified part of unspecified bronchus or lung: Principal | ICD-10-CM

## 2020-10-04 DIAGNOSIS — Z430 Encounter for attention to tracheostomy: Principal | ICD-10-CM

## 2020-10-04 DIAGNOSIS — C329 Malignant neoplasm of larynx, unspecified: Principal | ICD-10-CM

## 2020-10-04 DIAGNOSIS — C321 Malignant neoplasm of supraglottis: Secondary | ICD-10-CM | POA: Diagnosis not present

## 2020-10-04 DIAGNOSIS — C7951 Secondary malignant neoplasm of bone: Secondary | ICD-10-CM | POA: Diagnosis not present

## 2020-10-04 DIAGNOSIS — Z9221 Personal history of antineoplastic chemotherapy: Secondary | ICD-10-CM | POA: Diagnosis not present

## 2020-10-04 DIAGNOSIS — Z87891 Personal history of nicotine dependence: Secondary | ICD-10-CM | POA: Diagnosis not present

## 2020-10-05 DIAGNOSIS — G893 Neoplasm related pain (acute) (chronic): Principal | ICD-10-CM

## 2020-10-05 MED ORDER — MORPHINE ER 30 MG TABLET,EXTENDED RELEASE
ORAL_TABLET | 0 refills | 0 days | Status: CP
Start: 2020-10-05 — End: ?

## 2020-10-05 MED ORDER — OXYCODONE 15 MG TABLET
ORAL_TABLET | ORAL | 0 refills | 30 days | Status: CP | PRN
Start: 2020-10-05 — End: 2020-11-04

## 2020-10-12 DIAGNOSIS — Z930 Tracheostomy status: Principal | ICD-10-CM

## 2020-10-12 DIAGNOSIS — C329 Malignant neoplasm of larynx, unspecified: Principal | ICD-10-CM

## 2020-10-12 DIAGNOSIS — Z430 Encounter for attention to tracheostomy: Principal | ICD-10-CM

## 2020-10-16 DIAGNOSIS — M62830 Muscle spasm of back: Principal | ICD-10-CM

## 2020-10-16 DIAGNOSIS — L298 Other pruritus: Principal | ICD-10-CM

## 2020-10-16 MED ORDER — PROCHLORPERAZINE MALEATE 10 MG TABLET
ORAL_TABLET | 0 refills | 0 days
Start: 2020-10-16 — End: ?

## 2020-10-18 MED ORDER — PROCHLORPERAZINE MALEATE 10 MG TABLET
ORAL_TABLET | 0 refills | 0 days | Status: CP
Start: 2020-10-18 — End: ?

## 2020-10-20 ENCOUNTER — Ambulatory Visit: Admit: 2020-10-20 | Discharge: 2020-10-24 | Payer: MEDICARE

## 2020-10-30 ENCOUNTER — Emergency Department: Payer: Medicare HMO

## 2020-10-30 ENCOUNTER — Other Ambulatory Visit: Payer: Self-pay

## 2020-10-30 ENCOUNTER — Inpatient Hospital Stay
Admission: EM | Admit: 2020-10-30 | Discharge: 2020-11-15 | DRG: 870 | Disposition: A | Discharge status: Home Health | Payer: Medicare HMO | Attending: Internal Medicine | Admitting: Internal Medicine

## 2020-10-30 ENCOUNTER — Encounter: Payer: Self-pay | Admitting: Radiology

## 2020-10-30 DIAGNOSIS — E43 Unspecified severe protein-calorie malnutrition: Secondary | ICD-10-CM | POA: Diagnosis not present

## 2020-10-30 DIAGNOSIS — N179 Acute kidney failure, unspecified: Secondary | ICD-10-CM

## 2020-10-30 DIAGNOSIS — J9601 Acute respiratory failure with hypoxia: Secondary | ICD-10-CM | POA: Diagnosis not present

## 2020-10-30 DIAGNOSIS — Z4682 Encounter for fitting and adjustment of non-vascular catheter: Secondary | ICD-10-CM | POA: Diagnosis not present

## 2020-10-30 DIAGNOSIS — K7200 Acute and subacute hepatic failure without coma: Secondary | ICD-10-CM | POA: Diagnosis not present

## 2020-10-30 DIAGNOSIS — Y842 Radiological procedure and radiotherapy as the cause of abnormal reaction of the patient, or of later complication, without mention of misadventure at the time of the procedure: Secondary | ICD-10-CM | POA: Diagnosis present

## 2020-10-30 DIAGNOSIS — G928 Other toxic encephalopathy: Secondary | ICD-10-CM | POA: Diagnosis present

## 2020-10-30 DIAGNOSIS — B182 Chronic viral hepatitis C: Secondary | ICD-10-CM | POA: Diagnosis present

## 2020-10-30 DIAGNOSIS — R471 Dysarthria and anarthria: Secondary | ICD-10-CM | POA: Diagnosis present

## 2020-10-30 DIAGNOSIS — Z79891 Long term (current) use of opiate analgesic: Secondary | ICD-10-CM

## 2020-10-30 DIAGNOSIS — Z8521 Personal history of malignant neoplasm of larynx: Secondary | ICD-10-CM

## 2020-10-30 DIAGNOSIS — R4182 Altered mental status, unspecified: Secondary | ICD-10-CM | POA: Diagnosis present

## 2020-10-30 DIAGNOSIS — I214 Non-ST elevation (NSTEMI) myocardial infarction: Secondary | ICD-10-CM | POA: Diagnosis not present

## 2020-10-30 DIAGNOSIS — E871 Hypo-osmolality and hyponatremia: Secondary | ICD-10-CM | POA: Diagnosis present

## 2020-10-30 DIAGNOSIS — Z8673 Personal history of transient ischemic attack (TIA), and cerebral infarction without residual deficits: Secondary | ICD-10-CM

## 2020-10-30 DIAGNOSIS — I2510 Atherosclerotic heart disease of native coronary artery without angina pectoris: Secondary | ICD-10-CM | POA: Diagnosis present

## 2020-10-30 DIAGNOSIS — C7951 Secondary malignant neoplasm of bone: Secondary | ICD-10-CM | POA: Diagnosis present

## 2020-10-30 DIAGNOSIS — L8990 Pressure ulcer of unspecified site, unspecified stage: Secondary | ICD-10-CM | POA: Insufficient documentation

## 2020-10-30 DIAGNOSIS — Z515 Encounter for palliative care: Secondary | ICD-10-CM | POA: Diagnosis not present

## 2020-10-30 DIAGNOSIS — R778 Other specified abnormalities of plasma proteins: Secondary | ICD-10-CM

## 2020-10-30 DIAGNOSIS — T20012A Burn of unspecified degree of left ear [any part, except ear drum], initial encounter: Secondary | ICD-10-CM | POA: Diagnosis present

## 2020-10-30 DIAGNOSIS — R6521 Severe sepsis with septic shock: Secondary | ICD-10-CM | POA: Diagnosis present

## 2020-10-30 DIAGNOSIS — N200 Calculus of kidney: Secondary | ICD-10-CM | POA: Diagnosis not present

## 2020-10-30 DIAGNOSIS — C329 Malignant neoplasm of larynx, unspecified: Secondary | ICD-10-CM | POA: Diagnosis not present

## 2020-10-30 DIAGNOSIS — R0902 Hypoxemia: Secondary | ICD-10-CM | POA: Diagnosis not present

## 2020-10-30 DIAGNOSIS — F1021 Alcohol dependence, in remission: Secondary | ICD-10-CM | POA: Diagnosis present

## 2020-10-30 DIAGNOSIS — B179 Acute viral hepatitis, unspecified: Secondary | ICD-10-CM

## 2020-10-30 DIAGNOSIS — Z4659 Encounter for fitting and adjustment of other gastrointestinal appliance and device: Secondary | ICD-10-CM

## 2020-10-30 DIAGNOSIS — F1190 Opioid use, unspecified, uncomplicated: Secondary | ICD-10-CM

## 2020-10-30 DIAGNOSIS — M6282 Rhabdomyolysis: Secondary | ICD-10-CM | POA: Diagnosis present

## 2020-10-30 DIAGNOSIS — I255 Ischemic cardiomyopathy: Secondary | ICD-10-CM | POA: Diagnosis present

## 2020-10-30 DIAGNOSIS — K8020 Calculus of gallbladder without cholecystitis without obstruction: Secondary | ICD-10-CM

## 2020-10-30 DIAGNOSIS — N170 Acute kidney failure with tubular necrosis: Secondary | ICD-10-CM | POA: Diagnosis not present

## 2020-10-30 DIAGNOSIS — A4102 Sepsis due to Methicillin resistant Staphylococcus aureus: Secondary | ICD-10-CM | POA: Diagnosis present

## 2020-10-30 DIAGNOSIS — I6381 Other cerebral infarction due to occlusion or stenosis of small artery: Secondary | ICD-10-CM | POA: Diagnosis present

## 2020-10-30 DIAGNOSIS — Z0189 Encounter for other specified special examinations: Secondary | ICD-10-CM

## 2020-10-30 DIAGNOSIS — Z789 Other specified health status: Secondary | ICD-10-CM | POA: Diagnosis not present

## 2020-10-30 DIAGNOSIS — E870 Hyperosmolality and hypernatremia: Secondary | ICD-10-CM | POA: Diagnosis not present

## 2020-10-30 DIAGNOSIS — R1310 Dysphagia, unspecified: Secondary | ICD-10-CM | POA: Diagnosis not present

## 2020-10-30 DIAGNOSIS — R29720 NIHSS score 20: Secondary | ICD-10-CM | POA: Diagnosis present

## 2020-10-30 DIAGNOSIS — J15211 Pneumonia due to Methicillin susceptible Staphylococcus aureus: Secondary | ICD-10-CM | POA: Diagnosis present

## 2020-10-30 DIAGNOSIS — J9622 Acute and chronic respiratory failure with hypercapnia: Secondary | ICD-10-CM | POA: Diagnosis present

## 2020-10-30 DIAGNOSIS — E669 Obesity, unspecified: Secondary | ICD-10-CM | POA: Diagnosis present

## 2020-10-30 DIAGNOSIS — C3490 Malignant neoplasm of unspecified part of unspecified bronchus or lung: Secondary | ICD-10-CM | POA: Diagnosis not present

## 2020-10-30 DIAGNOSIS — F32A Depression, unspecified: Secondary | ICD-10-CM | POA: Diagnosis present

## 2020-10-30 DIAGNOSIS — I639 Cerebral infarction, unspecified: Secondary | ICD-10-CM | POA: Diagnosis not present

## 2020-10-30 DIAGNOSIS — Z20822 Contact with and (suspected) exposure to covid-19: Secondary | ICD-10-CM | POA: Diagnosis present

## 2020-10-30 DIAGNOSIS — E876 Hypokalemia: Secondary | ICD-10-CM | POA: Diagnosis not present

## 2020-10-30 DIAGNOSIS — N184 Chronic kidney disease, stage 4 (severe): Secondary | ICD-10-CM | POA: Diagnosis not present

## 2020-10-30 DIAGNOSIS — K5730 Diverticulosis of large intestine without perforation or abscess without bleeding: Secondary | ICD-10-CM | POA: Diagnosis not present

## 2020-10-30 DIAGNOSIS — J9690 Respiratory failure, unspecified, unspecified whether with hypoxia or hypercapnia: Secondary | ICD-10-CM | POA: Diagnosis not present

## 2020-10-30 DIAGNOSIS — R404 Transient alteration of awareness: Secondary | ICD-10-CM | POA: Diagnosis not present

## 2020-10-30 DIAGNOSIS — I10 Essential (primary) hypertension: Secondary | ICD-10-CM | POA: Diagnosis present

## 2020-10-30 DIAGNOSIS — K222 Esophageal obstruction: Secondary | ICD-10-CM | POA: Diagnosis present

## 2020-10-30 DIAGNOSIS — R4020 Unspecified coma: Secondary | ICD-10-CM | POA: Diagnosis not present

## 2020-10-30 DIAGNOSIS — Z930 Tracheostomy status: Secondary | ICD-10-CM | POA: Diagnosis not present

## 2020-10-30 DIAGNOSIS — E875 Hyperkalemia: Secondary | ICD-10-CM | POA: Diagnosis present

## 2020-10-30 DIAGNOSIS — J9621 Acute and chronic respiratory failure with hypoxia: Secondary | ICD-10-CM | POA: Diagnosis present

## 2020-10-30 DIAGNOSIS — G9341 Metabolic encephalopathy: Secondary | ICD-10-CM

## 2020-10-30 DIAGNOSIS — K429 Umbilical hernia without obstruction or gangrene: Secondary | ICD-10-CM | POA: Diagnosis not present

## 2020-10-30 DIAGNOSIS — R000 Tachycardia, unspecified: Secondary | ICD-10-CM | POA: Diagnosis not present

## 2020-10-30 DIAGNOSIS — Z79899 Other long term (current) drug therapy: Secondary | ICD-10-CM

## 2020-10-30 DIAGNOSIS — F419 Anxiety disorder, unspecified: Secondary | ICD-10-CM | POA: Diagnosis present

## 2020-10-30 DIAGNOSIS — R410 Disorientation, unspecified: Secondary | ICD-10-CM | POA: Diagnosis not present

## 2020-10-30 DIAGNOSIS — J9600 Acute respiratory failure, unspecified whether with hypoxia or hypercapnia: Secondary | ICD-10-CM

## 2020-10-30 DIAGNOSIS — L8991 Pressure ulcer of unspecified site, stage 1: Secondary | ICD-10-CM | POA: Diagnosis present

## 2020-10-30 DIAGNOSIS — Z6828 Body mass index (BMI) 28.0-28.9, adult: Secondary | ICD-10-CM

## 2020-10-30 DIAGNOSIS — Z87891 Personal history of nicotine dependence: Secondary | ICD-10-CM

## 2020-10-30 LAB — COMPREHENSIVE METABOLIC PANEL
ALT: 2441 U/L — ABNORMAL HIGH (ref 0–44)
AST: 7291 U/L — ABNORMAL HIGH (ref 15–41)
Albumin: 2.9 g/dL — ABNORMAL LOW (ref 3.5–5.0)
Alkaline Phosphatase: 91 U/L (ref 38–126)
Anion gap: 13 (ref 5–15)
BUN: 34 mg/dL — ABNORMAL HIGH (ref 8–23)
CO2: 26 mmol/L (ref 22–32)
Calcium: 8.5 mg/dL — ABNORMAL LOW (ref 8.9–10.3)
Chloride: 90 mmol/L — ABNORMAL LOW (ref 98–111)
Creatinine, Ser: 3.64 mg/dL — ABNORMAL HIGH (ref 0.61–1.24)
GFR, Estimated: 18 mL/min — ABNORMAL LOW (ref >60)
Glucose, Bld: 106 mg/dL — ABNORMAL HIGH (ref 70–99)
Potassium: 6.3 mmol/L (ref 3.5–5.1)
Sodium: 129 mmol/L — ABNORMAL LOW (ref 135–145)
Total Bilirubin: 1.4 mg/dL — ABNORMAL HIGH (ref 0.3–1.2)
Total Protein: 8 g/dL (ref 6.5–8.1)

## 2020-10-30 LAB — TROPONIN I (HIGH SENSITIVITY): Troponin I (High Sensitivity): 17932 ng/L (ref <18)

## 2020-10-30 LAB — CBC
HCT: 39.6 % (ref 39.0–52.0)
Hemoglobin: 13.7 g/dL (ref 13.0–17.0)
MCH: 30.7 pg (ref 26.0–34.0)
MCHC: 34.6 g/dL (ref 30.0–36.0)
MCV: 88.8 fL (ref 80.0–100.0)
Platelets: 165 10*3/uL (ref 150–400)
RBC: 4.46 MIL/uL (ref 4.22–5.81)
RDW: 14.2 % (ref 11.5–15.5)
WBC: 9.9 10*3/uL (ref 4.0–10.5)
nRBC: 0 % (ref 0.0–0.2)

## 2020-10-30 LAB — ETHANOL: Alcohol, Ethyl (B): <10 mg/dL (ref <10)

## 2020-10-30 LAB — RESP PANEL BY RT-PCR (FLU A&B, COVID) ARPGX2
Influenza A by PCR: NEGATIVE
Influenza B by PCR: NEGATIVE
SARS Coronavirus 2 by RT PCR: NEGATIVE

## 2020-10-30 IMAGING — DX DG CHEST 1V PORT
1 series · 1 of 1 positions shown · non-contrast
Comparison: None.

CLINICAL DATA: Hypoxia

EXAM:
PORTABLE CHEST 1 VIEW

[chest ap]
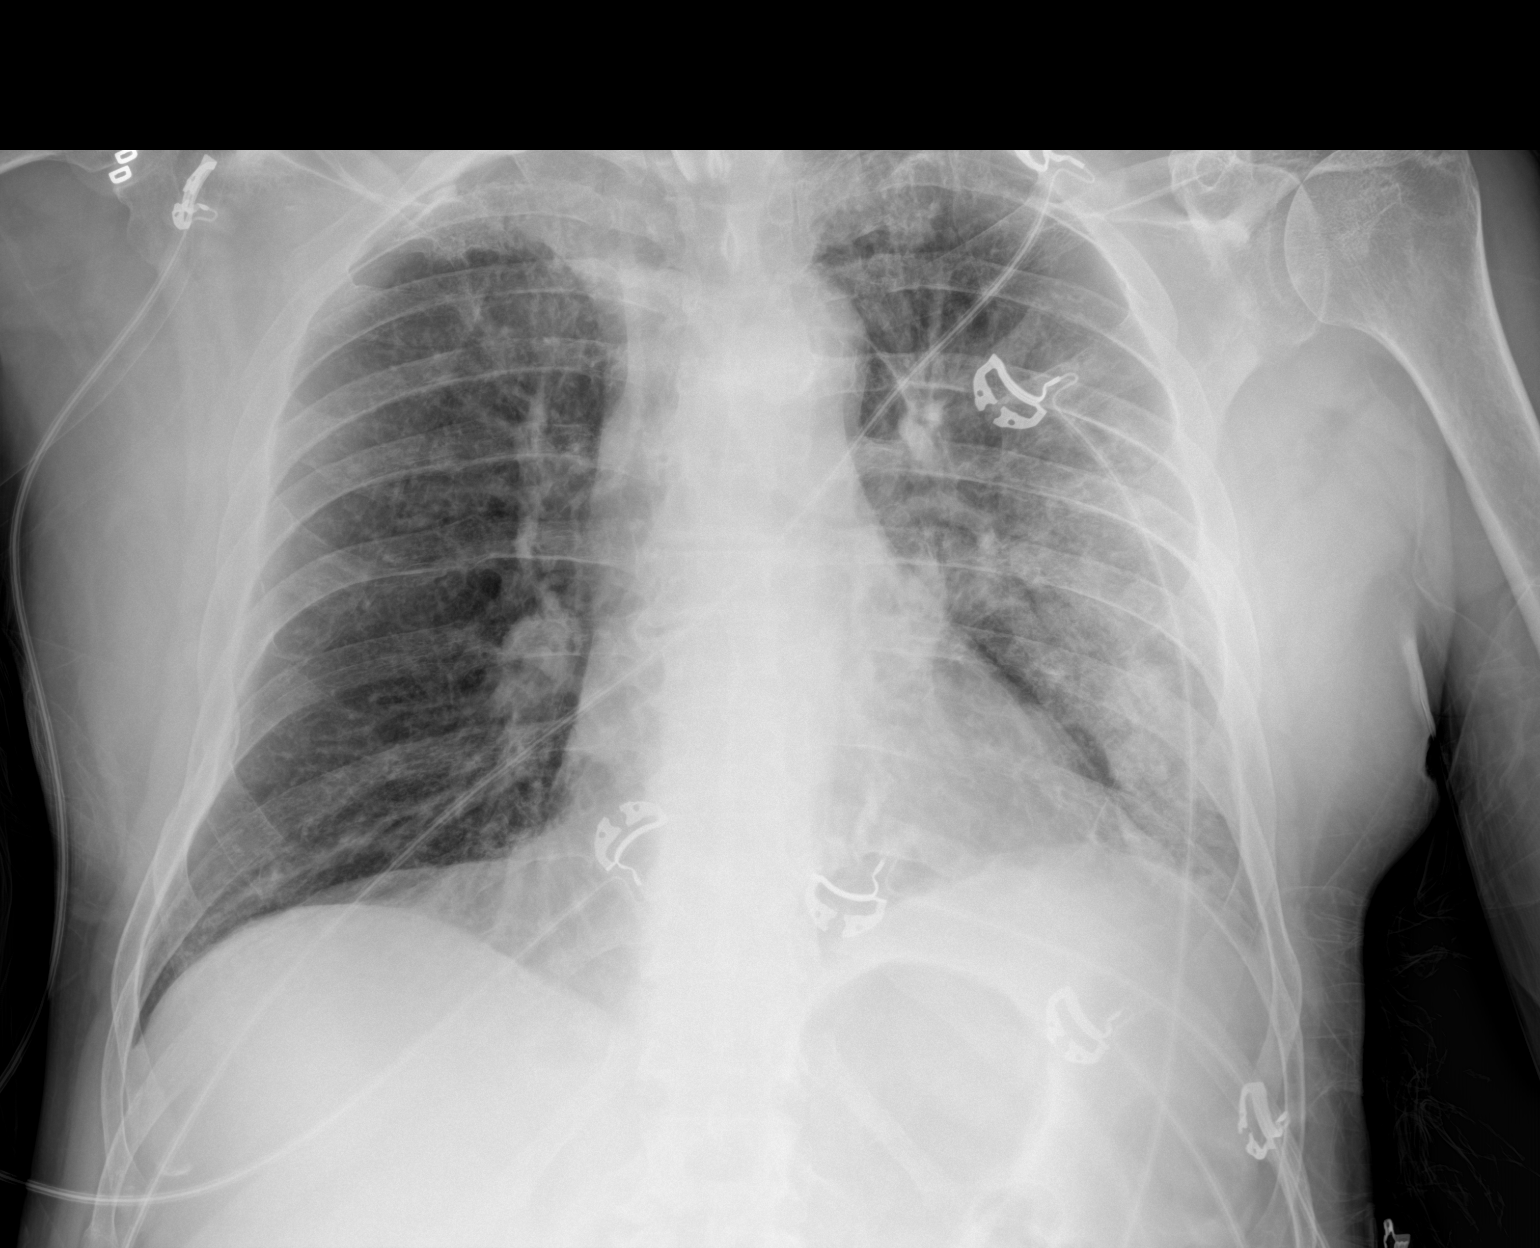

[1 of 1 positions shown; findings below may reference images not displayed]

FINDINGS: Tracheostomy tube tip about 5.8 cm superior to carina. The right
lung shows no focal airspace disease. The left lung shows mild
diffuse ground-glass opacity. No pleural effusion. Normal cardiac
size. No pneumothorax.
IMPRESSION: Diffuse hazy opacity in the left thorax which may be secondary to
pneumonia

## 2020-10-30 IMAGING — CT CT ABD-PELV W/O CM
2 of 4 series · 16 of 46 positions shown, 18 images · non-contrast
Comparison: None.

CLINICAL DATA: Abdominal pain

EXAM:
CT ABDOMEN AND PELVIS WITHOUT CONTRAST
TECHNIQUE: Multidetector CT imaging of the abdomen and pelvis was performed
following the standard protocol without IV contrast.

[Series 2: routine abd/pel wo · axial · 0.78mm/px · z∈[-488,-83]mm · 13 of 91 slices shown, 15 images]
[im 5/91  soft-tissue]
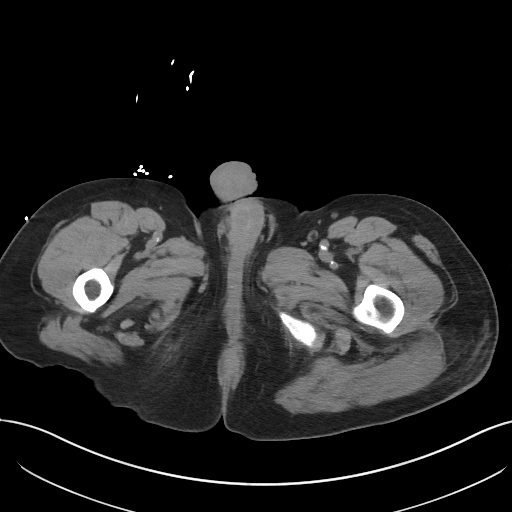
[im 5/91  bone]
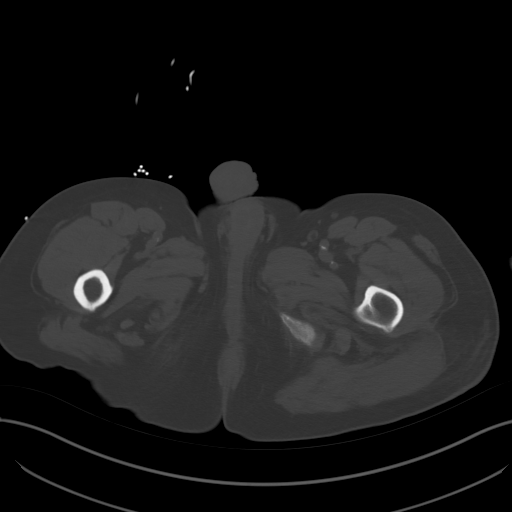
[im 13/91  soft-tissue]
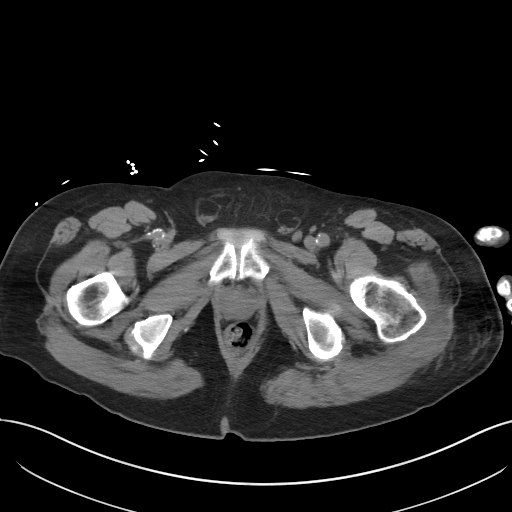
[im 21/91  soft-tissue]
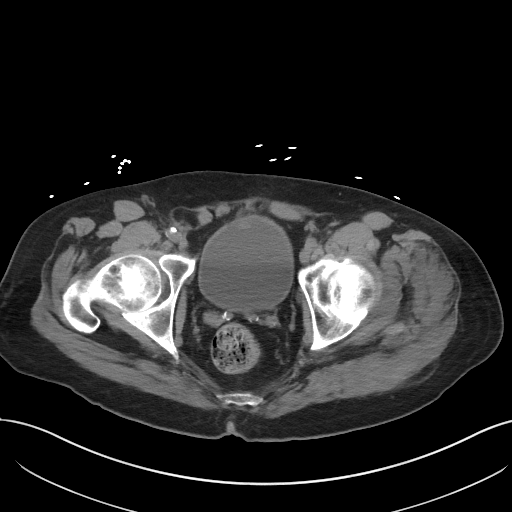
[im 25/91  soft-tissue]
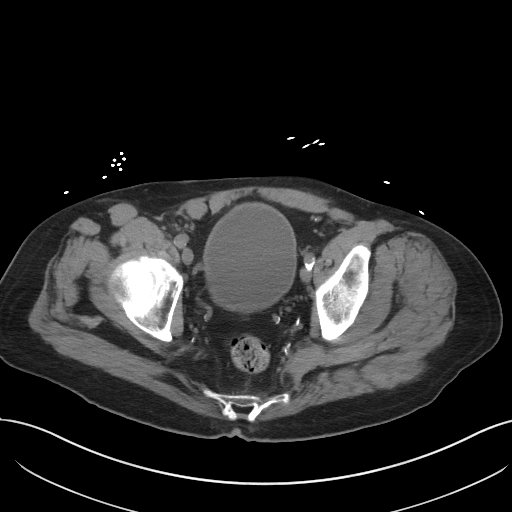
[im 33/91  soft-tissue]
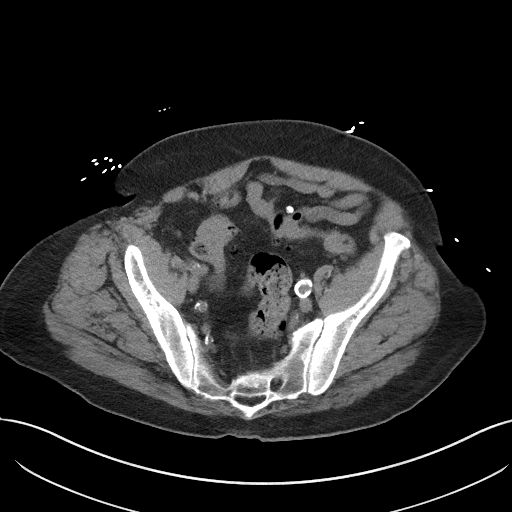
[im 37/91  soft-tissue]
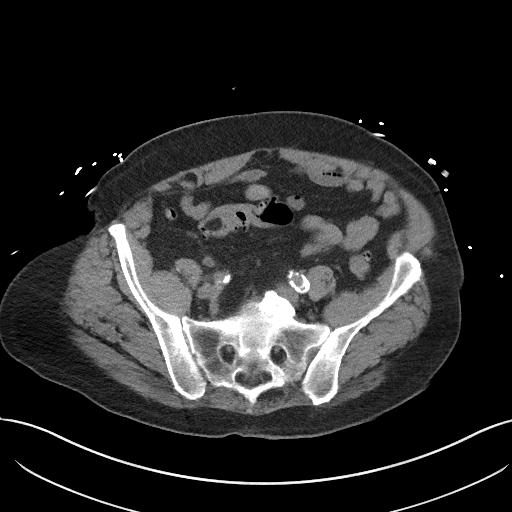
[im 46/91  soft-tissue]
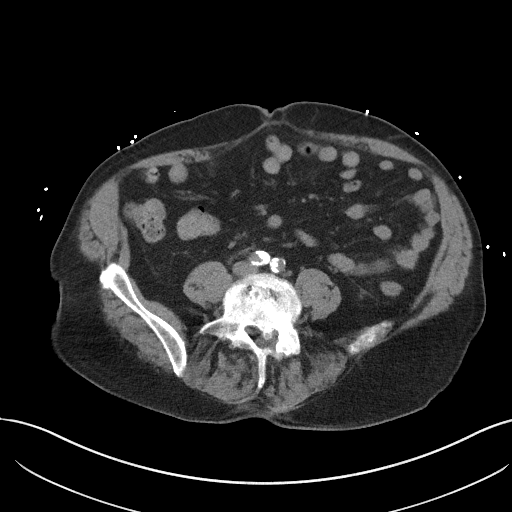
[im 54/91  soft-tissue]
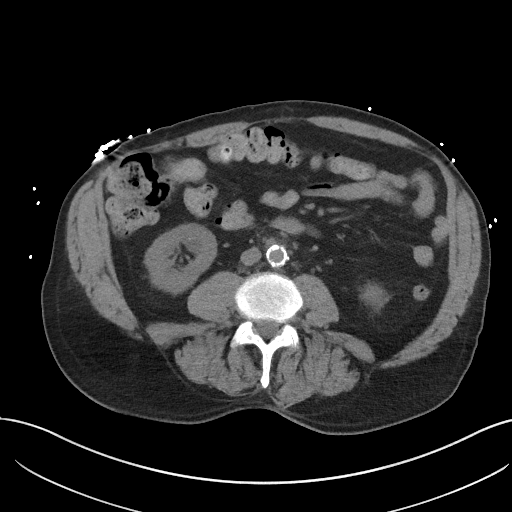
[im 58/91  soft-tissue]
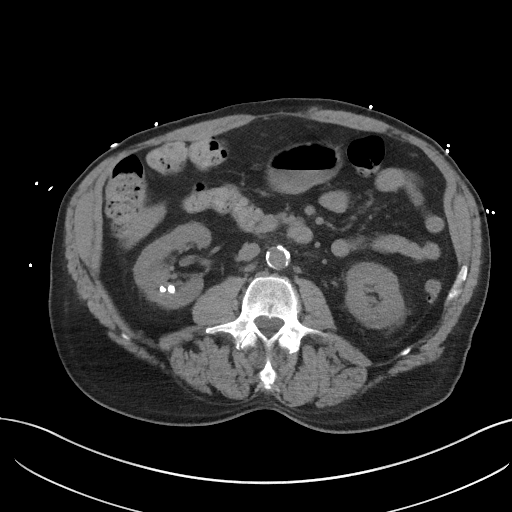
[im 58/91  bone]
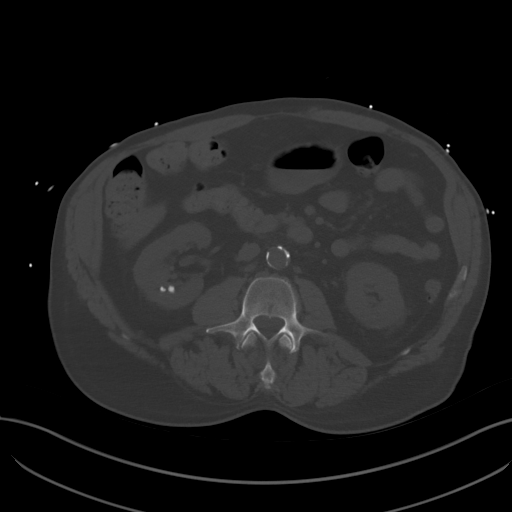
[im 66/91  soft-tissue]
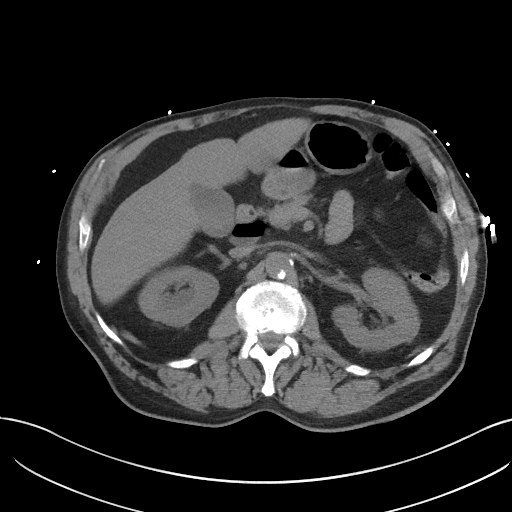
[im 70/91  soft-tissue]
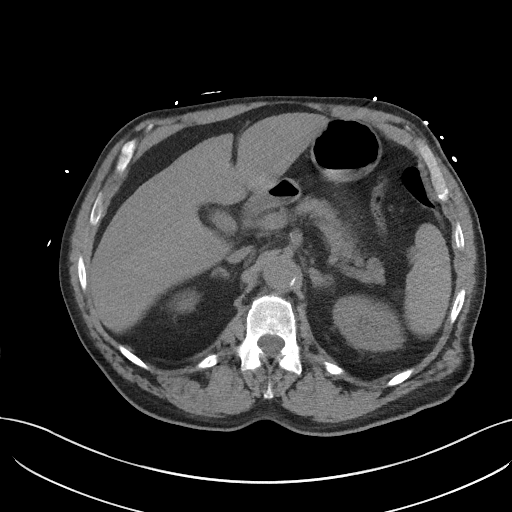
[im 78/91  soft-tissue]
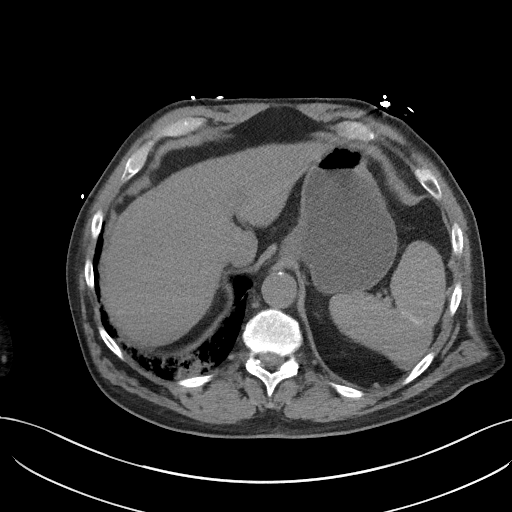
[im 86/91  soft-tissue]
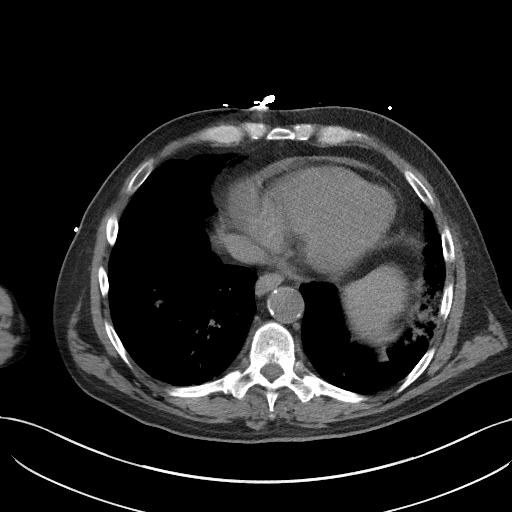

[Series 5: coronal st · coronal · 0.73mm/px · 3 of 92 slices shown]
[im 31/92  soft-tissue]
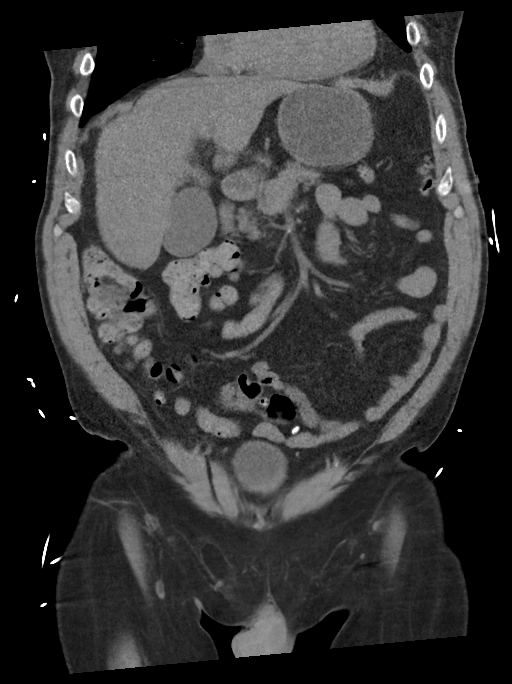
[im 41/92  soft-tissue]
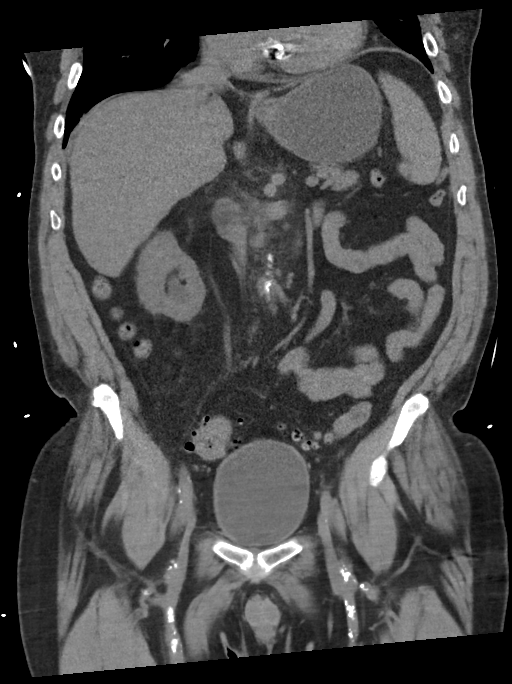
[im 51/92  soft-tissue]
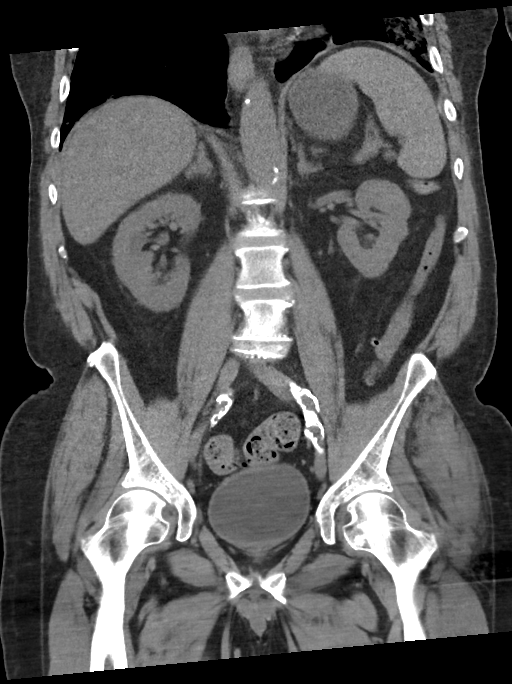

[16 of 46 positions shown; findings below may reference images not displayed]

FINDINGS: Lower chest: Patchy opacities in the bilateral lower lungs,
suspicious for pneumonia.

Hepatobiliary: Liver is within normal limits.

Layering noncalcified gallstones versus gallbladder sludge (series
2/image 28), without associated inflammatory changes. No
intrahepatic or extrahepatic duct dilatation.

Pancreas: Within normal limits.

Spleen: Within normal limits.

Adrenals/Urinary Tract: Adrenal glands are within normal limits.

Multiple nonobstructing right lower pole renal calculi measuring up
to 7 mm (series 2/image 34). Left kidney is within normal limits. No
hydronephrosis.

Mildly thick-walled bladder.

Stomach/Bowel: Stomach is within normal limits.

No evidence of bowel obstruction.

Normal appendix (series 2/image 56).

Sigmoid diverticulosis, without evidence of diverticulitis.

Vascular/Lymphatic: No evidence of abdominal aortic aneurysm.

Atherosclerotic calcifications of the abdominal aorta and branch
vessels.

No suspicious abdominopelvic lymphadenopathy.

Reproductive: Prostate is unremarkable.

Other: No abdominopelvic ascites.

Tiny fat containing periumbilical hernia (series 2/image 48). Mild
fat in the bilateral inguinal canals (series 2/image 76).

Musculoskeletal: Degenerative changes of the visualized
thoracolumbar spine.
IMPRESSION: No evidence of bowel obstruction.  Normal appendix.

Sigmoid diverticulosis, without evidence of diverticulitis.

Multiple nonobstructing right lower pole renal calculi measuring up
to 7 mm. No hydronephrosis.

Cholelithiasis, without associated inflammatory changes.

## 2020-10-30 IMAGING — CT CT HEAD W/O CM
4 series · 16 of 47 positions shown, 18 images · non-contrast
Comparison: None.

CLINICAL DATA: Mental status change, unknown cause

EXAM:
CT HEAD WITHOUT CONTRAST
TECHNIQUE: Contiguous axial images were obtained from the base of the skull
through the vertex without intravenous contrast.

[Series 2: head bone · axial · 0.40mm/px · z∈[-157,-125]mm · 3 of 80 slices shown]
[im 8/80  bone]
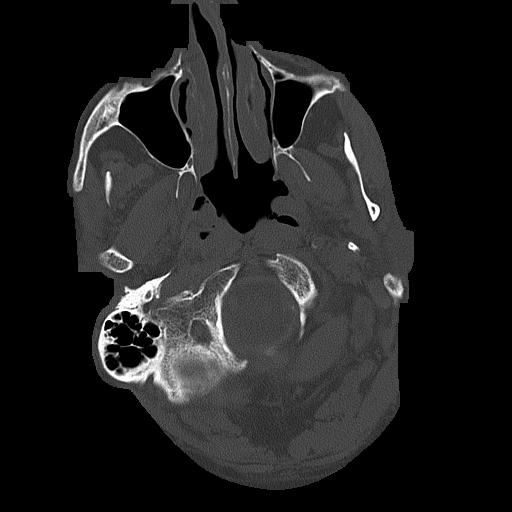
[im 16/80  bone]
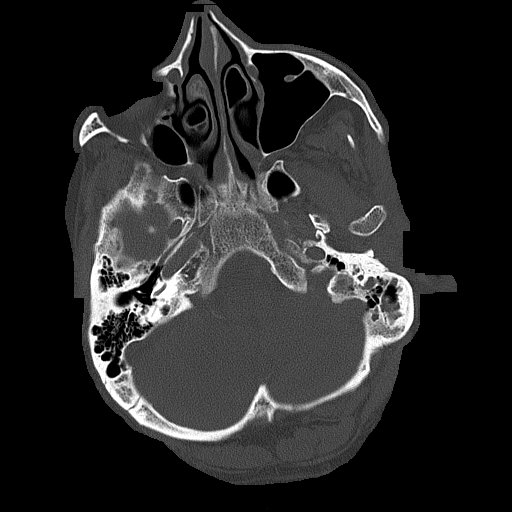
[im 24/80  bone]
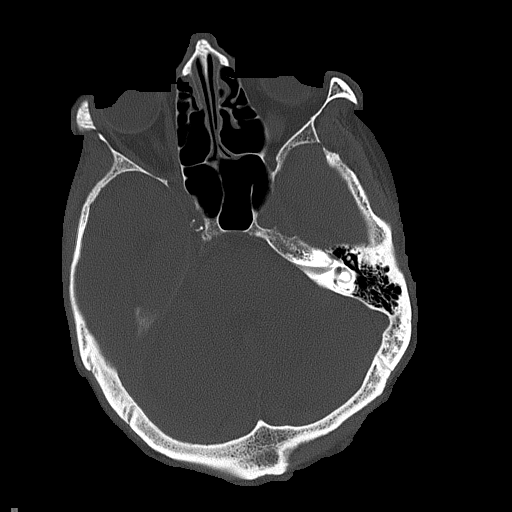

[Series 3: head wo · axial · 0.40mm/px · z∈[-156,-36]mm · 7 of 32 slices shown, 9 images]
[im 4/32  brain]
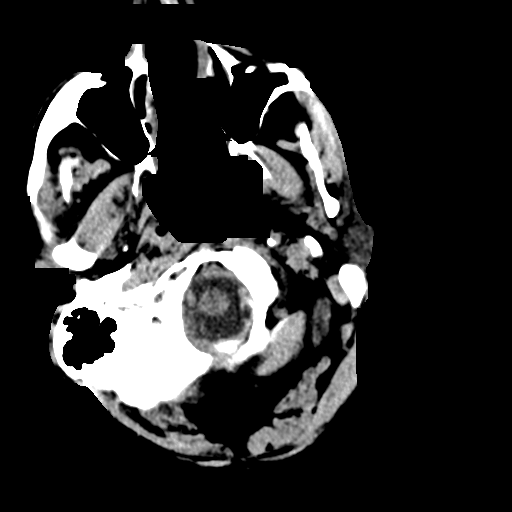
[im 4/32  bone]
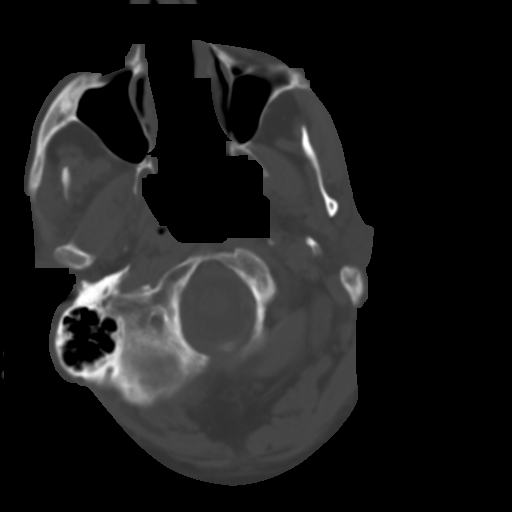
[im 8/32  brain]
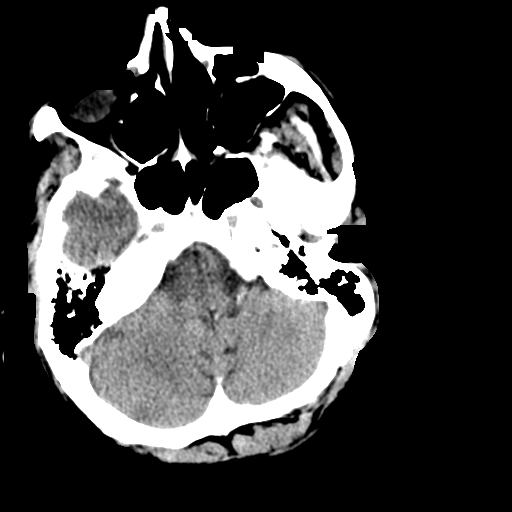
[im 12/32  brain]
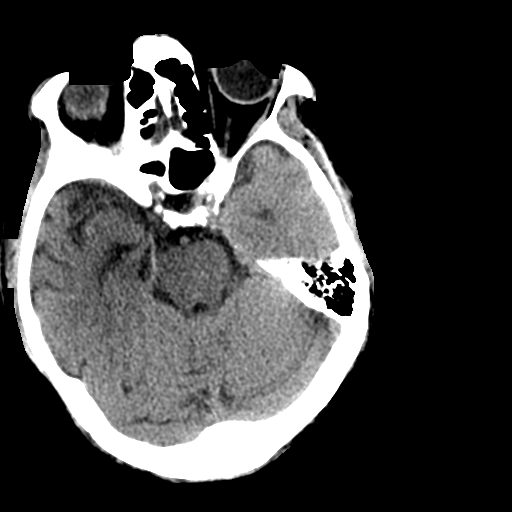
[im 16/32  brain]
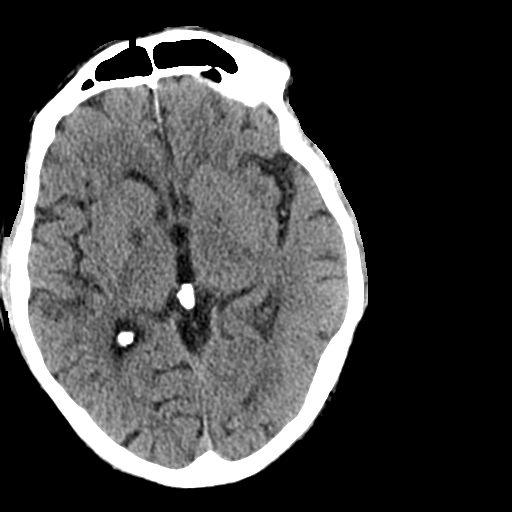
[im 20/32  brain]
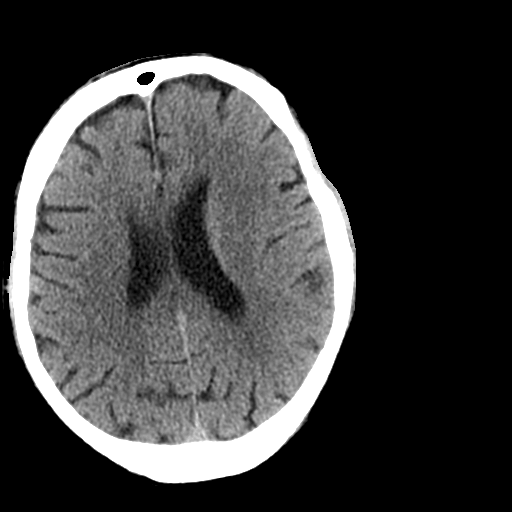
[im 20/32  bone]
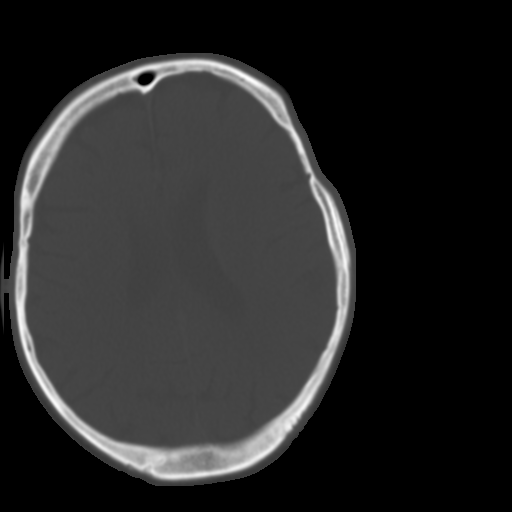
[im 24/32  brain]
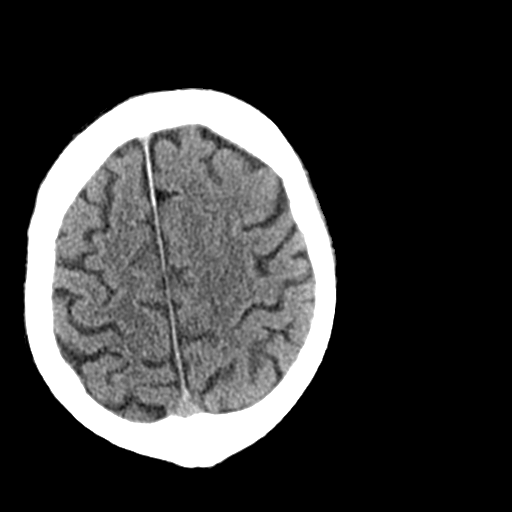
[im 28/32  brain]
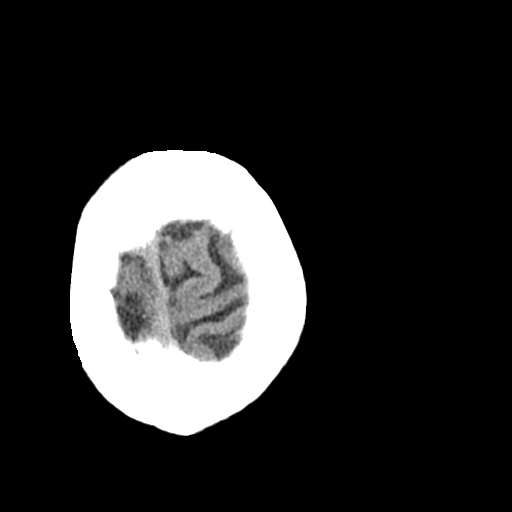

[Series 4: coronal soft tissue · coronal · 0.31mm/px · 3 of 71 slices shown]
[im 24/71  brain]
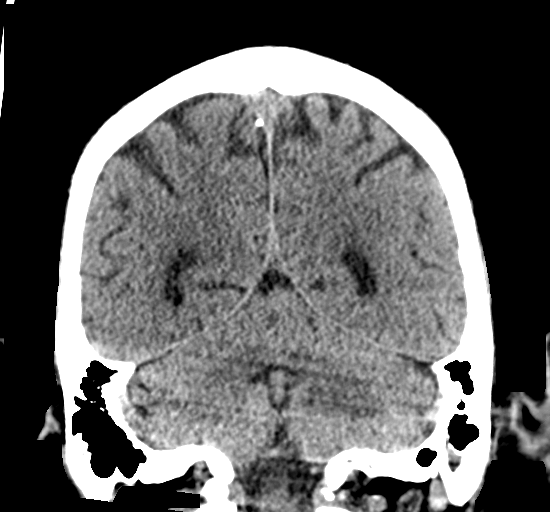
[im 32/71  brain]
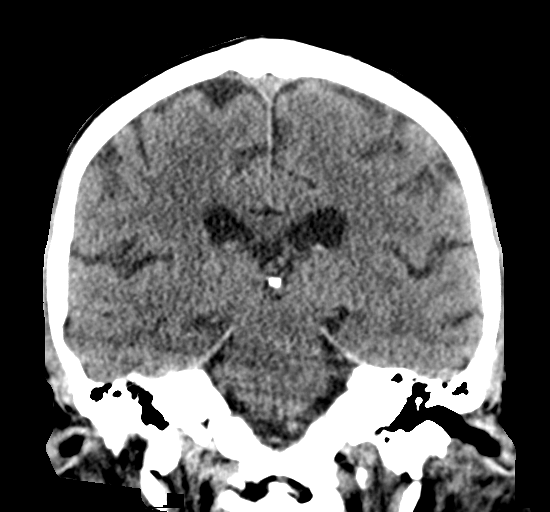
[im 39/71  brain]
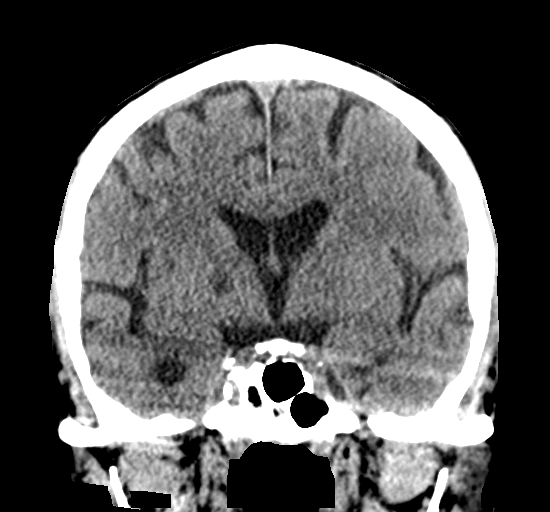

[Series 5: sagittal soft tissue · sagittal · 0.31mm/px · 3 of 64 slices shown]
[im 25/64  brain]
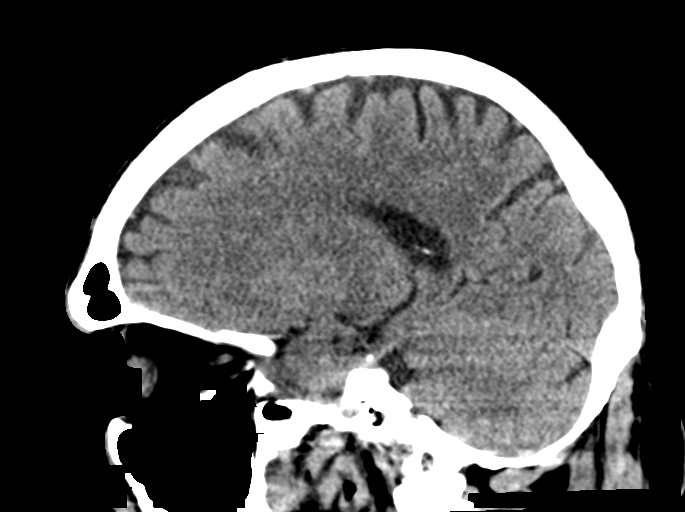
[im 32/64  brain]
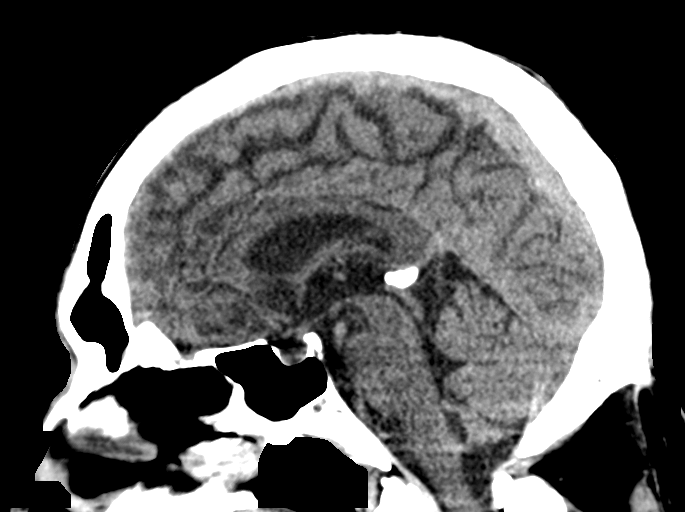
[im 39/64  brain]
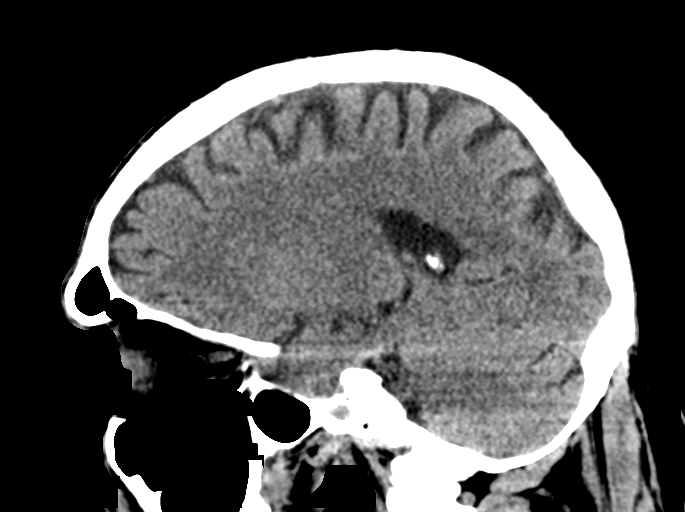

[16 of 47 positions shown; findings below may reference images not displayed]

FINDINGS: Brain: Low-density areas in the basal ganglia bilaterally compatible
with acute infarcts, age indeterminate, favor chronic. No hemorrhage
or hydrocephalus.

Vascular: No hyperdense vessel or unexpected calcification.

Skull: No acute calvarial abnormality.

Sinuses/Orbits: No acute findings

Other: None
IMPRESSION: Bilateral basal ganglia lacunar infarcts, age indeterminate but
favor chronic.

## 2020-10-30 MED ORDER — INSULIN ASPART 100 UNIT/ML IJ SOLN
10.0000 [IU] | Freq: Once | INTRAMUSCULAR | Status: AC
  Administered 2020-10-30: 10 [IU] via INTRAVENOUS
  Filled 2020-10-30: qty 1

## 2020-10-30 MED ORDER — PATIROMER SORBITEX CALCIUM 8.4 G PO PACK
16.8000 g | PACK | Freq: Every day | ORAL | Status: DC
  Filled 2020-10-30: qty 2

## 2020-10-30 MED ORDER — DEXTROSE 50 % IV SOLN
25.0000 g | Freq: Once | INTRAVENOUS | Status: AC
  Administered 2020-10-30: 25 g via INTRAVENOUS
  Filled 2020-10-30: qty 50

## 2020-10-30 MED ORDER — CALCIUM GLUCONATE-NACL 1-0.675 GM/50ML-% IV SOLN: 1.0000 g | Freq: Once | INTRAVENOUS | Status: DC

## 2020-10-30 MED ORDER — HEPARIN (PORCINE) 25000 UT/250ML-% IV SOLN
1650.0000 [IU]/h | INTRAVENOUS | Status: DC
  Administered 2020-10-31: 900 [IU]/h via INTRAVENOUS
  Administered 2020-10-31: 1100 [IU]/h via INTRAVENOUS
  Administered 2020-11-01: 1500 [IU]/h via INTRAVENOUS
  Administered 2020-11-02: 1650 [IU]/h via INTRAVENOUS
  Filled 2020-10-30 (×5): qty 250

## 2020-10-30 MED ORDER — HEPARIN BOLUS VIA INFUSION
4000.0000 [IU] | Freq: Once | INTRAVENOUS | Status: AC
  Administered 2020-10-31: 4000 [IU] via INTRAVENOUS
  Filled 2020-10-30: qty 4000

## 2020-10-30 MED ORDER — SODIUM CHLORIDE 0.9 % IV BOLUS
1000.0000 mL | Freq: Once | INTRAVENOUS | Status: AC
  Administered 2020-10-30: 1000 mL via INTRAVENOUS

## 2020-10-30 NOTE — Progress Notes (Signed)
ANTICOAGULATION CONSULT NOTE  Pharmacy Consult for heparin infusion Indication: ACS/STEMI  No Known Allergies  Patient Measurements: Height: 5\' 3"  (160 cm) Weight: 72.8 kg (160 lb 7.9 oz) IBW/kg (Calculated) : 56.9 Heparin Dosing Weight: 71.6 kg  Vital Signs: Temp: 99.1 F (37.3 C) (10/17 2313) Temp Source: Oral (10/17 2313) BP: 116/61 (10/17 2315) Pulse Rate: 93 (10/17 2315)  Labs: Recent Labs    10/30/20 2211  HGB 13.7  HCT 39.6  PLT 165    CrCl cannot be calculated (No successful lab value found.).   Medical History: Past Medical History:  Diagnosis Date   Alcoholism in remission ([place])    Anxiety    Depression    Hepatitis C    Hypertension    Kidney stone    Larynx cancer ([place]) 04/05/2017   Stage 3    Assessment: Pt is 61 yo male arriving via EMS, due family concerns of AMS, found with troponin I ~ 18K, trending up.  Goal of Therapy:  Heparin level 0.3-0.7 units/ml Monitor platelets by anticoagulation protocol: Yes   Plan:  Ordered 4000 unit bolus x 1 Start heparin infusion at 900 units/hr CrCl < 30, will check HL in 8 hrs after start of infusion Daily CBC while on heparin  Renda Rolls, PharmD, [facility] 10/30/2020 11:22 PM
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
## 2020-10-30 NOTE — ED Provider Notes (Signed)
[facility] Emergency Department Provider Note  Time seen: 10:11 PM  I have reviewed the triage vital signs and the nursing notes.   HISTORY  Chief Complaint Altered Mental Status (unresponsive) and Tachycardia   HPI Ronald Murphy is a 61 y.o. male with a past medical history of anxiety, depression, hypertension, laryngeal cancer status post tracheostomy who presents to the emergency department for altered mental status.  According to EMS patient lives at home with his wife who has dementia, son found the patient tonight to be altered and leaning over onto his side not following commands.  Per report patient is alert and oriented x3 at baseline.  Currently very sluggish will occasionally follow commands and is not answering any questions.  Patient will look at you when you talk to him.   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 Authorizing Provider  clonazePAM (KLONOPIN) 0.5 MG tablet Take 0.5 mg by mouth 2 (two) times daily as needed for anxiety.    [provider]  escitalopram (LEXAPRO) 20 MG tablet Take 20 mg by mouth daily.    [provider]  gabapentin (NEURONTIN) 300 MG capsule Take 300 mg by mouth at bedtime.    [provider]  ondansetron (ZOFRAN) 8 MG tablet Take by mouth every 8 (eight) hours as needed for nausea or vomiting.    [provider]  oxycodone (OXY-IR) 5 MG capsule Take 5 mg by mouth every 4 (four) hours as needed.    [provider]   prochlorperazine (COMPAZINE) 10 MG tablet Take 10 mg by mouth every 6 (six) hours as needed for nausea or vomiting.    [provider]  tamsulosin (FLOMAX) 0.4 MG CAPS capsule Take 0.4 mg by mouth daily.    [provider]    No Known Allergies  Family History  Adopted: Yes    Social History Social History   Tobacco Use   Smoking status: Former    Types: Cigarettes    Quit date: 2015    Years since quitting: 7.7   Smokeless tobacco: Never  Vaping Use   Vaping Use: Never used  Substance Use Topics   Alcohol use: Not Currently    Comment: quit 2015, prior use 12-15 beverages / day   Drug use: Not Currently    Types: Marijuana    Review of Systems Unable to obtain adequate/accurate review of system secondary to altered mental status. ____________________________________________   PHYSICAL EXAM:  VITAL SIGNS: ED Triage Vitals [10/30/20 2210]  Enc Vitals Group     BP      Pulse Rate 92     Resp      Temp      Temp src      SpO2 96 %     Weight      Height      Head Circumference      Peak Flow  Pain Score      Pain Loc      Pain Edu?      Excl. in [place]?     Constitutional: Awake and alert, no acute distress.  And sluggish to respond, not answering questions. Eyes: Normal exam ENT      Head: Normocephalic and atraumatic.      Mouth/Throat: Mucous membranes are moist.  Tracheostomy present.  Mucus present. Cardiovascular: Normal rate, regular rhythm.  Respiratory: Normal respiratory effort without tachypnea nor retractions. Breath sounds are clear Gastrointestinal: Soft and nontender. No distention.  Musculoskeletal: Nontender with normal range of motion in all extremities.  Neurologic: Patient is not speaking.  Does move all extremities at times although not to command. Skin:  Skin is warm, dry and intact.  Psychiatric: Mood and affect are normal.   ____________________________________________  INITIAL IMPRESSION / ASSESSMENT AND  PLAN / ED COURSE  Pertinent labs & imaging results that were available during my care of the patient were reviewed by me and considered in my medical decision making (see chart for details).   Patient presents to the emergency department for altered mental status.  No clear last known normal.  Here patient is awake, will look at you when you speak to him but is not answering questions, not following commands.  Per EMS report per son they believe the patient is normally alert and oriented x3.  No clear last known normal.  Patient will occasionally attempt to follow commands however very slow to do so.  Does appear to be moving all extremities at times.  Unable to complete an adequate neurological exam due to altered mental state.  Patient is hypoxic upon arrival placed on nonrebreather over his tracheostomy.  Appears to have mucus of the tracheostomy, respiratory therapy has suction the patient he is satting much better now.  Given the patient's altered mental state we will check labs, obtain a CT scan of the head, chest x-ray, COVID swab and continue to closely monitor.  Lab was called with a critical troponin of 17,000, no ST elevation on EKG.  Patient's potassium of 6.3, we will dose fluids, insulin/glucose, and continue to closely monitor.  I have ordered heparin infusion for the patient.  Patient will require admission likely to the intensive care unit when the remainder of his emergency department work-up is been completed including CT imaging of the head.  Patient care signed out to oncoming provider.  EKG viewed and interpreted by myself shows sinus tachycardia 102 bpm with a narrow QRS, normal axis, normal intervals, no concerning ST changes.   [name] was evaluated in Emergency Department on 10/30/2020 for the symptoms described in the history of present illness. He was evaluated in the context of the global COVID-19 pandemic, which necessitated consideration that the patient might be at  risk for infection with the SARS-CoV-2 virus that causes COVID-19. Institutional protocols and algorithms that pertain to the evaluation of patients at risk for COVID-19 are in a state of rapid change based on information released by regulatory bodies including the CDC and federal and state organizations. These policies and algorithms were followed during the patient's care in the ED.  CRITICAL CARE Performed by: Harvest Dark   Total critical care time: 30 minutes  Critical care time was exclusive of separately billable procedures and treating other patients.  Critical care was necessary to treat or prevent imminent or life-threatening deterioration.  Critical care was time spent personally by me on the following activities: development of  treatment plan with patient and/or surrogate as well as nursing, discussions with consultants, evaluation of patient's response to treatment, examination of patient, obtaining history from patient or surrogate, ordering and performing treatments and interventions, ordering and review of laboratory studies, ordering and review of radiographic studies, pulse oximetry and re-evaluation of patient's condition.  ____________________________________________   FINAL CLINICAL IMPRESSION(S) / ED DIAGNOSES  Altered mental status   Harvest Dark, MD 10/30/20 2316

## 2020-10-31 ENCOUNTER — Inpatient Hospital Stay (HOSPITAL_COMMUNITY)
Admit: 2020-10-31 | Discharge: 2020-10-31 | Disposition: A | Discharge status: Home / Self Care | Payer: Medicare HMO | Attending: Pulmonary Disease | Admitting: Pulmonary Disease

## 2020-10-31 ENCOUNTER — Inpatient Hospital Stay: Payer: Medicare HMO

## 2020-10-31 DIAGNOSIS — B179 Acute viral hepatitis, unspecified: Secondary | ICD-10-CM

## 2020-10-31 DIAGNOSIS — E43 Unspecified severe protein-calorie malnutrition: Secondary | ICD-10-CM | POA: Diagnosis present

## 2020-10-31 DIAGNOSIS — J9600 Acute respiratory failure, unspecified whether with hypoxia or hypercapnia: Secondary | ICD-10-CM

## 2020-10-31 DIAGNOSIS — R6521 Severe sepsis with septic shock: Secondary | ICD-10-CM | POA: Diagnosis present

## 2020-10-31 DIAGNOSIS — Z515 Encounter for palliative care: Secondary | ICD-10-CM | POA: Diagnosis not present

## 2020-10-31 DIAGNOSIS — E871 Hypo-osmolality and hyponatremia: Secondary | ICD-10-CM | POA: Diagnosis present

## 2020-10-31 DIAGNOSIS — B182 Chronic viral hepatitis C: Secondary | ICD-10-CM | POA: Diagnosis present

## 2020-10-31 DIAGNOSIS — K222 Esophageal obstruction: Secondary | ICD-10-CM | POA: Diagnosis not present

## 2020-10-31 DIAGNOSIS — Y842 Radiological procedure and radiotherapy as the cause of abnormal reaction of the patient, or of later complication, without mention of misadventure at the time of the procedure: Secondary | ICD-10-CM | POA: Diagnosis present

## 2020-10-31 DIAGNOSIS — F32A Depression, unspecified: Secondary | ICD-10-CM | POA: Diagnosis present

## 2020-10-31 DIAGNOSIS — J9622 Acute and chronic respiratory failure with hypercapnia: Secondary | ICD-10-CM | POA: Diagnosis present

## 2020-10-31 DIAGNOSIS — F1190 Opioid use, unspecified, uncomplicated: Secondary | ICD-10-CM

## 2020-10-31 DIAGNOSIS — L8990 Pressure ulcer of unspecified site, unspecified stage: Secondary | ICD-10-CM | POA: Diagnosis not present

## 2020-10-31 DIAGNOSIS — J9601 Acute respiratory failure with hypoxia: Secondary | ICD-10-CM

## 2020-10-31 DIAGNOSIS — G928 Other toxic encephalopathy: Secondary | ICD-10-CM | POA: Diagnosis present

## 2020-10-31 DIAGNOSIS — A4102 Sepsis due to Methicillin resistant Staphylococcus aureus: Secondary | ICD-10-CM | POA: Diagnosis present

## 2020-10-31 DIAGNOSIS — G9341 Metabolic encephalopathy: Secondary | ICD-10-CM

## 2020-10-31 DIAGNOSIS — R778 Other specified abnormalities of plasma proteins: Secondary | ICD-10-CM | POA: Diagnosis not present

## 2020-10-31 DIAGNOSIS — J15211 Pneumonia due to Methicillin susceptible Staphylococcus aureus: Secondary | ICD-10-CM | POA: Diagnosis present

## 2020-10-31 DIAGNOSIS — I214 Non-ST elevation (NSTEMI) myocardial infarction: Secondary | ICD-10-CM

## 2020-10-31 DIAGNOSIS — R471 Dysarthria and anarthria: Secondary | ICD-10-CM | POA: Diagnosis present

## 2020-10-31 DIAGNOSIS — M6282 Rhabdomyolysis: Secondary | ICD-10-CM | POA: Diagnosis present

## 2020-10-31 DIAGNOSIS — Z930 Tracheostomy status: Secondary | ICD-10-CM

## 2020-10-31 DIAGNOSIS — J9690 Respiratory failure, unspecified, unspecified whether with hypoxia or hypercapnia: Secondary | ICD-10-CM | POA: Diagnosis not present

## 2020-10-31 DIAGNOSIS — I639 Cerebral infarction, unspecified: Secondary | ICD-10-CM | POA: Diagnosis not present

## 2020-10-31 DIAGNOSIS — J9621 Acute and chronic respiratory failure with hypoxia: Secondary | ICD-10-CM | POA: Diagnosis present

## 2020-10-31 DIAGNOSIS — Z4682 Encounter for fitting and adjustment of non-vascular catheter: Secondary | ICD-10-CM | POA: Diagnosis not present

## 2020-10-31 DIAGNOSIS — C3490 Malignant neoplasm of unspecified part of unspecified bronchus or lung: Secondary | ICD-10-CM | POA: Diagnosis not present

## 2020-10-31 DIAGNOSIS — E870 Hyperosmolality and hypernatremia: Secondary | ICD-10-CM | POA: Diagnosis not present

## 2020-10-31 DIAGNOSIS — E669 Obesity, unspecified: Secondary | ICD-10-CM | POA: Diagnosis present

## 2020-10-31 DIAGNOSIS — E875 Hyperkalemia: Secondary | ICD-10-CM | POA: Diagnosis present

## 2020-10-31 DIAGNOSIS — Z8521 Personal history of malignant neoplasm of larynx: Secondary | ICD-10-CM | POA: Diagnosis not present

## 2020-10-31 DIAGNOSIS — K7200 Acute and subacute hepatic failure without coma: Secondary | ICD-10-CM | POA: Diagnosis not present

## 2020-10-31 DIAGNOSIS — N170 Acute kidney failure with tubular necrosis: Secondary | ICD-10-CM | POA: Diagnosis not present

## 2020-10-31 DIAGNOSIS — C7951 Secondary malignant neoplasm of bone: Secondary | ICD-10-CM | POA: Diagnosis present

## 2020-10-31 DIAGNOSIS — R1310 Dysphagia, unspecified: Secondary | ICD-10-CM | POA: Diagnosis not present

## 2020-10-31 DIAGNOSIS — Z20822 Contact with and (suspected) exposure to covid-19: Secondary | ICD-10-CM | POA: Diagnosis present

## 2020-10-31 DIAGNOSIS — N179 Acute kidney failure, unspecified: Secondary | ICD-10-CM

## 2020-10-31 DIAGNOSIS — Z789 Other specified health status: Secondary | ICD-10-CM | POA: Diagnosis not present

## 2020-10-31 DIAGNOSIS — R4182 Altered mental status, unspecified: Secondary | ICD-10-CM

## 2020-10-31 DIAGNOSIS — C329 Malignant neoplasm of larynx, unspecified: Secondary | ICD-10-CM | POA: Diagnosis not present

## 2020-10-31 DIAGNOSIS — I6381 Other cerebral infarction due to occlusion or stenosis of small artery: Secondary | ICD-10-CM | POA: Diagnosis present

## 2020-10-31 DIAGNOSIS — I10 Essential (primary) hypertension: Secondary | ICD-10-CM | POA: Diagnosis present

## 2020-10-31 DIAGNOSIS — F1021 Alcohol dependence, in remission: Secondary | ICD-10-CM | POA: Diagnosis present

## 2020-10-31 LAB — URINE DRUG SCREEN, QUALITATIVE (ARMC ONLY)
Amphetamines, Ur Screen: NOT DETECTED
Barbiturates, Ur Screen: NOT DETECTED
Benzodiazepine, Ur Scrn: NOT DETECTED
Cannabinoid 50 Ng, Ur ~~LOC~~: POSITIVE — AB
Cocaine Metabolite,Ur ~~LOC~~: NOT DETECTED
MDMA (Ecstasy)Ur Screen: NOT DETECTED
Methadone Scn, Ur: NOT DETECTED
Opiate, Ur Screen: POSITIVE — AB
Phencyclidine (PCP) Ur S: NOT DETECTED
Tricyclic, Ur Screen: POSITIVE — AB

## 2020-10-31 LAB — BLOOD GAS, ARTERIAL
Acid-base deficit: 1.1 mmol/L (ref 0.0–2.0)
Bicarbonate: 25.7 mmol/L (ref 20.0–28.0)
FIO2: 1
O2 Saturation: 99.4 %
Patient temperature: 37
pCO2 arterial: 51 mmHg — ABNORMAL HIGH (ref 32.0–48.0)
pH, Arterial: 7.31 — ABNORMAL LOW (ref 7.350–7.450)
pO2, Arterial: 169 mmHg — ABNORMAL HIGH (ref 83.0–108.0)

## 2020-10-31 LAB — URINALYSIS, COMPLETE (UACMP) WITH MICROSCOPIC
Bilirubin Urine: NEGATIVE
Glucose, UA: 50 mg/dL — AB
Ketones, ur: 5 mg/dL — AB
Leukocytes,Ua: NEGATIVE
Nitrite: NEGATIVE
Protein, ur: 100 mg/dL — AB
Specific Gravity, Urine: 1.014 (ref 1.005–1.030)
pH: 5 (ref 5.0–8.0)

## 2020-10-31 LAB — ECHOCARDIOGRAM COMPLETE BUBBLE STUDY
AR max vel: 0.99 cm2
AV Area VTI: 1.59 cm2
AV Area mean vel: 1.11 cm2
AV Mean grad: 10 mmHg
AV Peak grad: 12.4 mmHg
Ao pk vel: 1.76 m/s
Area-P 1/2: 2.57 cm2
MV VTI: 1.63 cm2
S' Lateral: 2 cm

## 2020-10-31 LAB — HEPATITIS PANEL, ACUTE
HCV Ab: REACTIVE — AB
Hep A IgM: NONREACTIVE
Hep B C IgM: NONREACTIVE
Hepatitis B Surface Ag: NONREACTIVE

## 2020-10-31 LAB — BLOOD GAS, VENOUS
Acid-base deficit: 5.6 mmol/L — ABNORMAL HIGH (ref 0.0–2.0)
Acid-base deficit: 5.1 mmol/L — ABNORMAL HIGH (ref 0.0–2.0)
Bicarbonate: 23 mmol/L (ref 20.0–28.0)
Bicarbonate: 22.7 mmol/L (ref 20.0–28.0)
FIO2: 40
MECHVT: 450 mL
Mechanical Rate: 15
O2 Saturation: 92.9 %
O2 Saturation: 82 %
PEEP: 5 cmH2O
Patient temperature: 37
Patient temperature: 37
pCO2, Ven: 55 mmHg (ref 44.0–60.0)
pCO2, Ven: 53 mmHg (ref 44.0–60.0)
pH, Ven: 7.23 — ABNORMAL LOW (ref 7.250–7.430)
pH, Ven: 7.24 — ABNORMAL LOW (ref 7.250–7.430)
pO2, Ven: 79 mmHg — ABNORMAL HIGH (ref 32.0–45.0)
pO2, Ven: 55 mmHg — ABNORMAL HIGH (ref 32.0–45.0)

## 2020-10-31 LAB — LACTIC ACID, PLASMA
Lactic Acid, Venous: 2.8 mmol/L (ref 0.5–1.9)
Lactic Acid, Venous: 1.7 mmol/L (ref 0.5–1.9)

## 2020-10-31 LAB — LIPID PANEL
Cholesterol: 115 mg/dL (ref 0–200)
HDL: 21 mg/dL — ABNORMAL LOW (ref >40)
LDL Cholesterol: 75 mg/dL (ref 0–99)
Total CHOL/HDL Ratio: 5.5 RATIO
Triglycerides: 95 mg/dL (ref <150)
VLDL: 19 mg/dL (ref 0–40)

## 2020-10-31 LAB — COMPREHENSIVE METABOLIC PANEL
ALT: 2410 U/L — ABNORMAL HIGH (ref 0–44)
AST: 4293 U/L — ABNORMAL HIGH (ref 15–41)
Albumin: 2.5 g/dL — ABNORMAL LOW (ref 3.5–5.0)
Alkaline Phosphatase: 74 U/L (ref 38–126)
Anion gap: 8 (ref 5–15)
BUN: 38 mg/dL — ABNORMAL HIGH (ref 8–23)
CO2: 24 mmol/L (ref 22–32)
Calcium: 7.5 mg/dL — ABNORMAL LOW (ref 8.9–10.3)
Chloride: 98 mmol/L (ref 98–111)
Creatinine, Ser: 3.16 mg/dL — ABNORMAL HIGH (ref 0.61–1.24)
GFR, Estimated: 22 mL/min — ABNORMAL LOW (ref >60)
Glucose, Bld: 103 mg/dL — ABNORMAL HIGH (ref 70–99)
Potassium: 5 mmol/L (ref 3.5–5.1)
Sodium: 130 mmol/L — ABNORMAL LOW (ref 135–145)
Total Bilirubin: 0.9 mg/dL (ref 0.3–1.2)
Total Protein: 6.1 g/dL — ABNORMAL LOW (ref 6.5–8.1)

## 2020-10-31 LAB — CBC
HCT: 33 % — ABNORMAL LOW (ref 39.0–52.0)
Hemoglobin: 11.3 g/dL — ABNORMAL LOW (ref 13.0–17.0)
MCH: 30.3 pg (ref 26.0–34.0)
MCHC: 34.2 g/dL (ref 30.0–36.0)
MCV: 88.5 fL (ref 80.0–100.0)
Platelets: 139 10*3/uL — ABNORMAL LOW (ref 150–400)
RBC: 3.73 MIL/uL — ABNORMAL LOW (ref 4.22–5.81)
RDW: 14 % (ref 11.5–15.5)
WBC: 9.8 10*3/uL (ref 4.0–10.5)
nRBC: 0 % (ref 0.0–0.2)

## 2020-10-31 LAB — GLUCOSE, CAPILLARY
Glucose-Capillary: 111 mg/dL — ABNORMAL HIGH (ref 70–99)
Glucose-Capillary: 129 mg/dL — ABNORMAL HIGH (ref 70–99)
Glucose-Capillary: 135 mg/dL — ABNORMAL HIGH (ref 70–99)
Glucose-Capillary: 182 mg/dL — ABNORMAL HIGH (ref 70–99)
Glucose-Capillary: 95 mg/dL (ref 70–99)
Glucose-Capillary: 99 mg/dL (ref 70–99)

## 2020-10-31 LAB — CK
Total CK: 16180 U/L — ABNORMAL HIGH (ref 49–397)
Total CK: 12478 U/L — ABNORMAL HIGH (ref 49–397)

## 2020-10-31 LAB — POTASSIUM: Potassium: 5.1 mmol/L (ref 3.5–5.1)

## 2020-10-31 LAB — TROPONIN I (HIGH SENSITIVITY)
Troponin I (High Sensitivity): 19822 ng/L (ref <18)
Troponin I (High Sensitivity): 16665 ng/L (ref <18)
Troponin I (High Sensitivity): 17317 ng/L (ref <18)
Troponin I (High Sensitivity): 16782 ng/L (ref <18)
Troponin I (High Sensitivity): 11936 ng/L (ref <18)

## 2020-10-31 LAB — HEPARIN LEVEL (UNFRACTIONATED)
Heparin Unfractionated: <0.1 IU/mL — ABNORMAL LOW (ref 0.30–0.70)
Heparin Unfractionated: 0.1 IU/mL — ABNORMAL LOW (ref 0.30–0.70)

## 2020-10-31 LAB — PROCALCITONIN: Procalcitonin: 1.73 ng/mL

## 2020-10-31 LAB — ACETAMINOPHEN LEVEL: Acetaminophen (Tylenol), Serum: <10 ug/mL — ABNORMAL LOW (ref 10–30)

## 2020-10-31 LAB — CBG MONITORING, ED: Glucose-Capillary: 119 mg/dL — ABNORMAL HIGH (ref 70–99)

## 2020-10-31 LAB — URINE CULTURE: Culture: NO GROWTH

## 2020-10-31 LAB — APTT: aPTT: 40 seconds — ABNORMAL HIGH (ref 24–36)

## 2020-10-31 LAB — PHOSPHORUS: Phosphorus: 5.5 mg/dL — ABNORMAL HIGH (ref 2.5–4.6)

## 2020-10-31 LAB — SALICYLATE LEVEL: Salicylate Lvl: <7 mg/dL — ABNORMAL LOW (ref 7.0–30.0)

## 2020-10-31 LAB — MRSA NEXT GEN BY PCR, NASAL: MRSA by PCR Next Gen: NOT DETECTED

## 2020-10-31 LAB — AMMONIA: Ammonia: 51 umol/L — ABNORMAL HIGH (ref 9–35)

## 2020-10-31 LAB — PROTIME-INR
INR: 1.9 — ABNORMAL HIGH (ref 0.8–1.2)
Prothrombin Time: 21.5 seconds — ABNORMAL HIGH (ref 11.4–15.2)

## 2020-10-31 LAB — MAGNESIUM: Magnesium: 2.2 mg/dL (ref 1.7–2.4)

## 2020-10-31 LAB — HIV ANTIBODY (ROUTINE TESTING W REFLEX): HIV Screen 4th Generation wRfx: NONREACTIVE

## 2020-10-31 IMAGING — DX DG ABDOMEN 1V
1 series · 1 of 1 positions shown · non-contrast
Comparison: CT [DATE]

CLINICAL DATA: Encounter for imaging study to confirm nasogastric
(NG) tube placement [CD] ([CD]-CM)

EXAM:
ABDOMEN - 1 VIEW

[abdomen supine]
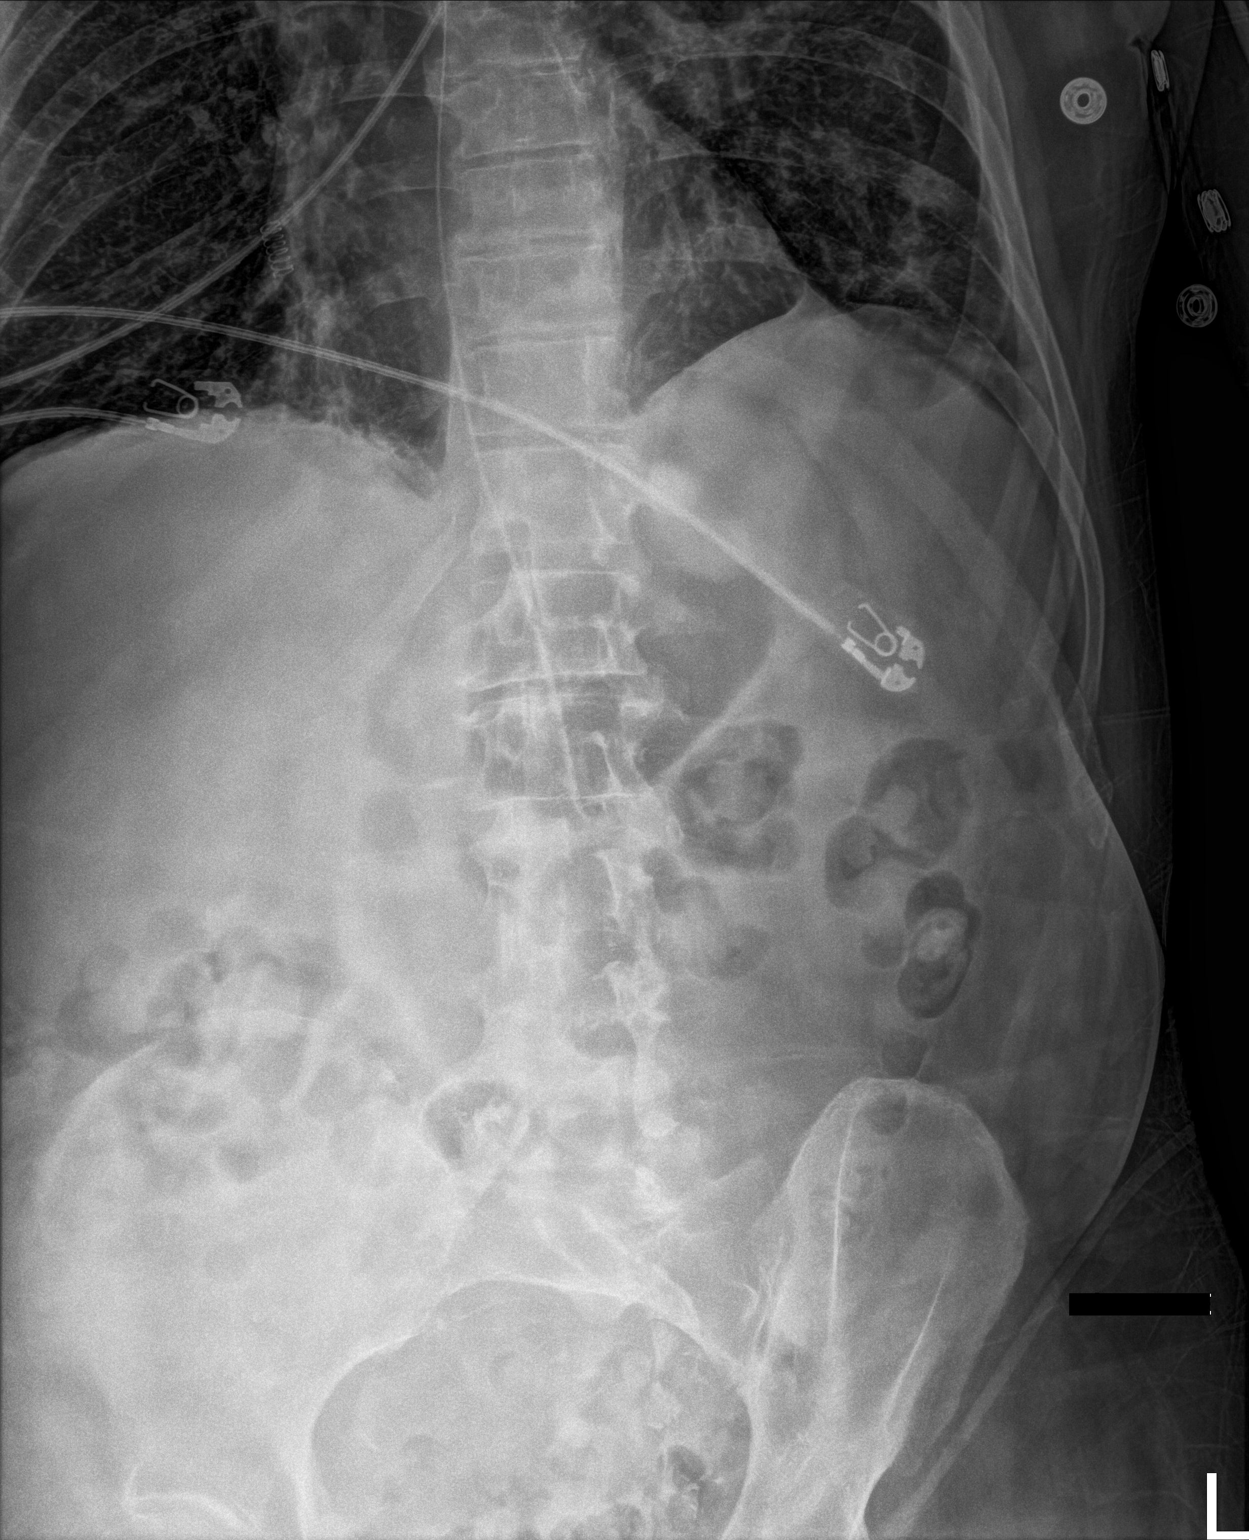

[1 of 1 positions shown; findings below may reference images not displayed]

FINDINGS: Limited radiograph of the lower chest and upper abdomen was obtained
for the purposes of enteric tube localization. Enteric tube is seen
coursing below the diaphragm with distal tip and side port
terminating within the expected location of the gastric body.
Visualized bowel gas pattern is nonobstructive. Patchy bibasilar
opacities.
IMPRESSION: Enteric tube tip and side port terminating within the expected
location of the gastric body.

## 2020-10-31 MED ORDER — DOCUSATE SODIUM 50 MG/5ML PO LIQD
100.0000 mg | Freq: Every day | ORAL | Status: DC | PRN
  Filled 2020-10-31: qty 10

## 2020-10-31 MED ORDER — POLYETHYLENE GLYCOL 3350 17 G PO PACK: 17.0000 g | PACK | Freq: Every day | ORAL | Status: DC | PRN

## 2020-10-31 MED ORDER — CHLORHEXIDINE GLUCONATE 0.12% ORAL RINSE (MEDLINE KIT)
15.0000 mL | Freq: Two times a day (BID) | OROMUCOSAL | Status: DC
  Administered 2020-10-31 – 2020-11-15 (×31): 15 mL via OROMUCOSAL

## 2020-10-31 MED ORDER — HEPARIN BOLUS VIA INFUSION
2100.0000 [IU] | Freq: Once | INTRAVENOUS | Status: AC
  Administered 2020-10-31: 2100 [IU] via INTRAVENOUS
  Filled 2020-10-31: qty 2100

## 2020-10-31 MED ORDER — SODIUM CHLORIDE 0.9 % IV BOLUS
1000.0000 mL | Freq: Once | INTRAVENOUS | Status: AC
  Administered 2020-10-31: 1000 mL via INTRAVENOUS

## 2020-10-31 MED ORDER — PANTOPRAZOLE SODIUM 40 MG IV SOLR
40.0000 mg | Freq: Every day | INTRAVENOUS | Status: DC
  Administered 2020-10-31 – 2020-11-14 (×16): 40 mg via INTRAVENOUS
  Filled 2020-10-31 (×16): qty 40

## 2020-10-31 MED ORDER — ORAL CARE MOUTH RINSE
15.0000 mL | OROMUCOSAL | Status: DC
Start: 2020-10-31 — End: 2020-11-08
  Administered 2020-10-31 – 2020-11-08 (×73): 15 mL via OROMUCOSAL

## 2020-10-31 MED ORDER — MIDAZOLAM HCL 2 MG/2ML IJ SOLN
2.0000 mg | Freq: Once | INTRAMUSCULAR | Status: AC
  Administered 2020-11-01: 2 mg via INTRAVENOUS
  Filled 2020-10-31: qty 2

## 2020-10-31 MED ORDER — LACTULOSE 10 GM/15ML PO SOLN
30.0000 g | Freq: Two times a day (BID) | ORAL | Status: DC
  Administered 2020-10-31 – 2020-11-01 (×5): 30 g
  Filled 2020-10-31 (×5): qty 60

## 2020-10-31 MED ORDER — BUDESONIDE 0.5 MG/2ML IN SUSP
0.5000 mg | Freq: Two times a day (BID) | RESPIRATORY_TRACT | Status: DC
  Administered 2020-10-31 – 2020-11-06 (×13): 0.5 mg via RESPIRATORY_TRACT
  Filled 2020-10-31 (×13): qty 2

## 2020-10-31 MED ORDER — METHYLPREDNISOLONE SODIUM SUCC 40 MG IJ SOLR
40.0000 mg | INTRAMUSCULAR | Status: DC
  Administered 2020-10-31 – 2020-11-03 (×4): 40 mg via INTRAVENOUS
  Filled 2020-10-31 (×4): qty 1

## 2020-10-31 MED ORDER — CEFTRIAXONE SODIUM 1 G IJ SOLR
1.0000 g | INTRAMUSCULAR | Status: DC
Start: 2020-10-31 — End: 2020-11-03
  Administered 2020-10-31 – 2020-11-02 (×3): 1 g via INTRAVENOUS
  Filled 2020-10-31 (×4): qty 10

## 2020-10-31 MED ORDER — NOREPINEPHRINE 4 MG/250ML-% IV SOLN
2.0000 ug/min | INTRAVENOUS | Status: DC
  Administered 2020-10-31: 5 ug/min via INTRAVENOUS
  Filled 2020-10-31: qty 250

## 2020-10-31 MED ORDER — SODIUM CHLORIDE 0.9 % IV SOLN
1.0000 g | Freq: Once | INTRAVENOUS | Status: AC
  Administered 2020-10-31: 1 g via INTRAVENOUS
  Filled 2020-10-31: qty 10

## 2020-10-31 MED ORDER — IPRATROPIUM-ALBUTEROL 0.5-2.5 (3) MG/3ML IN SOLN
3.0000 mL | Freq: Four times a day (QID) | RESPIRATORY_TRACT | Status: DC
  Administered 2020-10-31 – 2020-11-02 (×8): 3 mL via RESPIRATORY_TRACT
  Filled 2020-10-31 (×8): qty 3

## 2020-10-31 MED ORDER — SODIUM CHLORIDE 0.9 % IV SOLN
INTRAVENOUS | Status: AC
  Administered 2020-10-31: 100 mL/h via INTRAVENOUS

## 2020-10-31 MED ORDER — CHLORHEXIDINE GLUCONATE CLOTH 2 % EX PADS
6.0000 | MEDICATED_PAD | Freq: Every day | CUTANEOUS | Status: DC
  Administered 2020-10-31 – 2020-11-15 (×16): 6 via TOPICAL

## 2020-10-31 MED ORDER — DOCUSATE SODIUM 100 MG PO CAPS: 100.0000 mg | ORAL_CAPSULE | Freq: Two times a day (BID) | ORAL | Status: DC | PRN

## 2020-10-31 MED ORDER — JUVEN PO PACK
1.0000 | PACK | Freq: Two times a day (BID) | ORAL | Status: DC
  Administered 2020-11-01 – 2020-11-06 (×12): 1

## 2020-10-31 MED ORDER — SODIUM ZIRCONIUM CYCLOSILICATE 5 G PO PACK
10.0000 g | PACK | Freq: Once | ORAL | Status: AC
  Administered 2020-10-31: 10 g
  Filled 2020-10-31: qty 2

## 2020-10-31 MED ORDER — SODIUM CHLORIDE 0.9 % IV SOLN
250.0000 mL | INTRAVENOUS | Status: DC
  Administered 2020-11-08 – 2020-11-09 (×4): 250 mL via INTRAVENOUS

## 2020-10-31 MED ORDER — SODIUM CHLORIDE 0.9 % IV SOLN
500.0000 mg | Freq: Once | INTRAVENOUS | Status: AC
  Administered 2020-10-31: 500 mg via INTRAVENOUS
  Filled 2020-10-31: qty 500

## 2020-10-31 MED ORDER — WHITE PETROLATUM EX OINT
TOPICAL_OINTMENT | CUTANEOUS | Status: DC | PRN
  Administered 2020-11-05 – 2020-11-07 (×2): 1 via TOPICAL
  Filled 2020-10-31 (×3): qty 5

## 2020-10-31 MED ORDER — VITAL AF 1.2 CAL PO LIQD
1000.0000 mL | ORAL | Status: DC
  Administered 2020-10-31 – 2020-11-05 (×4): 1000 mL

## 2020-10-31 MED ORDER — LACTATED RINGERS IV BOLUS
1000.0000 mL | Freq: Once | INTRAVENOUS | Status: AC
  Administered 2020-10-31: 1000 mL via INTRAVENOUS

## 2020-10-31 MED ORDER — AZITHROMYCIN 500 MG IV SOLR
500.0000 mg | INTRAVENOUS | Status: AC
Start: 2020-10-31 — End: 2020-11-03
  Administered 2020-10-31 – 2020-11-03 (×4): 500 mg via INTRAVENOUS
  Filled 2020-10-31 (×4): qty 500

## 2020-10-31 MED ORDER — FREE WATER
30.0000 mL | Status: DC
  Administered 2020-10-31 – 2020-11-05 (×29): 30 mL

## 2020-10-31 NOTE — Progress Notes (Signed)
Eeg done 

## 2020-10-31 NOTE — Progress Notes (Signed)
Vent rate increased to 20 per NP.

## 2020-10-31 NOTE — Progress Notes (Signed)
[place] for heparin infusion Indication: ACS/STEMI  No Known Allergies  Patient Measurements: Height: 5\' 3"  (160 cm) Weight: 72.8 kg (160 lb 7.9 oz) IBW/kg (Calculated) : 56.9 Heparin Dosing Weight: 71.6 kg  Vital Signs: Temp: 96.7 F (35.9 C) (10/18 0734) Temp Source: Axillary (10/18 0734) BP: 218/189 (10/18 1059) Pulse Rate: 95 (10/18 1059)  Labs: Recent Labs    10/30/20 2211 10/31/20 0152 10/31/20 0421 10/31/20 0539 10/31/20 0859  HGB 13.7  --  11.3*  --   --   HCT 39.6  --  33.0*  --   --   PLT 165  --  139*  --   --   APTT 40*  --   --   --   --   LABPROT 21.5*  --   --   --   --   INR 1.9*  --   --   --   --   HEPARINUNFRC  --   --   --   --  <0.10*  CREATININE 3.64*  --  3.16*  --   --   CKTOTAL 16,180*  --  12,478*  --   --   TROPONINIHS 17,932*   < > 16,665* 17,317* 16,782*   < > = values in this interval not displayed.     Estimated Creatinine Clearance: 22 mL/min (A) (by C-G formula based on SCr of 3.16 mg/dL (H)).   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 Concern for NSTEMI. Pharmacy consult for heparin.  Goal of Therapy:  Heparin level 0.3-0.7 units/ml Monitor platelets by anticoagulation protocol: Yes   Plan:  10/18 0859 HL < 0.10, subtherapeutic Heparin 2100 unit bolus Increase heparin infusion to 1100 units/hr Recheck HL at 2000 CBC daily while on heparin  Tawnya Crook, PharmD, BCPS Clinical Pharmacist 10/31/2020 11:07 AM
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
## 2020-10-31 NOTE — Consult Note (Signed)
[place] [facility] TeleNeurology Consult Services  Stat Consult  Patient Name:   Ronald Murphy, Ronald Murphy Date of Birth:   [birth year] Identification Number:   MRN - [id number] Date of Service:   10/31/2020 02:51:51  Diagnosis:       G93.49 - Encephalopathy Multifactorial  Impression Pt with decreased responsiveness likely multifactorial encephalopathy vs diffuse anoxic injury in the setting of NSTEMI (trop 17k+), multiple metabolic derangements. Ddx includes seizure. No clear seizure like activity seen. No focal or asymmetric findings on exam are present. Most likely diffuse anoxic injury related to cardiac event resulting in AMS. Pt on heparin drip for NSTEMI, agree with this.   CT HEAD: As Per Radiologist CT Head Showed No Acute Hemorrhage or Acute Core Infarct Reviewed chronic appearing bilateral BG hypodensities  Advanced Imaging: Advanced Imaging Not Completed because:  not medically stable enough for intervention   Our recommendations are outlined below.  Diagnostic Studies: Recommend MRI brain without contrast Routine MRA head without contrast and MRA Neck with contrast Transthoracic Echo with bubble study, if available EEG will be obtained as soon as possible, discussed with NP at bedside.   Laboratory Studies: Recommend Lipid panel Hemoglobin A1c TSH  Nursing Recommendations: Telemetry, IV Fluids, avoid dextrose containing fluids, Maintain euglycemia Neuro checks q4 hrs x 24 hrs and then per shift Head of bed 30 degrees  Consultations: Recommend Speech therapy if failed dysphagia screen Physical therapy/Occupational therapy  DVT Prophylaxis: Choice of Primary Team  Disposition: Neurology will follow   Metrics: [facility] Notification Time: 10/31/2020 02:47:50 Stamp Time: 10/31/2020 02:51:51 Callback Response Time: 10/31/2020  02:57:49   ----------------------------------------------------------------------------------------------------  Chief Complaint: altered  History of Present Illness: Patient is a 60 year old Male.  Ronald Murphy is a 60 y.o. male with a past medical history of anxiety, depression, hypertension, laryngeal cancer status post tracheostomy who presents to the emergency department for altered mental status. According to EMS patient lives at home with his wife who has dementia, son found the patient to be altered and leaning over onto his side not following commands. Per report patient is alert and oriented x3 at baseline. LKW 10/17 1300. Troponin 17,000, NSTEMI. Pt has not been verbal, not following commands   Past Medical History: Hypertension  Anticoagulant use:  No  Antiplatelet use: No   Examination: BP(95/59), Pulse(92), Blood Glucose(95) 1A: Level of Consciousness - Postures or Unresponsive + 3 1B: Ask Month and Age - Could Not Answer Either Question Correctly + 2 1C: Blink Eyes & Squeeze Hands - Performs 0 Tasks + 2 2: Test Horizontal Extraocular Movements - Normal + 0 3: Test Visual Fields - No Visual Loss + 0 4: Test Facial Palsy (Use Grimace if Obtunded) - Normal symmetry + 0 5A: Test Left Arm Motor Drift - Drift, but doesn't hit bed + 1 5B: Test Right Arm Motor Drift - Drift, but doesn't hit bed + 1 6A: Test Left Leg Motor Drift - Some Effort Against Gravity + 2 6B: Test Right Leg Motor Drift - Some Effort Against Gravity + 2 7: Test Limb Ataxia (FNF/Heel-Shin) - Does Not Understand + 0 8: Test Sensation - Coma/Unresponsive + 2 9: Test Language/Aphasia - Coma/Unresponsive + 3 10: Test Dysarthria - Mute/Anarthric + 2 11: Test Extinction/Inattention - No abnormality + 0  NIHSS Score: 20     Patient / Family was informed the Neurology Consult would occur via TeleHealth consult by way of interactive audio and video telecommunications and consented to receiving care  in this manner.  Patient is being evaluated for possible acute neurologic impairment and high probability of imminent or life - threatening deterioration.I spent total of 26 minutes providing care to this patient, including time for face to face visit via telemedicine, review of medical records, imaging studies and discussion of findings with providers, the patient and / or family.   Dr Jeralene Huff   [facility] [phone]  Case [id number]

## 2020-10-31 NOTE — Progress Notes (Addendum)
PHARMACY CONSULT NOTE - FOLLOW UP  Pharmacy Consult for Electrolyte Monitoring and Replacement   Recent Labs: Potassium (mmol/L)  Date Value  10/31/2020 5.0   Magnesium (mg/dL)  Date Value  10/31/2020 2.2   Calcium (mg/dL)  Date Value  10/31/2020 7.5 (L)   Albumin (g/dL)  Date Value  10/31/2020 2.5 (L)   Phosphorus (mg/dL)  Date Value  10/31/2020 5.5 (H)   Sodium (mmol/L)  Date Value  10/31/2020 130 (L)     Assessment: 59 year old male presented with AMS. Patient found to have elevated creatinine and LFTs. Concern for NSTEMI with increased troponin. He has a history of laryngeal cancer with tracheostomy in place. Patient admitted to the ICU. Pharmacy consult to manage electrolytes.  Goal of Therapy:  Electrolytes WNL  Plan:  --Hyperkalemia improved post interventions --Planning to start tube feeds, at risk for refeeding syndrome --Follow up electrolytes with morning labs  Tawnya Crook, PharmD, BCPS Clinical Pharmacist 10/31/2020 12:03 PM
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
## 2020-10-31 NOTE — Consult Note (Signed)
Neurology Consultation Reason for Consult: Multifactorial encephalopathy Requesting Physician: Flora Lipps  CC: Altered mental status  History is obtained from: Chart review and daughter-in-law at bedside  HPI: Ronald Murphy is a 60 y.o. 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 (s/p completion of radiation treatments in June 2019 with chemotherapy begun but truncated secondary to tolerability, known C1 metastasis s/p CyberKnife radiation treatment and chemotherapy, most recently on durvalumab which was started 02/04/2019 with last infusion on 02/23/2020), hypertension, alcoholism in remission, anxiety/depression, kidney stones  He was seen by telespecialists as a code stroke (last known well 10/17 at 77, [place] 20 largely secondary to mental status without clear focality), felt to have multifactorial encephalopathy versus diffuse anoxic injury in the setting of possible cardiac event given his troponinemia, and started on heparin drip for concern for ACS, with neurological plan including MRI brain without contrast, MRA head and neck, TTE and EEG.  CT did show bilateral basal ganglia hypodensities new from MRI brain completed in May of this year but not clearly acute in appearance  On admission he was noted to have a severe transaminitis (AST 7291, ALT 2441, ammonia 51, T bili 1.4); as well as a troponinemia (17,932 with peak at 19,822); CK at 16,180, downtrending to 12,478; hyponatremia to 129, hyperkalemia to 6.3, creatinine of 3.64 with GFR of 18 (baseline creatinine 0.78 in 08/2020).  Lactic acid was 2.8 UA demonstrated a large hemoglobin pigment and some proteinuria, few bacteria and hyaline casts without nitrates or leukocytes UDS was positive for cannabinoids, opiates and tricyclics, blood alcohol level is undetectable  Daughter-in-law at bedside reports she has felt he was sleep ER on her weekly Sunday visits the past couple of weeks  but otherwise was not aware of any acute concerns.  She notes that the patient's wife at bedside does have some memory impairment which makes knowing any recent symptoms with confidence challenging.  Regarding his oncological history, he follows with Dr. Amada Jupiter of otolaryngology at [facility], last seen on 10/04/2020.  His last MRI brain on 05/17/2020 was negative for new intracranial pathology.  He did have a FLL performed which did not show any changes concerning for malignancy and was felt to show just some postradiation changes and he was referred to Dr. Manuella Ghazi for consideration of airway surgery to see if he could become a decannulation candidate in the future for his chronic tracheostomy.  Pertinent recent oncological surveillance imaging as below:  08/16/20 CT Chest Wo Contrast: IMPRESSION: *Left lung post radiation changes with increased masslike opacity in the left lower lobe which could represent evolving radiation fibrosis or recurrent neoplasm. Consider need for further characterization with FDG PET/CT. *Improved aeration of the right middle lobe and both lower lobes with residual small airways disease. *Moderate coronary artery atherosclerosis. *Slight decreased size of small volume pericardial effusion. *Aortic valvular sclerosis which can be seen with valvular stenosis. *Stable wall thickening of the distal esophagus.  05/24/20 CT Abdomen Pelvis: IMPRESSION: 1. No new sites of metastatic disease in the abdomen or pelvis when compared to 12/15/2019 and accounting for lack of IV contrast. 2. Stable appearance of the right kidney with pelvocaliectasis and calcification/layering milk of calcium. 3. Please refer to dedicated CT chest performed concurrently, dictated separately for pulmonary findings.  05/17/20 MRI Brain: IMPRESSION: No evidence for intracranial metastasis.   05/17/20 CT Neck w contrast: IMPRESSION: Unchanged edema of the laryngeal mucosa with no discrete lesion identified. No cervical  lymphadenopathy.  Circumferential esophageal wall thickening most prominent at the level of the thoracic inlet. Recommend correlation with any symptoms and endoscopy. Slight interval increase in size of a precarinal lymph node, indeterminate. Increased consolidative opacity in the left upper lobe compared to the prior CT neck, but unchanged since the more recent PET, may reflect evolving radiation change.   03/08/20 PET CT Skull Base to Thigh: IMPRESSION: - Sequelae of left lower lobe radiation fibrosis. There is no evidence of mass lesion or intense focal uptake noted to suggest recurrent. There is mild diffuse uptake noted in the region of fibrosis which likely is inflammatory/reactive. Attention on follow-up. -Interval decrease in multifocal bilateral patchy groundglass opacities with residual bibasilar opacities that show mild uptake. Likely related to infection/aspiration. However, metastasis cannot entirely be excluded. Recommend follow-up chest CT in 8-12 weeks to evaluate change. -New moderately avid right hilar lymphadenopathy. Left hilar increased uptake without definite lymphadenopathy. Indeterminant, likely reactive versus infectious. Metastasis/recurrence cannot be excluded. Attention on follow-up.     ROS: Unable to obtain due to altered mental status.   Past Medical History:  Diagnosis Date   Alcoholism in remission ([place])    Anxiety    Depression    Hepatitis C    Hypertension    Kidney stone    Larynx cancer ([place]) 04/05/2017   Stage 3   Past Surgical History:  Procedure Laterality Date   SPINE SURGERY  1991     Family History  Adopted: Yes    Social History:  reports that he quit smoking about 7 years ago. He has never used smokeless tobacco. He reports that he does not currently use alcohol. He reports that he does not currently use drugs after having used the following drugs: Marijuana.   Exam: Current vital signs: BP 105/75 (BP Location: Left Arm)   Pulse 66    Temp (!) 96.7 F (35.9 C) (Axillary)   Resp 20   Ht 5\' 3"  (1.6 m)   Wt 72.8 kg   SpO2 97%   BMI 28.43 kg/m  Vital signs in last 24 hours: Temp:  [96.7 F (35.9 C)-99.7 F (37.6 C)] 96.7 F (35.9 C) (10/18 0734) Pulse Rate:  [66-102] 66 (10/18 0600) Resp:  [12-26] 20 (10/18 0600) BP: (64-127)/(44-84) 105/75 (10/18 0600) SpO2:  [92 %-100 %] 97 % (10/18 0822) FiO2 (%):  [40 %-100 %] 40 % (10/18 0822) Weight:  [72.8 kg] 72.8 kg (10/17 2212)   Physical Exam  Constitutional: Appears well-developed and well-nourished.  Psych: Minimally interactive, flat affect Eyes: No scleral injection HENT: NG tube and nasal cannula in place MSK: no joint deformities.  Cardiovascular: Normal rate and regular rhythm.  Respiratory: Effort normal, non-labored breathing GI: Soft.  No distension. There is no tenderness.  Skin: Warm dry and intact visible skin  Neuro: Mental Status: Patient opens eyes once legs are stimulated and regards examiner but has no verbal output and does not follow any commands reliably.  At one point he does perhaps squeeze left hand but this is not reproducible.  Appears to have a subtle left-sided neglect Cranial Nerves: II: Visual Fields are full to blink to threat. Pupils are equal, round, and reactive to light.   III,IV, VI: EOMI perhaps with a right gaze preference but can cross fully to the left V: Facial sensation is symmetric to light eyelash brush VII: Facial movement is symmetric.  VIII: hearing is intact to voice, orients to examiner Remainder unable to assess given mental status Sensory/motor: Tone is notable  for some paratonia. Bulk is normal.  Bilateral arms drop slowly to the bed when raised by the examiner.  Bilateral legs 2/5 to tickle, grossly equal.  He does have a prominent tremor of the right upper extremity predominantly, but also involvement of the right lower extremity at times and far less involvement of the left side very occasionally.  This is a  moderate frequency low amplitude tremor which is suppressible and he remains interactive (able to orient to examiner) during the tremors. Deep Tendon Reflexes: 2+ in the brachioradialis and patellae but possibly slightly brisker on the right than the left  Plantars: Upgoing on the right, equivocal on the left Cerebellar: Unable to assess given mental status    I have reviewed labs in epic and the results pertinent to this consultation are: On admission he was noted to have a severe transaminitis (AST 7291, ALT 2441, ammonia 51, T bili 1.4); as well as a troponinemia (17,932 with peak at 19,822); CK at 16,180, downtrending to 12,478; hyponatremia to 129, hyperkalemia to 6.3, creatinine of 3.64 with GFR of 18 (baseline creatinine 0.78 in 08/2020).  Lactic acid was 2.8 UA demonstrated a large hemoglobin pigment and some proteinuria, few bacteria and hyaline casts without nitrates or leukocytes UDS was positive for cannabinoids, opiates and tricyclics, blood alcohol level is undetectable  I have reviewed the images obtained:  Head CT personally reviewed, there are bilateral basal ganglia hypodensities of unclear etiology for which MRI will be useful for further characterization   Impression:  -Bilateral basal ganglia hypodensities -Acute encephalopathy, multifactorial in the setting of multiorgan failure -Tremor secondary to metabolic dysfunction -History of laryngeal cancer with concern on recent imaging for progressive radiation changes versus progressive malignancy -Acute liver impairment, troponinemia, AKI, rhabdomyolysis  Recommendations: -Agree with routine EEG -MRI brain without contrast and MRA head without contrast, may need contrasted studies pending these results and improvement in renal function -Appreciate management of comorbidities per CCM -Neurology will continue to follow  Lesleigh Noe MD-PhD [facility] [phone] [facility] coverage for  [facility] is from 8 AM to 4 AM in-house and 4 PM to 8 PM by telephone/video. 8 PM to 8 AM emergent questions or overnight urgent questions should be addressed to Teleneurology On-call or [facility] neurohospitalist; contact information can be found on AMION  CRITICAL CARE Performed by: Lorenza Chick   Total critical care time: 70 minutes  Critical care time was exclusive of separately billable procedures and treating other patients.  Critical care was necessary to treat or prevent imminent or life-threatening deterioration.  Critical care was time spent personally by me on the following activities: development of treatment plan with patient and/or surrogate as well as nursing, discussions with consultants/primary team, evaluation of patient's response to treatment, examination of patient, obtaining history from patient or surrogate, ordering and performing treatments and interventions, ordering and review of laboratory studies, ordering and review of radiographic studies, pulse oximetry and re-evaluation of patient's condition.

## 2020-10-31 NOTE — Progress Notes (Signed)
*  PRELIMINARY RESULTS* Echocardiogram 2D Echocardiogram has been performed.  Sherrie Sport 10/31/2020, 1:29 PM

## 2020-10-31 NOTE — Progress Notes (Signed)
GOALS OF CARE DISCUSSION  The Clinical status was relayed to family in detail. Daughter  Constance Holster  Updated and notified of patients medical condition.    Patient remains unresponsive and will not open eyes to command.   Patient is having a weak cough and struggling to remove secretions.   Patient with increased WOB and using accessory muscles to breathe Explained to family course of therapy and the modalities    Patient with Progressive multiorgan failure with a very high probablity of a very minimal chance of meaningful recovery despite all aggressive and optimal medical therapy.  PATIENT REMAINS FULL CODE  Family understands the situation.  Family are satisfied with Plan of action and management. All questions answered  Additional CC time 35 mins   Phil Corti Patricia Pesa, M.D.  [facility]  Medical Director [place] Director [facility] Cardio-Pulmonary Department

## 2020-10-31 NOTE — Progress Notes (Signed)
eLink Physician-Brief Progress Note Patient Name: Ronald Murphy DOB: 09-09-1960 MRN: [id number]   Date of Service  10/31/2020  HPI/Events of Note  60/M with esophageal CA, chronic respiratory failure, with trache in place, brought in due to altered mental status. Troponin was elevated at 17,932 and patient started on heparin gtt.   CXR - diffuse hazy opacity on the left lung  CT head - Bilateral basal ganglia lacunar infarcts, age indeterminate but favor chronic.  eICU Interventions  NSTEMI Encephalopathy CVA Acute on chronic respiratory failure Pneumonia Hyperkalemia AKI baseline crea 0.78  Continue heparin gtt.  Agree with antibiotics for CAP.  Start on stroke protocol, get teleneurology consult. Start on ASA, statin.  Follow up CT.  Check 2d echo.  Keep on the vent.  Pantoprazole for GI prophylaxis.      Intervention Category Evaluation Type: New Patient Evaluation  Elsie Lincoln 10/31/2020, 3:00 AM

## 2020-10-31 NOTE — Procedures (Signed)
Patient Name: Ronald Murphy  MRN: [id number]  Epilepsy Attending: Lora Havens  Referring Physician/Provider: Rust-Chester, Huel Cote, NP Date: 10/31/2020 Duration: 23.50 mins  Patient history: 60 year old male with altered mental status.  EEG to evaluate for seizure.  Level of alertness:  lethargic   AEDs during EEG study: None  Technical aspects: This EEG study was done with scalp electrodes positioned according to the 10-20 International system of electrode placement. Electrical activity was acquired at a sampling rate of 500Hz  and reviewed with a high frequency filter of 70Hz  and a low frequency filter of 1Hz . EEG data were recorded continuously and digitally stored.   Description: No posterior dominant rhythm was seen. EEG showed continuous generalized 3 to 6 Hz theta-delta slowing. Hyperventilation and photic stimulation were not performed.     ABNORMALITY - Continuous slow, generalized  IMPRESSION: This study is suggestive of moderate diffuse encephalopathy, nonspecific etiology. No seizures or epileptiform discharges were seen throughout the recording.  Ronald Murphy

## 2020-10-31 NOTE — Progress Notes (Signed)
[place] for heparin infusion Indication: ACS/STEMI  No Known Allergies  Patient Measurements: Height: 5\' 3"  (160 cm) Weight: 72.8 kg (160 lb 7.9 oz) IBW/kg (Calculated) : 56.9 Heparin Dosing Weight: 71.6 kg  Vital Signs: Temp: 96.8 F (36 C) (10/18 1130) Temp Source: Axillary (10/18 0734) BP: 94/61 (10/18 1600) Pulse Rate: 84 (10/18 1600)  Labs: Recent Labs    10/30/20 2211 10/31/20 0152 10/31/20 0421 10/31/20 0539 10/31/20 0859 10/31/20 1200  HGB 13.7  --  11.3*  --   --   --   HCT 39.6  --  33.0*  --   --   --   PLT 165  --  139*  --   --   --   APTT 40*  --   --   --   --   --   LABPROT 21.5*  --   --   --   --   --   INR 1.9*  --   --   --   --   --   HEPARINUNFRC  --   --   --   --  <0.10*  --   CREATININE 3.64*  --  3.16*  --   --   --   CKTOTAL 16,180*  --  12,478*  --   --   --   TROPONINIHS 17,932*   < > 16,665* 17,317* 09,326* 11,936*   < > = values in this interval not displayed.     Estimated Creatinine Clearance: 22 mL/min (A) (by C-G formula based on SCr of 3.16 mg/dL (H)).   Medical History: Past Medical History:  Diagnosis Date   Alcoholism in remission ([place])    Anxiety    Depression    Hepatitis C    Hypertension    Kidney stone    Larynx cancer ([place]) 04/05/2017   Stage 3    Assessment: 60 y/o male with h/o SCC lung, laryngeal cancer s/p chemotherapy/XRT, radiation induced esophageal stricture s/p dilation, chronic trach, recurrent aspiration PNA, Hep C, anxiety, depression, etoh abuse (remission) and HTN who is admitted with lacunar infarct, CAP, septic shock and NSTEMI. Pharmacy consulted for heparin monitoring. He is being followed by cardiology with plans to continue heparin drip for 48 hours. H&H, platelets noted to be trending down slightly.   Goal of Therapy:  Heparin level 0.3-0.7 units/ml Monitor platelets by anticoagulation protocol: Yes   Plan:  Heparin Level remains subtherapeutic in  spite of previous rate increase heparin 2100 unit IV bolus then Increase heparin infusion rate to 1300 units/hr Recheck heparin level 8 hours after rate change CBC daily while on heparin  Dallie Piles, PharmD, BCPS Clinical Pharmacist 10/31/2020 4:50 PM

## 2020-10-31 NOTE — H&P (Signed)
NAME:  Ronald Murphy, MRN:  [id number], DOB:  01-Dec-1960, LOS: 0 ADMISSION DATE:  10/30/2020, CONSULTATION DATE:  10/31/2020 REFERRING MD:  Dr. Beather Arbour, CHIEF COMPLAINT:  Altered Mental Status   History of Present Illness:  60 year old male with PMHx of laryngeal cancer status post tracheostomy presented to [facility] ED from home via EMS on 10/30/2020 due to his son's concern of altered mental status.  Per his son's report he last saw his father well around 1 PM on Monday, 10/30/2020.  His father lives with his wife, for whom he is caretaker, as she has dementia and is very hard of hearing.  When his son returned from work after 8 PM on Monday, 10/30/2020 his father appeared altered leaning to his right side, unable to follow commands and unable to speak to him. Per his son his father is ambulatory, alert and oriented x4 and able to care for himself.  Per his son's report patient does have a history of alcoholism but currently only drinks nonalcoholic beer.  He is not aware of any previous stroke history. ED course: Per ED documentation on arrival patient was sluggish and would occasionally follow commands but not answering any questions.  The patient will look at you when you talk to him, but is not verbal. Medications given: 10 units of insulin and D50 given for hyperkalemia.  Heparin infusion started for NSTEMI Initial Vitals: T 99.1, RR 17, HR 93, BP 116/61 and SPO2 100% on trach collar at 10 L Significant labs: (Labs/ Imaging personally reviewed) I, Ronald Murphy, AGACNP-BC, personally viewed and interpreted this ECG. EKG Interpretation: Date: 10/31/2019, EKG Time: 22:11, Rate: 102, Rhythm: Sinus tachycardia, QRS Axis: Normal, Intervals: Normal, ST/T Wave abnormalities: None, Narrative Interpretation: Sinus tachycardia Chemistry: Na+: 129, K+: 6.3, BUN/Cr.:  34/3.64, Serum CO2/ AG: 26/13 Severe Transaminitis: AST 7291 ALT 2441 ammonia 51 total bilirubin 1.4 Hematology: WBC: 9.9, Hgb: 13.7,   Troponin: 17,932, Lactic/ PCT: 2.8/1.73, COVID-19 & Influenza A/B: Negative ABG: 7.31/51/169/25.7 CXR 10/30/2020: Diffuse hazy opacity in the left thorax which may be secondary to pneumonia CT head 10/30/2020: Bilateral basal ganglia lacunar infarcts age-indeterminate but favored chronic CT abdomen pelvis without contrast 10/30/2020: No evidence of bowel obstruction.  Sigmoid diverticulosis without evidence of diverticulitis.  Multiple nonobstructing right lower pole renal calculi measuring up to 7 mm, no hydronephrosis.  Cholelithiasis without cholecystitis.  PCCM consulted for admission due to complexity of patient with chronic tracheostomy requiring mechanical ventilatory support for respiratory acidosis and hypercapnia.  Pertinent  Medical History  Laryngeal cancer status post chronic tracheostomy Anxiety and depression Hepatitis C Hypertension Alcoholism in remission Kidney stones  Significant Hospital Events: Including procedures, antibiotic start and stop dates in addition to other pertinent events   10/31/20: Patient admitted to ICU with chronic tracheostomy requiring mechanical ventilatory support.  Complex patient with severe Transaminitis, possible sepsis, NSTEMI, acute kidney injury with severe hyperkalemia, and acute altered mental status.  Interim History / Subjective:  Patient alert, focuses on speaker when his first name is called > at 1 point mouthing his first name [name].  Otherwise nonverbal, unable to consistently follow commands.  At 1 point he was able to give me a thumbs up with direction on right hand but not left. Wife bedside  Objective   Blood pressure 109/74, pulse 92, temperature 99.1 F (37.3 C), temperature source Oral, resp. rate 12, height 5\' 3"  (1.6 m), weight 72.8 kg, SpO2 100 %.    FiO2 (%):  [100 %] 100 %  No intake or output data in the 24 hours ending 10/31/20 0200 Filed Weights   10/30/20 2212  Weight: 72.8 kg    Examination: General: Adult  male, critically ill, lying in bed, NAD HEENT: MM pink/moist, anicteric, atraumatic, neck supple Neuro: Alert to self, nonverbal, follows intermittent simple commands, PERRL +3, MAE-generalized weakness CV: s1s2 RRR, sinus tachycardia on monitor, no r/m/g Pulm: Regular, non labored on trach collar 10 L, breath sounds diminished throughout GI: soft, rounded, non tender, bs x 4 GU: foley in place with clear yellow urine Skin: Burn noted on left ear, DTI on left hip/right knee/multiple areas on feet   Extremities: warm/dry, pulses + 2 R/P, no edema noted  Resolved Hospital Problem list     Assessment & Plan:   N-STEMI PMHx: Hypertension   - Trend troponins  - Echocardiogram ordered - heparin drip per pharmacy consult - continuous cardiac monitoring - Cardiology consulted, appreciate input  Suspected sepsis with septic shock due to suspected CAP Lactic: 2.8> 1.7, Baseline PCT: 1.73, UA: Negative, CXR: Diffuse hazy opacity in the left thorax possible for pneumonia, CT abdomen pelvis: Cholelithiasis without cholecystitis, renal calculi without hydronephrosis Initial interventions/workup included: 1 L of NS & ceftriaxone and azithromycin - Supplemental oxygen as needed, to maintain SpO2 > 90% - f/u cultures, trend lactic/ PCT - Daily CBC - monitor WBC/ fever curve - IV antibiotics: Ceftriaxone and azithromycin - IVF hydration as needed: Additional liter of normal saline bolus followed by 100 and mils an hour of NS -Initiate peripheral vasopressors to maintain MAP< 65, norepinephrine - Strict I/O's: alert provider if UOP < 0.5 mL/kg/hr - Persistent hypotension consider stress dose steroids   Acute on chronic hepatitis C in the setting of NSTEMI & rhabdomyolysis PMHx: Hepatitis C, alcoholism in remission AST 7291, ALT 2441, ammonia 51, total bilirubin 1.4 (liver enzymes normal August 2022).  CT abdomen pelvis: Cholelithiasis without cholecystitis - Trend hepatic function, follow-up  hepatitis panel - Lactulose twice daily - avoid hepatotoxic agents - consult GI as needed  Acute Hypoxic / Hypercapnic Respiratory Failure secondary to suspected CAP in the setting of acute metabolic encephalopathy  Chronic tracheostomy - Ventilator settings: PRVC  8 mL/kg, 40% FiO2, 5 PEEP, continue ventilator support & lung protective strategies - Wean PEEP & FiO2 as tolerated, maintain SpO2 > 90% - Head of bed elevated 30 degrees,  - Intermittent chest x-ray & ABG PRN - Ensure adequate pulmonary hygiene  - F/u cultures, trend PCT - Continue CAP coverage ceftriaxone and azithromycin - Bronchodilators PRN  Rhabdomyolysis Acute Kidney Injury  Hyperkalemia: 6.3 Baseline Cr: 0.7, Cr on admission: 3.64 Patient received 10 units of insulin and D50 for hyperkalemia -Stat follow-up potassium -Lokelma p.o. - Strict I/O's: alert provider if UOP < 0.5 mL/kg/hr - IVF hydration additional liter bolus ordered followed by continuous IV fluid at 100 and mils an hour - Daily BMP, replace electrolytes PRN - Avoid nephrotoxic agents as able, ensure adequate renal perfusion - Consult nephrology if iHD or CRRT indicated   Acute encephalopathy Bilateral basal ganglia lacunar infarct-new since May 2022 Infarct suspected chronic in nature, due to son's story of acute change in mental status and difficulty with balance.  Stat telemetry neuro consult obtained to look over imaging and receive recommendations as patient is receiving high-dose heparin for N-STEMI.  Per telemetry neurologist infarcts appear chronic on imaging, follow-up EEG recommended. -EEG ordered -Frequent neuro checks -Lipid panel, hemoglobin A1c, thyroid panel ordered -Echo with bubble study ordered -SLP/PT/OT consult once patient  is more interactive -MRI brain/MRA head without contrast and MRI neck with contrast ordered -Neuro consulted, appreciate input  Pressure injuries on hip/knee/feet Burn on left ear -Supportive care -Turn  every 2 hours, range of motion every 4 hours -Wound care consult  Best Practice (right click and "Reselect all SmartList Selections" daily)  Diet/type: NPO w/ meds via tube DVT prophylaxis: systemic heparin GI prophylaxis: PPI Lines: N/A Foley:  Yes, and it is still needed Code Status:  full code Last date of multidisciplinary goals of care discussion [10/31/20]  Labs   CBC: Recent Labs  Lab 10/30/20 2211  WBC 9.9  HGB 13.7  HCT 39.6  MCV 88.8  PLT 709    Basic Metabolic Panel: Recent Labs  Lab 10/30/20 2211  NA 129*  K 6.3*  CL 90*  CO2 26  GLUCOSE 106*  BUN 34*  CREATININE 3.64*  CALCIUM 8.5*   GFR: Estimated Creatinine Clearance: 19.1 mL/min (A) (by C-G formula based on SCr of 3.64 mg/dL (H)). Recent Labs  Lab 10/30/20 2211  WBC 9.9  LATICACIDVEN 2.8*    Liver Function Tests: Recent Labs  Lab 10/30/20 2211  AST 7,291*  ALT 2,441*  ALKPHOS 91  BILITOT 1.4*  PROT 8.0  ALBUMIN 2.9*   No results for input(s): LIPASE, AMYLASE in the last 168 hours. Recent Labs  Lab 10/31/20 0006  AMMONIA 51*    ABG    Component Value Date/Time   PHART 7.31 (L) 10/31/2020 0108   PCO2ART 51 (H) 10/31/2020 0108   PO2ART 169 (H) 10/31/2020 0108   HCO3 25.7 10/31/2020 0108   ACIDBASEDEF 1.1 10/31/2020 0108   O2SAT 99.4 10/31/2020 0108     Coagulation Profile: Recent Labs  Lab 10/30/20 2211  INR 1.9*    Cardiac Enzymes: Recent Labs  Lab 10/30/20 2211  CKTOTAL 16,180*    HbA1C: No results found for: HGBA1C  CBG: Recent Labs  Lab 10/30/20 2359  GLUCAP 119*    Review of Systems:   UTA- patient altered and unable to participate in interview  Past Medical History:  He,  has a past medical history of Alcoholism in remission ([place]), Anxiety, Depression, Hepatitis C, Hypertension, Kidney stone, and Larynx cancer ([place]) (04/05/2017).   Surgical History:   Past Surgical History:  Procedure Laterality Date   SPINE SURGERY  1991     Social  History:   reports that he quit smoking about 7 years ago. He has never used smokeless tobacco. He reports that he does not currently use alcohol. He reports that he does not currently use drugs after having used the following drugs: Marijuana.   Family History:  His family history is not on file. He was adopted.   Allergies No Known Allergies   Home Medications  Prior to Admission medications   Medication Sig Start Date End Date Taking? Authorizing Provider  clonazePAM (KLONOPIN) 0.5 MG tablet Take 0.5 mg by mouth 2 (two) times daily as needed for anxiety.    [provider]  escitalopram (LEXAPRO) 20 MG tablet Take 20 mg by mouth daily.    [provider]  gabapentin (NEURONTIN) 300 MG capsule Take 300 mg by mouth at bedtime.    [provider]  ondansetron (ZOFRAN) 8 MG tablet Take by mouth every 8 (eight) hours as needed for nausea or vomiting.    [provider]  oxycodone (OXY-IR) 5 MG capsule Take 5 mg by mouth every 4 (four) hours as needed.    [provider]  prochlorperazine (COMPAZINE) 10 MG tablet Take 10 mg by mouth every 6 (six) hours as needed for nausea or vomiting.    [provider]  tamsulosin (FLOMAX) 0.4 MG CAPS capsule Take 0.4 mg by mouth daily.    [provider]     Critical care time: 70 minutes       Venetia Night, AGACNP-BC Acute Care Nurse Practitioner [facility]   [phone] / [phone] Please see Amion for pager details.

## 2020-10-31 NOTE — Consult Note (Addendum)
[facility] Consultation Note  Patient ID: Ronald Murphy, MRN: [id number], DOB/AGE: 07/14/1960 60 y.o. Admit date: 10/30/2020   Date of Consult: 10/31/2020 Primary Physician: Pcp, No Primary Cardiologist: none  Chief Complaint:  Chief Complaint  Patient presents with  . Altered Mental Status    unresponsive  . Tachycardia   Reason for Consult: HPI     Altered Mental Status    Additional comments: unresponsive      Last edited by Phill Myron, RN on 10/30/2020 10:08 PM.    NSTEMI, elevated troponin  HPI: 60 y.o. male with a past medical history of laryngeal cancer status post tracheostomy who presented to the ED with concerns of altered mental status per the patient's son.  Patient was last seen well around 1 PM on Monday, 10/30/2020.  Patient's son had described the patient leaning to his right side and unable to follow commands or speak.  Patient is normally ambulatory, alert and oriented and able to care for himself.  Initial labs in the ED showed a troponin of 17,000 with severe transaminitis, acute kidney injury.  Chest x-ray showed diffuse hazy opacity in the left thorax.  CT of the head showed bilateral basal ganglia lacunar infarcts likely chronic.  Patient is currently still unable to converse.  He does track people around the room with his eyes but has remained nonverbal at this time.  Initial troponin of 17,932 trending upward to a peak of 19,822 now beginning to trend downward currently at [place].  Patient also noted to have an acute kidney injury with creatinine of 3.16 and a GFR 22.  EKG showed sinus tachycardia at 102 bpm.  Past Medical History:  Diagnosis Date  . Alcoholism in remission ([place])   . Anxiety   . Depression   . Hepatitis C   . Hypertension   . Kidney stone   . Larynx cancer ([place]) 04/05/2017   Stage 3      Surgical History:  Past Surgical History:  Procedure Laterality Date  . SPINE SURGERY  1991     Home Meds: Prior to  Admission medications   Medication Sig Start Date End Date Taking? Authorizing Provider  clonazePAM (KLONOPIN) 0.5 MG tablet Take 0.5 mg by mouth 2 (two) times daily as needed for anxiety.    [provider]  escitalopram (LEXAPRO) 20 MG tablet Take 20 mg by mouth daily.    [provider]  gabapentin (NEURONTIN) 300 MG capsule Take 300 mg by mouth at bedtime.    [provider]  ondansetron (ZOFRAN) 8 MG tablet Take by mouth every 8 (eight) hours as needed for nausea or vomiting.    [provider]  oxycodone (OXY-IR) 5 MG capsule Take 5 mg by mouth every 4 (four) hours as needed.    [provider]  prochlorperazine (COMPAZINE) 10 MG tablet Take 10 mg by mouth every 6 (six) hours as needed for nausea or vomiting.    [provider]  tamsulosin (FLOMAX) 0.4 MG CAPS capsule Take 0.4 mg by mouth daily.    [provider]    Inpatient Medications:  . budesonide (PULMICORT) nebulizer solution  0.5 mg Nebulization BID  . chlorhexidine gluconate (MEDLINE KIT)  15 mL Mouth Rinse BID  . free water  30 mL Per Tube Q4H  . ipratropium-albuterol  3 mL Nebulization Q6H  . lactulose  30 g Per Tube BID  . mouth rinse  15 mL Mouth Rinse 10 times per day  .  methylPREDNISolone (SOLU-MEDROL) injection  40 mg Intravenous Q24H  . [START ON 11/01/2020] nutrition supplement (JUVEN)  1 packet Per Tube BID BM  . pantoprazole (PROTONIX) IV  40 mg Intravenous QHS   . sodium chloride    . sodium chloride 100 mL/hr at 10/31/20 0600  . azithromycin    . cefTRIAXone (ROCEPHIN)  IV    . feeding supplement (VITAL AF 1.2 CAL)    . heparin 1,100 Units/hr (10/31/20 1142)  . norepinephrine (LEVOPHED) Adult infusion Stopped (10/31/20 1055)    Allergies: No Known Allergies  Social History   Socioeconomic History  . Marital status: Married    Spouse name: Not on file  . Number of children: Not on file  . Years of education: Not on file  . Highest  education level: Not on file  Occupational History  . Not on file  Tobacco Use  . Smoking status: Former    Types: Cigarettes    Quit date: 2015    Years since quitting: 7.8  . Smokeless tobacco: Never  Vaping Use  . Vaping Use: Never used  Substance and Sexual Activity  . Alcohol use: Not Currently    Comment: quit 2015, prior use 12-15 beverages / day  . Drug use: Not Currently    Types: Marijuana  . Sexual activity: Not Currently  Other Topics Concern  . Not on file  Social History Narrative  . Not on file   Social Determinants of Health   Financial Resource Strain: Not on file  Food Insecurity: Not on file  Transportation Needs: Not on file  Physical Activity: Not on file  Stress: Not on file  Social Connections: Not on file  Intimate Partner Violence: Not on file     Family History  Adopted: Yes     Review of Systems Patient unable to participate in review of systems  Labs: Recent Labs    10/30/20 2211 10/31/20 0421  CKTOTAL 16,180* 12,478*   Lab Results  Component Value Date   WBC 9.8 10/31/2020   HGB 11.3 (L) 10/31/2020   HCT 33.0 (L) 10/31/2020   MCV 88.5 10/31/2020   PLT 139 (L) 10/31/2020    Recent Labs  Lab 10/31/20 0421  NA 130*  K 5.0  CL 98  CO2 24  BUN 38*  CREATININE 3.16*  CALCIUM 7.5*  PROT 6.1*  BILITOT 0.9  ALKPHOS 74  ALT 2,410*  AST 4,293*  GLUCOSE 103*   Lab Results  Component Value Date   CHOL 115 10/31/2020   HDL 21 (L) 10/31/2020   [place] 75 10/31/2020   TRIG 95 10/31/2020   No results found for: DDIMER  Radiology/Studies:  CT Abdomen Pelvis Wo Contrast  Result Date: 10/31/2020 CLINICAL DATA:  Abdominal pain EXAM: CT ABDOMEN AND PELVIS WITHOUT CONTRAST TECHNIQUE: Multidetector CT imaging of the abdomen and pelvis was performed following the standard protocol without IV contrast. COMPARISON:  None. FINDINGS: Lower chest: Patchy opacities in the bilateral lower lungs, suspicious for pneumonia. Hepatobiliary:  Liver is within normal limits. Layering noncalcified gallstones versus gallbladder sludge (series 2/image 28), without associated inflammatory changes. No intrahepatic or extrahepatic duct dilatation. Pancreas: Within normal limits. Spleen: Within normal limits. Adrenals/Urinary Tract: Adrenal glands are within normal limits. Multiple nonobstructing right lower pole renal calculi measuring up to 7 mm (series 2/image 34). Left kidney is within normal limits. No hydronephrosis. Mildly thick-walled bladder. Stomach/Bowel: Stomach is within normal limits. No evidence of bowel obstruction. Normal appendix (series 2/image 56). Sigmoid diverticulosis, without  evidence of diverticulitis. Vascular/Lymphatic: No evidence of abdominal aortic aneurysm. Atherosclerotic calcifications of the abdominal aorta and branch vessels. No suspicious abdominopelvic lymphadenopathy. Reproductive: Prostate is unremarkable. Other: No abdominopelvic ascites. Tiny fat containing periumbilical hernia (series 2/image 48). Mild fat in the bilateral inguinal canals (series 2/image 76). Musculoskeletal: Degenerative changes of the visualized thoracolumbar spine. IMPRESSION: No evidence of bowel obstruction.  Normal appendix. Sigmoid diverticulosis, without evidence of diverticulitis. Multiple nonobstructing right lower pole renal calculi measuring up to 7 mm. No hydronephrosis. Cholelithiasis, without associated inflammatory changes. Electronically Signed   By: Julian Hy M.D.   On: 10/31/2020 00:18   CT HEAD WO CONTRAST (5MM)  Result Date: 10/30/2020 CLINICAL DATA:  Mental status change, unknown cause EXAM: CT HEAD WITHOUT CONTRAST TECHNIQUE: Contiguous axial images were obtained from the base of the skull through the vertex without intravenous contrast. COMPARISON:  None. FINDINGS: Brain: Low-density areas in the basal ganglia bilaterally compatible with acute infarcts, age indeterminate, favor chronic. No hemorrhage or hydrocephalus.  Vascular: No hyperdense vessel or unexpected calcification. Skull: No acute calvarial abnormality. Sinuses/Orbits: No acute findings Other: None IMPRESSION: Bilateral basal ganglia lacunar infarcts, age indeterminate but favor chronic. Electronically Signed   By: Rolm Baptise M.D.   On: 10/30/2020 23:58   DG Chest Portable 1 View  Result Date: 10/30/2020 CLINICAL DATA:  Hypoxia EXAM: PORTABLE CHEST 1 VIEW COMPARISON:  None. FINDINGS: Tracheostomy tube tip about 5.8 cm superior to carina. The right lung shows no focal airspace disease. The left lung shows mild diffuse ground-glass opacity. No pleural effusion. Normal cardiac size. No pneumothorax. IMPRESSION: Diffuse hazy opacity in the left thorax which may be secondary to pneumonia Electronically Signed   By: Donavan Foil M.D.   On: 10/30/2020 22:44    EKG: Sinus tachycardia at 102 bpm.  Weights: Filed Weights   10/30/20 2212  Weight: 72.8 kg     Physical Exam: Blood pressure 106/70, pulse 89, temperature (!) 96.7 F (35.9 C), temperature source Axillary, resp. rate 12, height 5' 3"  (1.6 m), weight 72.8 kg, SpO2 98 %. Body mass index is 28.43 kg/m. General: Critically ill, in no acute distress. Head eyes ears nose throat: Normocephalic, atraumatic, sclera non-icteric, no xanthomas, nares are without discharge. No apparent thyromegaly and/or mass  Lungs: Normal respiratory effort.  no wheezes, no rales, no rhonchi.  Heart: RRR with normal S1 S2. no murmur gallop, no rub, PMI is normal size and placement, carotid upstroke normal without bruit, jugular venous pressure is normal Abdomen: Soft, non-tender, non-distended with normoactive bowel sounds. No hepatomegaly. No rebound/guarding. No obvious abdominal masses. Abdominal aorta is normal size without bruit Extremities: No edema. no cyanosis, no clubbing, no ulcers  Peripheral : 2+ bilateral upper extremity pulses, 2+ bilateral femoral pulses, 2+ bilateral dorsal pedal pulse Neuro: Unable  to respond to questions or follow commands Psych:  unable to respond to questions    Assessment: Critically ill 60 year old male with a past medical history of laryngeal cancer status post tracheostomy tube placement presenting with pneumonia with severe hypoxic respiratory failure, elevated troponin secondary to NSTEMI complicated by multiorgan failure  Plan: -Continue to trend troponin -Continue heparin drip for 48 hours for management of suspected NSTEMI, then continue with single antiplatelet therapy thereafter -Continue current care and management for severe septic shock with community-acquired pneumonia, acute hypoxic respiratory failure, metabolic encephalopathy, multiorgan failure -Will await results of echocardiogram with bubble study to assess left ventricular function -We will continue with conservative management of NSTEMI at this time  due to concurrent multiorgan failure and current illness. Currently unable to tolerate any additional medical management due to acute illness and hypotension  Signed, Jettie Booze [facility] Cardiology 10/31/2020, 12:50 PM  The patient has been interviewed and examined. I agree with assessment and plan above. Serafina Royals MD [facility]

## 2020-10-31 NOTE — ED Provider Notes (Signed)
-----------------------------------------   12:05 AM on 10/31/2020 -----------------------------------------   Updated wife who is at bedside.  I personally reviewed patient's old labs from [facility] and the hepatorenal abnormalities are new.  Queried whether patient has been taking excessive acetaminophen; wife does not know.  Patient unable to answer any questions.  Obtain CT abdomen/pelvis given elevated liver enzymes.  Have also added hepatitis panel as well as ammonia.  CT head is negative for ICH; IV heparin will be started.  Anticipate hospitalization.   ----------------------------------------- 12:29 AM on 10/31/2020 -----------------------------------------  CT head unremarkable for ICH.  CT abdomen/pelvis demonstrates cholelithiasis without cholecystitis nor ductal dilatation.  Suggestion of pneumonia on chest x-ray; IV Rocephin and azithromycin ordered.  Will discuss with hospitalist services for admission.   ----------------------------------------- 12:40 AM on 10/31/2020 -----------------------------------------  Discussed case with hospital services; requesting to wait until ammonia results before evaluating patient for admission.   ----------------------------------------- 1:36 AM on 10/31/2020 -----------------------------------------   CCU was consulted and will admit patient given multisystem organ failure, hypercarbia requiring ventilator support.   Paulette Blanch, MD 10/31/20 [phone]

## 2020-11-01 ENCOUNTER — Inpatient Hospital Stay: Payer: Medicare HMO

## 2020-11-01 DIAGNOSIS — I214 Non-ST elevation (NSTEMI) myocardial infarction: Secondary | ICD-10-CM | POA: Diagnosis not present

## 2020-11-01 DIAGNOSIS — N179 Acute kidney failure, unspecified: Secondary | ICD-10-CM

## 2020-11-01 DIAGNOSIS — R778 Other specified abnormalities of plasma proteins: Secondary | ICD-10-CM | POA: Diagnosis not present

## 2020-11-01 DIAGNOSIS — E43 Unspecified severe protein-calorie malnutrition: Secondary | ICD-10-CM | POA: Insufficient documentation

## 2020-11-01 DIAGNOSIS — R4182 Altered mental status, unspecified: Secondary | ICD-10-CM | POA: Diagnosis not present

## 2020-11-01 DIAGNOSIS — G9341 Metabolic encephalopathy: Secondary | ICD-10-CM | POA: Diagnosis not present

## 2020-11-01 LAB — HEPARIN LEVEL (UNFRACTIONATED)
Heparin Unfractionated: 0.15 IU/mL — ABNORMAL LOW (ref 0.30–0.70)
Heparin Unfractionated: 0.33 IU/mL (ref 0.30–0.70)
Heparin Unfractionated: 0.23 IU/mL — ABNORMAL LOW (ref 0.30–0.70)

## 2020-11-01 LAB — COMPREHENSIVE METABOLIC PANEL
ALT: 1790 U/L — ABNORMAL HIGH (ref 0–44)
AST: 1490 U/L — ABNORMAL HIGH (ref 15–41)
Albumin: 2.3 g/dL — ABNORMAL LOW (ref 3.5–5.0)
Alkaline Phosphatase: 77 U/L (ref 38–126)
Anion gap: 7 (ref 5–15)
BUN: 58 mg/dL — ABNORMAL HIGH (ref 8–23)
CO2: 23 mmol/L (ref 22–32)
Calcium: 7.8 mg/dL — ABNORMAL LOW (ref 8.9–10.3)
Chloride: 102 mmol/L (ref 98–111)
Creatinine, Ser: 3.81 mg/dL — ABNORMAL HIGH (ref 0.61–1.24)
GFR, Estimated: 17 mL/min — ABNORMAL LOW (ref >60)
Glucose, Bld: 132 mg/dL — ABNORMAL HIGH (ref 70–99)
Potassium: 4.5 mmol/L (ref 3.5–5.1)
Sodium: 132 mmol/L — ABNORMAL LOW (ref 135–145)
Total Bilirubin: 0.6 mg/dL (ref 0.3–1.2)
Total Protein: 5.8 g/dL — ABNORMAL LOW (ref 6.5–8.1)

## 2020-11-01 LAB — HEMOGLOBIN A1C
Hgb A1c MFr Bld: 5.5 % (ref 4.8–5.6)
Mean Plasma Glucose: 111 mg/dL

## 2020-11-01 LAB — CBC WITH DIFFERENTIAL/PLATELET
Abs Immature Granulocytes: 0.05 10*3/uL (ref 0.00–0.07)
Basophils Absolute: 0 10*3/uL (ref 0.0–0.1)
Basophils Relative: 0 %
Eosinophils Absolute: 0 10*3/uL (ref 0.0–0.5)
Eosinophils Relative: 0 %
HCT: 33.1 % — ABNORMAL LOW (ref 39.0–52.0)
Hemoglobin: 11.4 g/dL — ABNORMAL LOW (ref 13.0–17.0)
Immature Granulocytes: 1 %
Lymphocytes Relative: 3 %
Lymphs Abs: 0.2 10*3/uL — ABNORMAL LOW (ref 0.7–4.0)
MCH: 29.8 pg (ref 26.0–34.0)
MCHC: 34.4 g/dL (ref 30.0–36.0)
MCV: 86.4 fL (ref 80.0–100.0)
Monocytes Absolute: 0.2 10*3/uL (ref 0.1–1.0)
Monocytes Relative: 3 %
Neutro Abs: 6.5 10*3/uL (ref 1.7–7.7)
Neutrophils Relative %: 93 %
Platelets: 154 10*3/uL (ref 150–400)
RBC: 3.83 MIL/uL — ABNORMAL LOW (ref 4.22–5.81)
RDW: 14.1 % (ref 11.5–15.5)
WBC: 7 10*3/uL (ref 4.0–10.5)
nRBC: 0 % (ref 0.0–0.2)

## 2020-11-01 LAB — GLUCOSE, CAPILLARY
Glucose-Capillary: 127 mg/dL — ABNORMAL HIGH (ref 70–99)
Glucose-Capillary: 133 mg/dL — ABNORMAL HIGH (ref 70–99)
Glucose-Capillary: 111 mg/dL — ABNORMAL HIGH (ref 70–99)
Glucose-Capillary: 118 mg/dL — ABNORMAL HIGH (ref 70–99)
Glucose-Capillary: 130 mg/dL — ABNORMAL HIGH (ref 70–99)

## 2020-11-01 LAB — THYROID PANEL WITH TSH
Free Thyroxine Index: 1.7 (ref 1.2–4.9)
T3 Uptake Ratio: 37 % (ref 24–39)
T4, Total: 4.6 ug/dL (ref 4.5–12.0)
TSH: 3.39 u[IU]/mL (ref 0.450–4.500)

## 2020-11-01 LAB — MAGNESIUM: Magnesium: 2.3 mg/dL (ref 1.7–2.4)

## 2020-11-01 LAB — AMMONIA: Ammonia: 34 umol/L (ref 9–35)

## 2020-11-01 LAB — PHOSPHORUS: Phosphorus: 4 mg/dL (ref 2.5–4.6)

## 2020-11-01 LAB — CK: Total CK: 5684 U/L — ABNORMAL HIGH (ref 49–397)

## 2020-11-01 IMAGING — MR MR HEAD W/O CM
12 series · 46 of 48 positions shown · non-contrast
Comparison: No prior MRI, correlation is made with CT head
[DATE]

CLINICAL DATA: Stroke, follow-up

EXAM:
MRI HEAD WITHOUT CONTRAST
MRA HEAD WITHOUT CONTRAST
TECHNIQUE: Multiplanar, multi-echo pulse sequences of the brain and surrounding
structures were acquired without intravenous contrast. Angiographic
images of the Circle of Willis were acquired using MRA technique
without intravenous contrast.

[Series 5: ax dwi_tracew · axial · 3.0mm · 0.65mm/px · z∈[-78,+73]mm · 5 of 48 slices shown (1 of 2)]
[im 1/48]
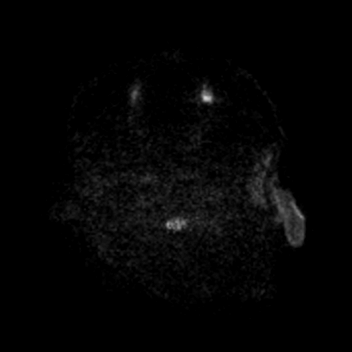
[im 12/48]
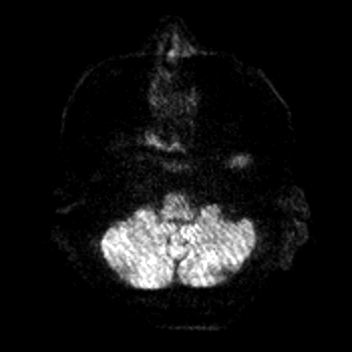
[im 24/48]
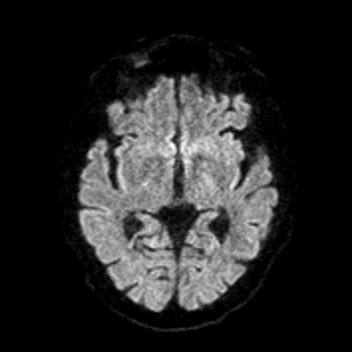
[im 36/48]
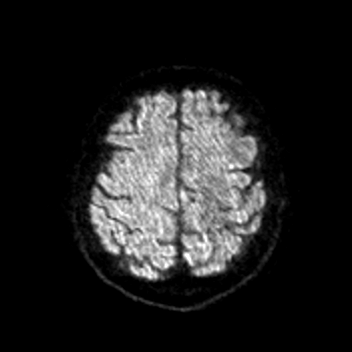
[im 48/48]
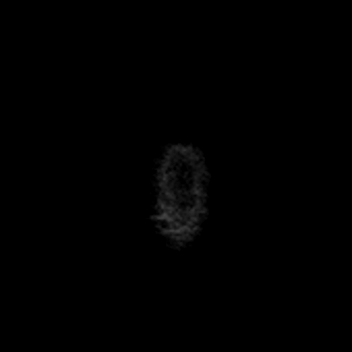

[Series 6: ax dwi_adc · axial · 3.0mm · 0.65mm/px · z∈[-78,+73]mm · 5 of 48 slices shown (1 of 2)]
[im 1/48]
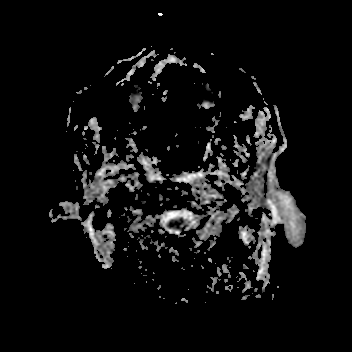
[im 12/48]
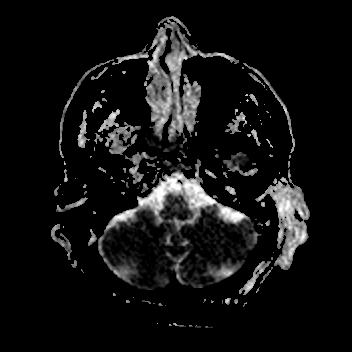
[im 24/48]
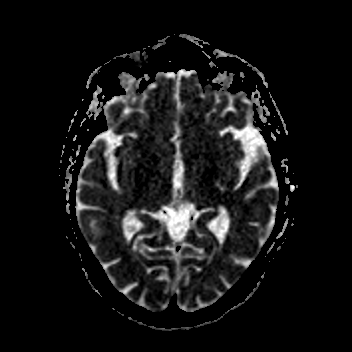
[im 36/48]
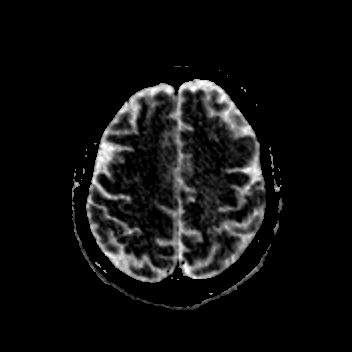
[im 48/48]
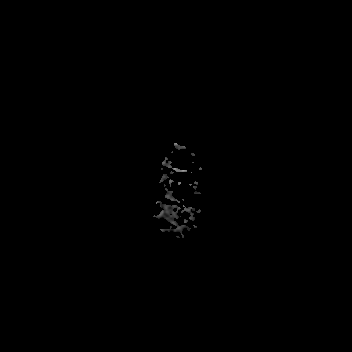

[Series 7: cor dwi_tracew · coronal · 5.0mm · 1.80mm/px · 4 of 38 slices shown]
[im 1/38]
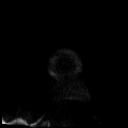
[im 13/38]
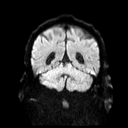
[im 25/38]
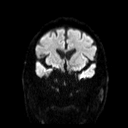
[im 38/38]
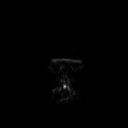

[Series 8: cor dwi_adc · coronal · 5.0mm · 1.80mm/px · 4 of 38 slices shown]
[im 1/38]
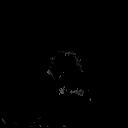
[im 13/38]
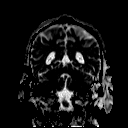
[im 25/38]
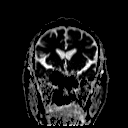
[im 38/38]
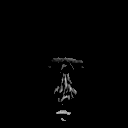

[Series 14: T1 · sagittal · 5.0mm · 0.62mm/px · 2 of 23 slices shown (1 of 2)]
[im 1/23]
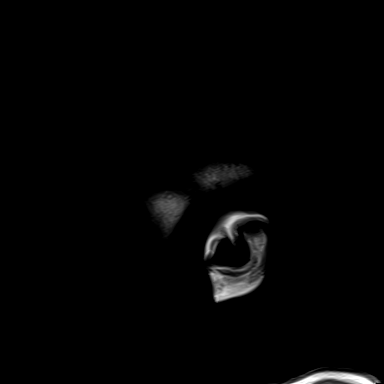
[im 23/23]
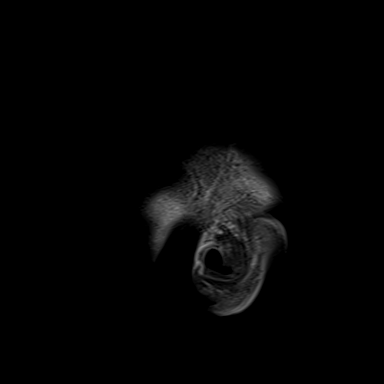

[Series 15: FLAIR · axial · 3.0mm · 0.69mm/px · z∈[-81,+74]mm · 6 of 54 slices shown]
[im 1/54]
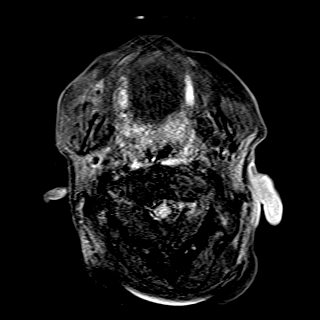
[im 11/54]
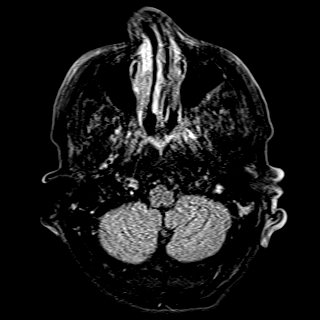
[im 22/54]
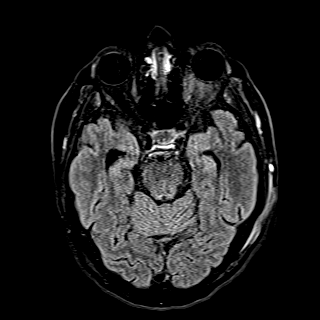
[im 32/54]
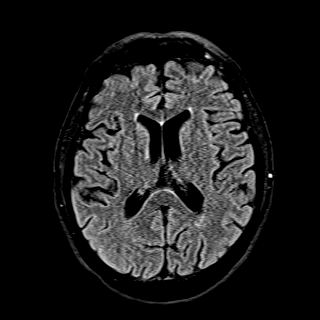
[im 43/54]
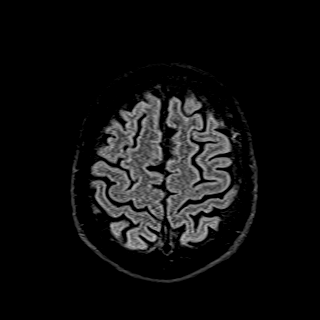
[im 54/54]
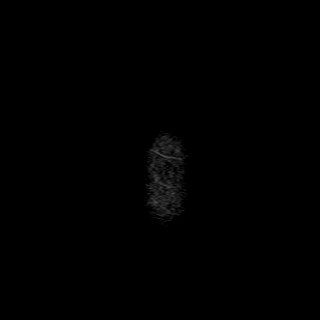

[Series 16: ax dwi_tracew · axial · 3.0mm · 1.31mm/px · z∈[-78,+73]mm · 5 of 48 slices shown (2 of 2)]
[im 1/48]
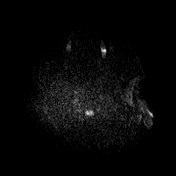
[im 12/48]
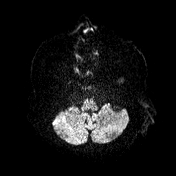
[im 24/48]
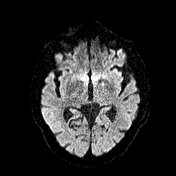
[im 36/48]
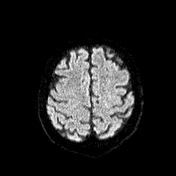
[im 48/48]
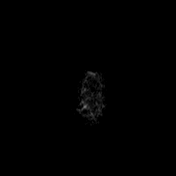

[Series 17: ax dwi_adc · axial · 3.0mm · 1.31mm/px · z∈[-78,+73]mm · 5 of 48 slices shown (2 of 2)]
[im 1/48]
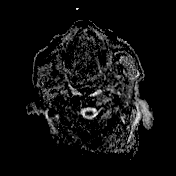
[im 12/48]
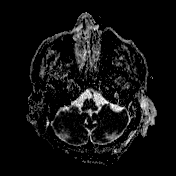
[im 24/48]
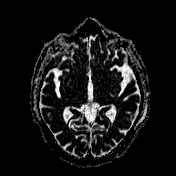
[im 36/48]
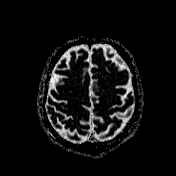
[im 48/48]
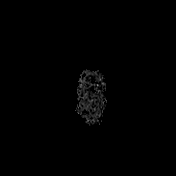

[Series 18: T2-star · axial · 5.0mm · 0.45mm/px · 1 of 27 slices shown]
[im 1/27]
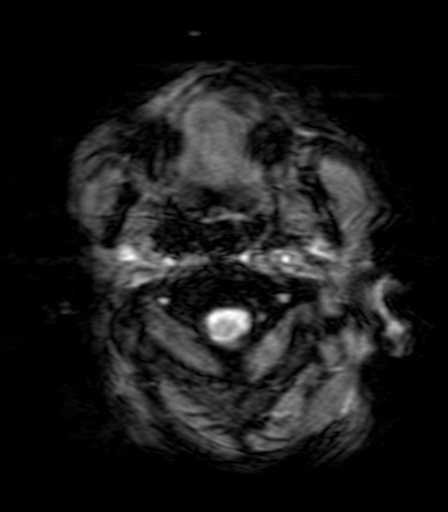

[Series 19: T1 · axial · 5.0mm · 0.90mm/px · z∈[-75,+77]mm · 3 of 27 slices shown (2 of 2)]
[im 1/27]
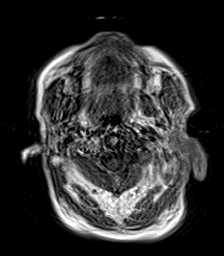
[im 14/27]
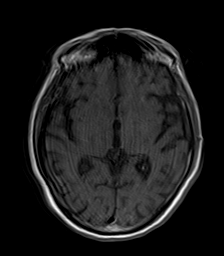
[im 27/27]
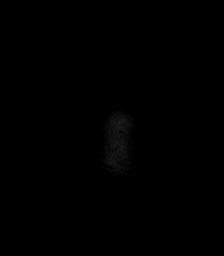

[Series 20: T2 · axial · 5.0mm · 0.45mm/px · z∈[-75,+77]mm · 3 of 27 slices shown (1 of 2)]
[im 1/27]
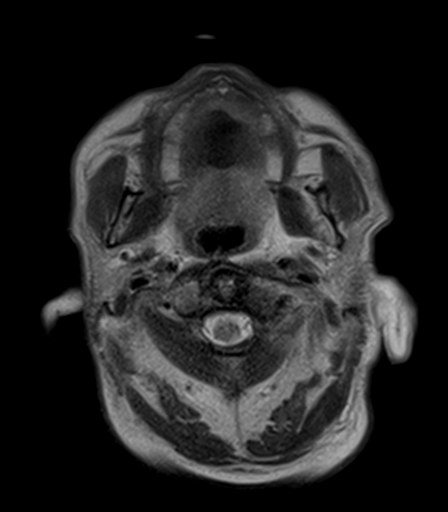
[im 14/27]
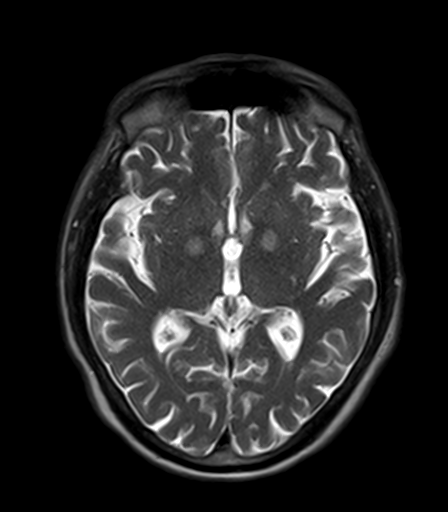
[im 27/27]
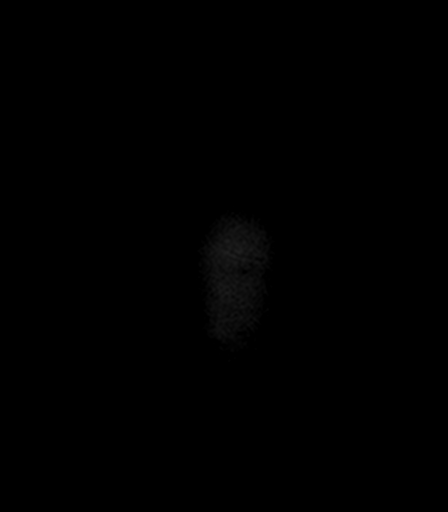

[Series 26: T2 · coronal · 5.0mm · 0.45mm/px · 3 of 31 slices shown (2 of 2)]
[im 1/31]
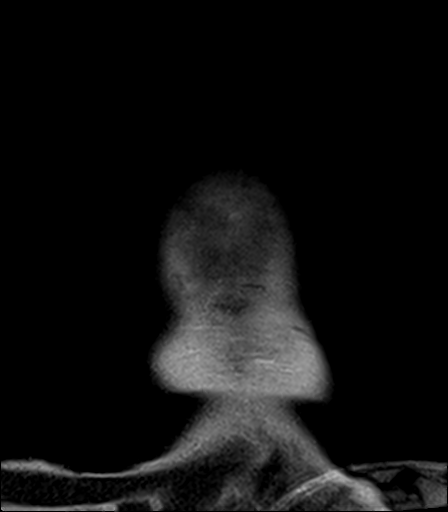
[im 16/31]
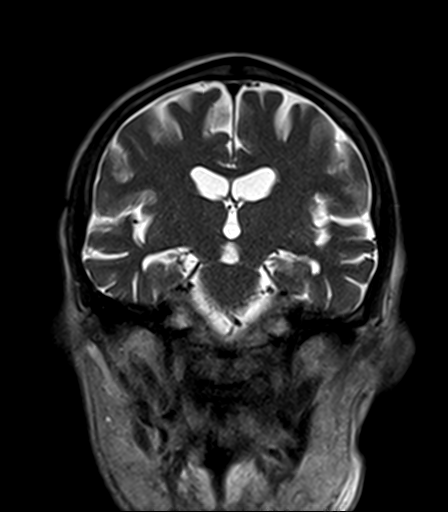
[im 31/31]
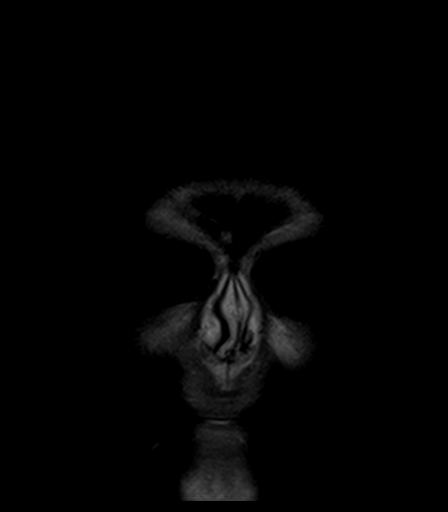

[46 of 48 positions shown; findings below may reference images not displayed]

FINDINGS: MRI HEAD FINDINGS

Brain: Symmetric focal areas of hyperintense signal on
diffusion-weighted imaging in the bilateral globi palladi (series 5,
image 26), without ADC correlate (series 6, image 26), and
associated with increased T2 signal (series 15, image 29), likely
subacute infarcts.

No acute hemorrhage or hemosiderin deposition to suggest remote
hemorrhage. No hydrocephalus, mass, mass effect, or midline shift.

Vascular: See MRI findings below.

Skull and upper cervical spine: Normal marrow signal.

Sinuses/Orbits: Mucosal thickening in the ethmoid air cells.
Otherwise negative.

Other: Fluid in the bilateral mastoid air cells.

MRA HEAD FINDINGS

Anterior circulation: Both internal carotid arteries are patent to
the termini, with irregularity in the left-greater-than-right
cavernous portions, likely related to atherosclerotic disease, but
without focal stenosis or occlusion. A1 segments patent. Normal
anterior communicating artery. Anterior cerebral arteries are patent
to their distal aspects. No M1 stenosis or occlusion. Normal MCA
bifurcations. Evaluation of the distal MCA branches is somewhat
limited by the degree of motion artifact on the slices, however no
focal occlusion is seen.

Posterior circulation: Vertebral arteries patent to the
vertebrobasilar junction without stenosis. Basilar patent to its
distal aspect. Superior cerebral arteries patent bilaterally. PCAs
perfused to their distal aspects without focal stenosis. Diminutive
left P1 with a patent left posterior communicating artery.

Anatomic variants: None significant.
IMPRESSION: 1. Focal, symmetric increased signal on diffusion-weighted imaging
in the bilateral globi palladi, without ADC correlate, and
associated with increased T2 signal. While these signal
characteristics can be seen in the setting of subacute ischemia, the
symmetry of these finding would be unusual for ischemia but can be
seen in the setting of carbon monoxide poisoning, with other
etiologies including mitochondrial or toxic encephalopathies.
2. Evaluation of the intracranial vasculature somewhat limited by
motion artifact. Within this limitation, no large vessel occlusion
or significant focal stenosis.

These results were called by telephone at the time of interpretation
on [DATE] at [DATE] to provider SABOGAL, who verbally
acknowledged these results.

## 2020-11-01 IMAGING — MR MR MRA HEAD W/O CM
2 series · 18 of 48 positions shown · non-contrast
Comparison: No prior MRI, correlation is made with CT head
[DATE]

CLINICAL DATA: Stroke, follow-up

EXAM:
MRI HEAD WITHOUT CONTRAST
MRA HEAD WITHOUT CONTRAST
TECHNIQUE: Multiplanar, multi-echo pulse sequences of the brain and surrounding
structures were acquired without intravenous contrast. Angiographic
images of the Circle of Willis were acquired using MRA technique
without intravenous contrast.

[Series 9: TOF · axial · 0.5mm · 0.41mm/px · z∈[-66,+25]mm · 10 of 205 slices shown (1 of 2)]
[im 9/205]
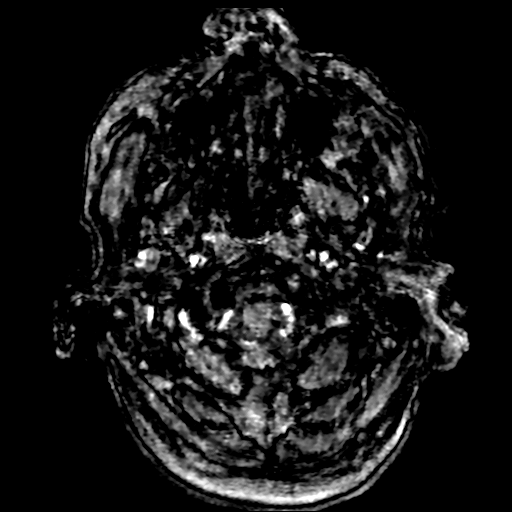
[im 36/205]
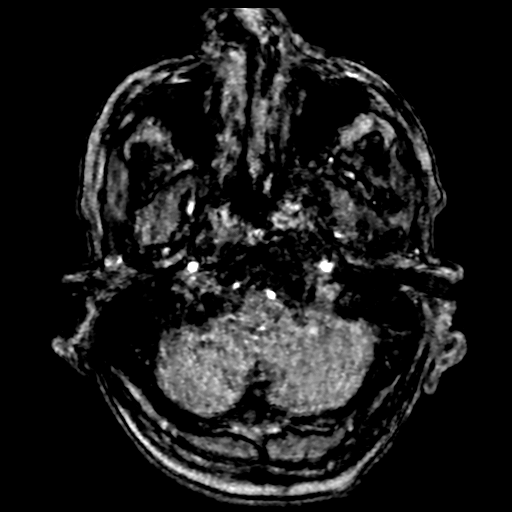
[im 63/205]
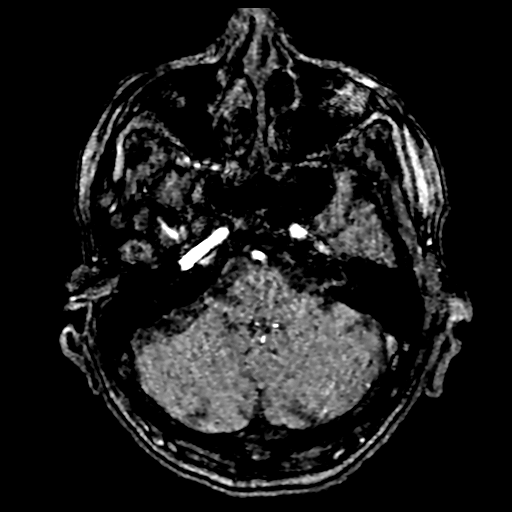
[im 89/205]
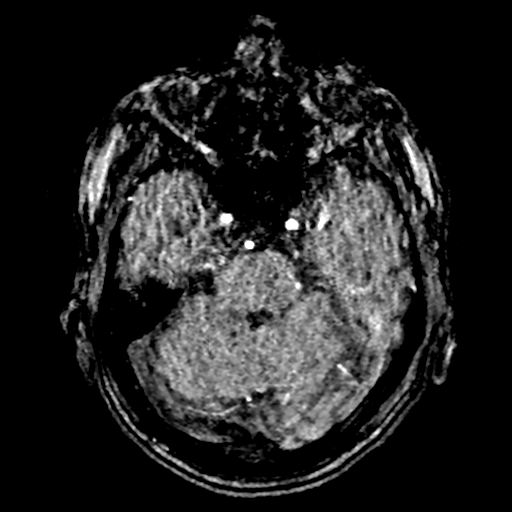
[im 107/205]
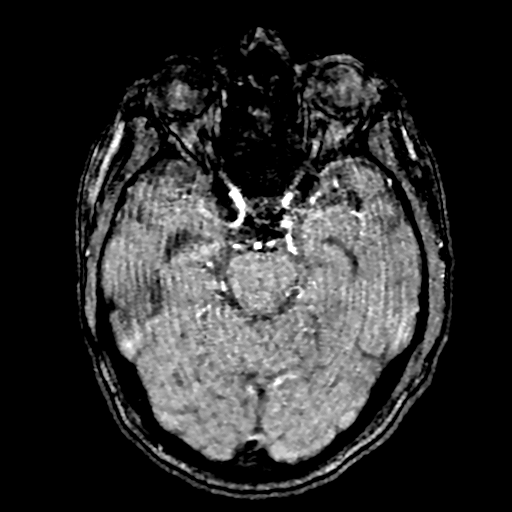
[im 116/205]
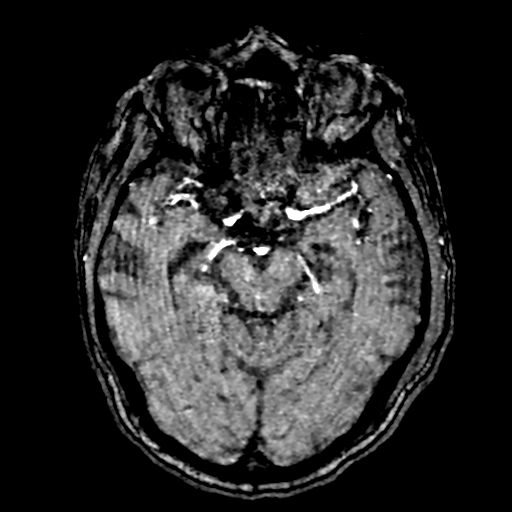
[im 142/205]
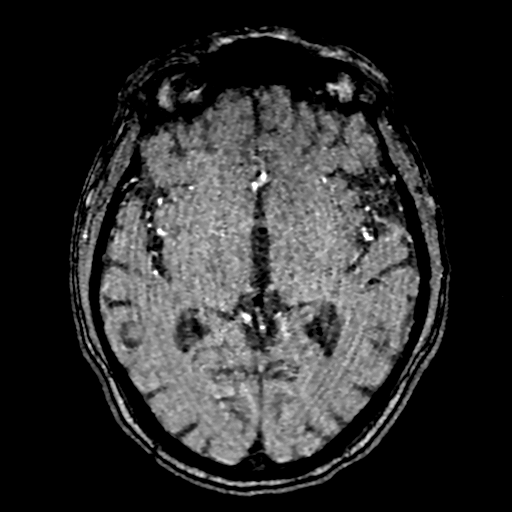
[im 169/205]
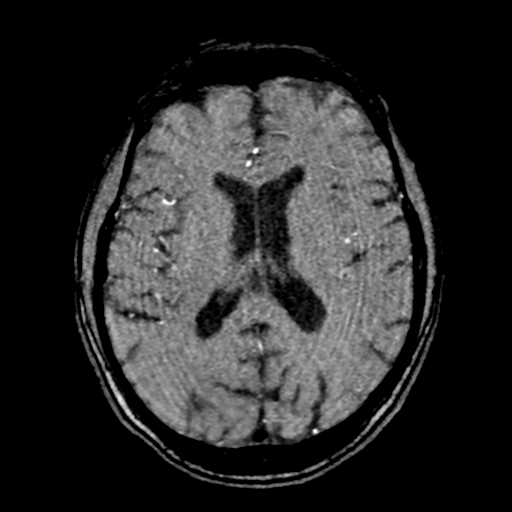
[im 178/205]
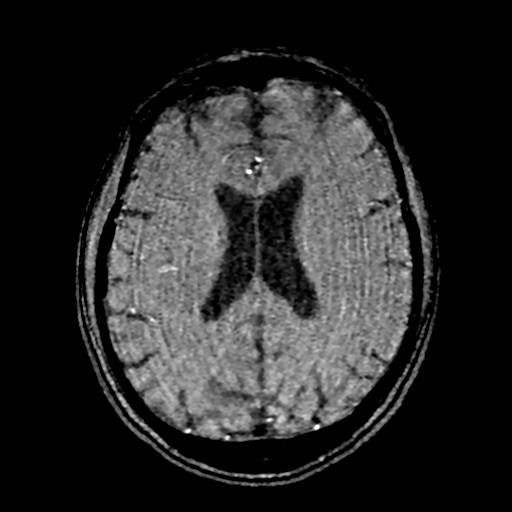
[im 196/205]
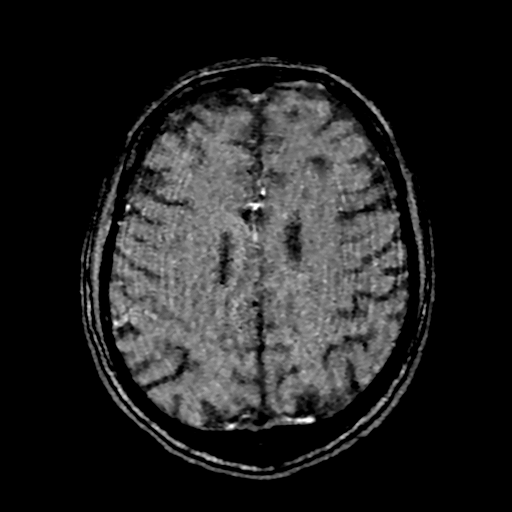

[Series 21: TOF · axial · 0.5mm · 0.41mm/px · z∈[-66,+16]mm · 8 of 205 slices shown (2 of 2)]
[im 9/205]
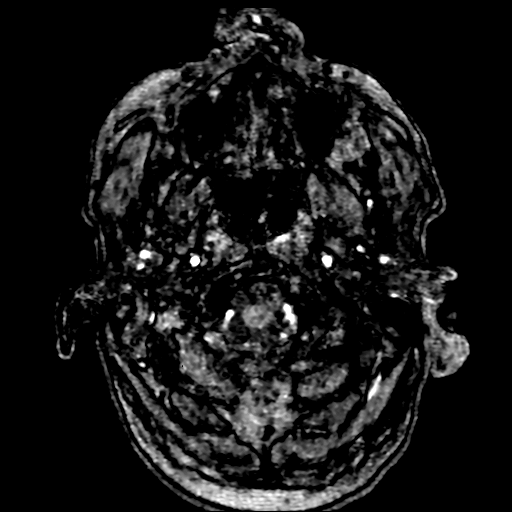
[im 36/205]
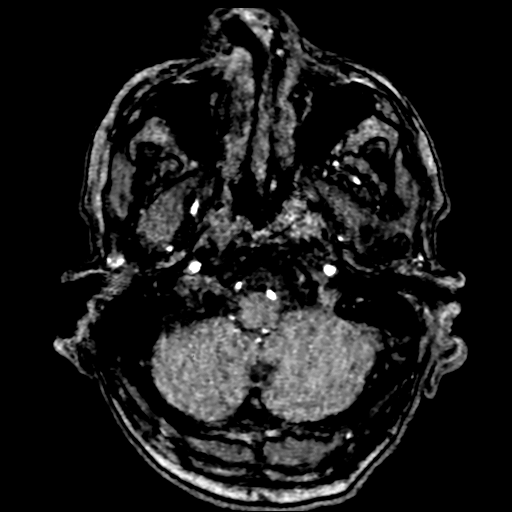
[im 63/205]
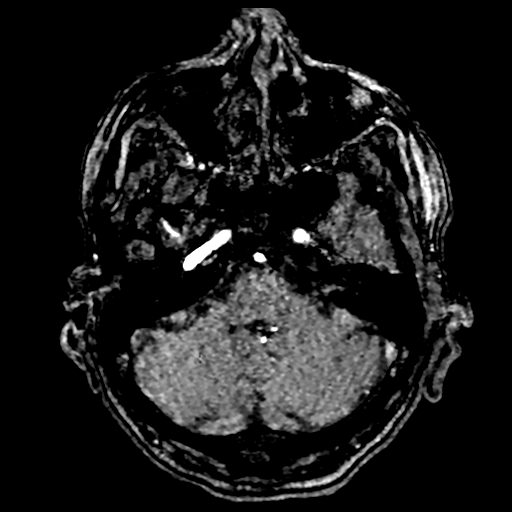
[im 89/205]
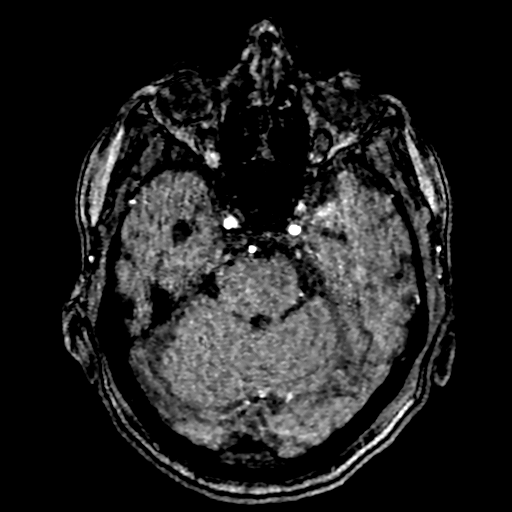
[im 107/205]
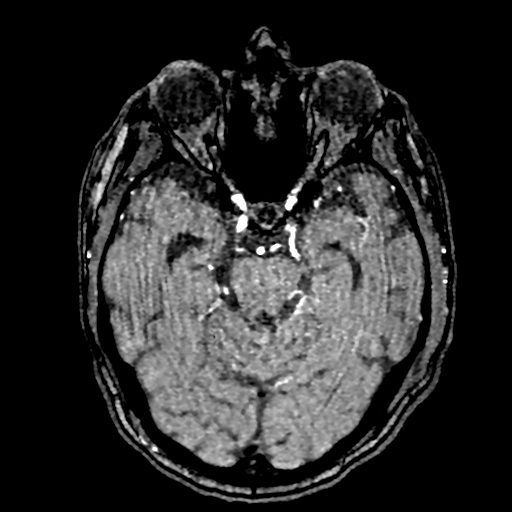
[im 116/205]
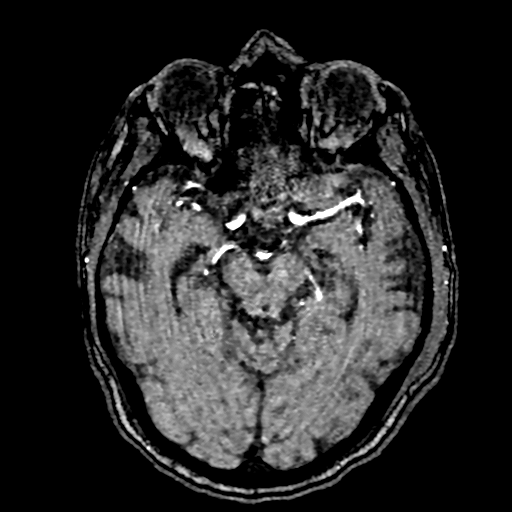
[im 142/205]
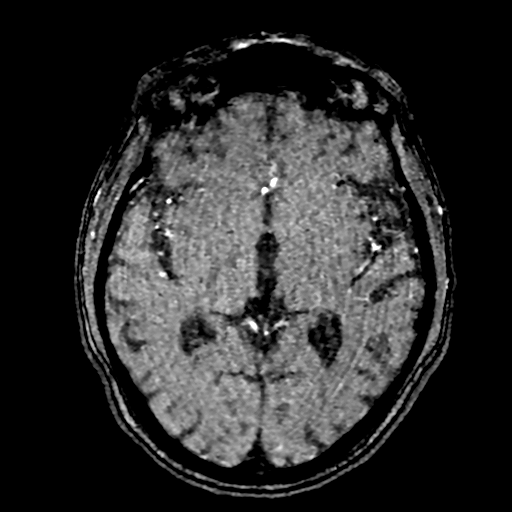
[im 178/205]
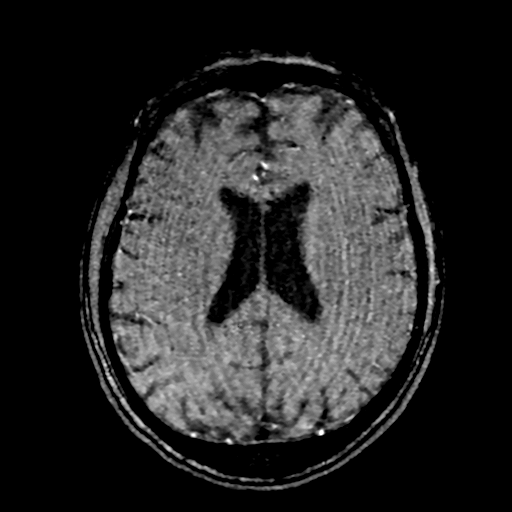

[18 of 48 positions shown; findings below may reference images not displayed]

FINDINGS: MRI HEAD FINDINGS

Brain: Symmetric focal areas of hyperintense signal on
diffusion-weighted imaging in the bilateral globi palladi (series 5,
image 26), without ADC correlate (series 6, image 26), and
associated with increased T2 signal (series 15, image 29), likely
subacute infarcts.

No acute hemorrhage or hemosiderin deposition to suggest remote
hemorrhage. No hydrocephalus, mass, mass effect, or midline shift.

Vascular: See MRI findings below.

Skull and upper cervical spine: Normal marrow signal.

Sinuses/Orbits: Mucosal thickening in the ethmoid air cells.
Otherwise negative.

Other: Fluid in the bilateral mastoid air cells.

MRA HEAD FINDINGS

Anterior circulation: Both internal carotid arteries are patent to
the termini, with irregularity in the left-greater-than-right
cavernous portions, likely related to atherosclerotic disease, but
without focal stenosis or occlusion. A1 segments patent. Normal
anterior communicating artery. Anterior cerebral arteries are patent
to their distal aspects. No M1 stenosis or occlusion. Normal MCA
bifurcations. Evaluation of the distal MCA branches is somewhat
limited by the degree of motion artifact on the slices, however no
focal occlusion is seen.

Posterior circulation: Vertebral arteries patent to the
vertebrobasilar junction without stenosis. Basilar patent to its
distal aspect. Superior cerebral arteries patent bilaterally. PCAs
perfused to their distal aspects without focal stenosis. Diminutive
left P1 with a patent left posterior communicating artery.

Anatomic variants: None significant.
IMPRESSION: 1. Focal, symmetric increased signal on diffusion-weighted imaging
in the bilateral globi palladi, without ADC correlate, and
associated with increased T2 signal. While these signal
characteristics can be seen in the setting of subacute ischemia, the
symmetry of these finding would be unusual for ischemia but can be
seen in the setting of carbon monoxide poisoning, with other
etiologies including mitochondrial or toxic encephalopathies.
2. Evaluation of the intracranial vasculature somewhat limited by
motion artifact. Within this limitation, no large vessel occlusion
or significant focal stenosis.

These results were called by telephone at the time of interpretation
on [DATE] at [DATE] to provider SABOGAL, who verbally
acknowledged these results.

## 2020-11-01 IMAGING — DX DG ABDOMEN 1V
1 series · 1 of 1 positions shown · non-contrast
Comparison: [DATE]

CLINICAL DATA: NG tube placement

EXAM:
ABDOMEN - 1 VIEW

[abdomen erect]
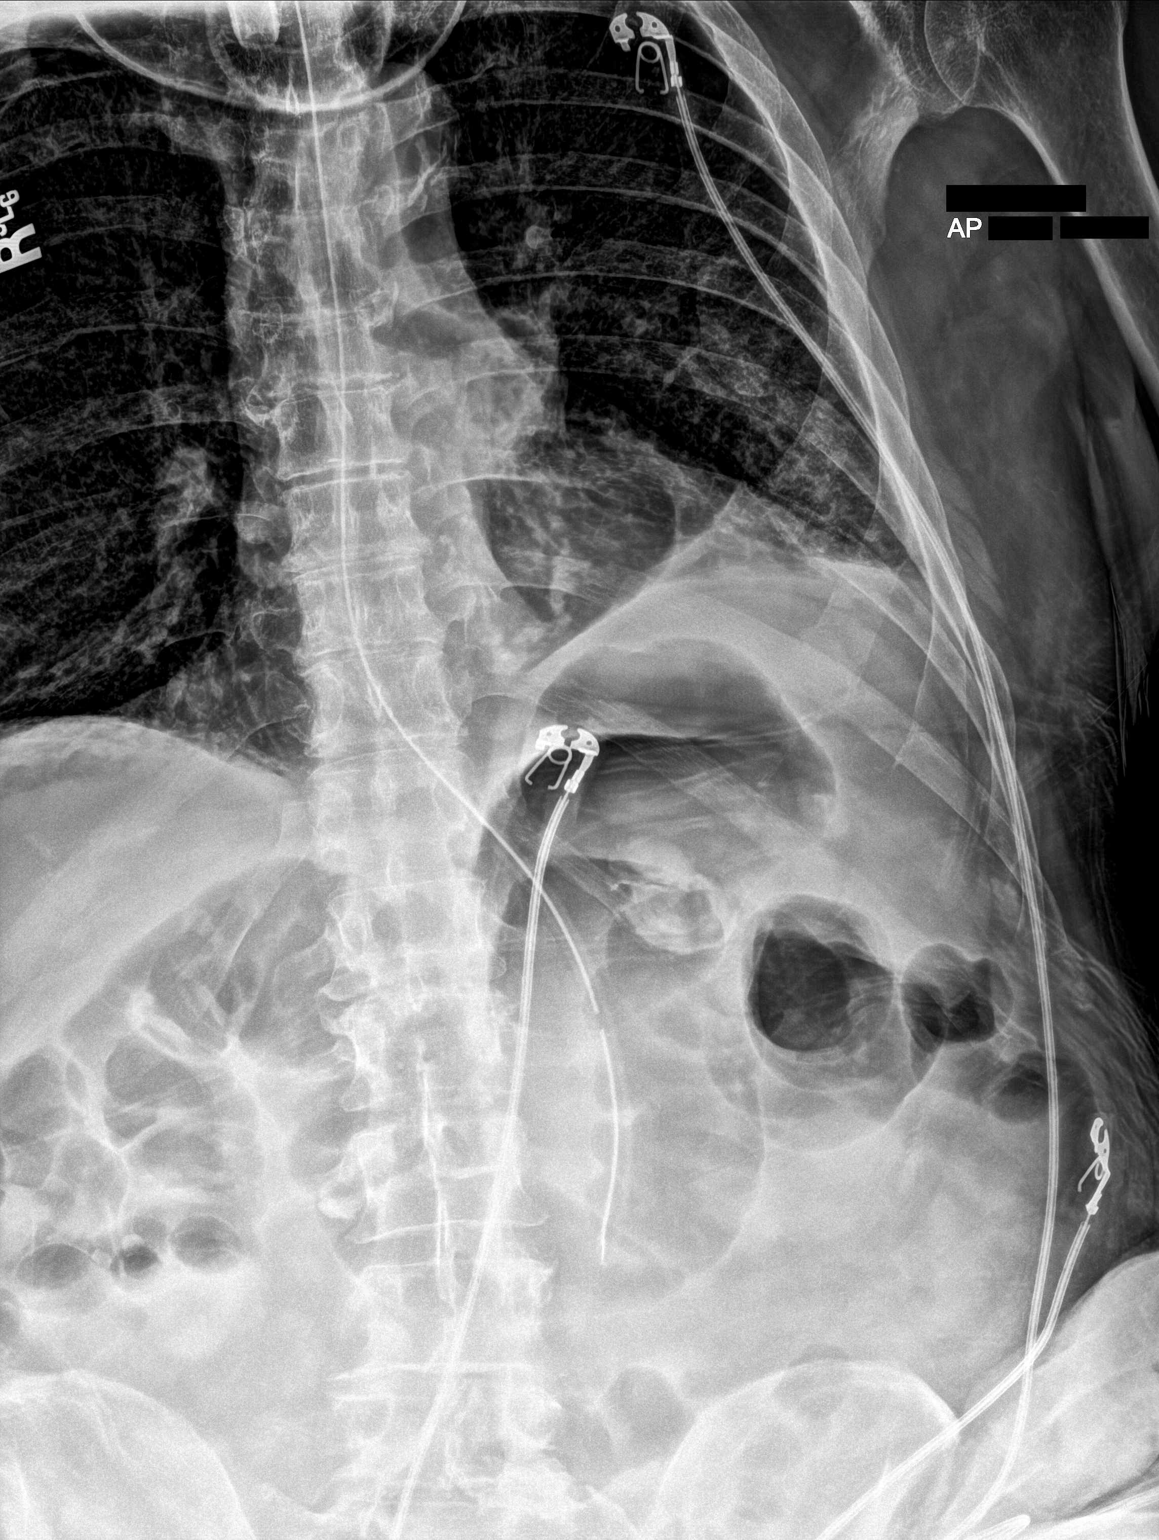

[1 of 1 positions shown; findings below may reference images not displayed]

FINDINGS: NG tube tip is in the stomach. Nonobstructive bowel gas pattern. No
visible free air. Left base atelectasis or infiltrate.
IMPRESSION: NG tube tip in the stomach.

## 2020-11-01 MED ORDER — OXYCODONE HCL 5 MG PO TABS: 15.0000 mg | ORAL_TABLET | ORAL | Status: DC | PRN

## 2020-11-01 MED ORDER — HEPARIN BOLUS VIA INFUSION
2100.0000 [IU] | Freq: Once | INTRAVENOUS | Status: AC
  Administered 2020-11-01: 2100 [IU] via INTRAVENOUS
  Filled 2020-11-01: qty 2100

## 2020-11-01 MED ORDER — CLONAZEPAM 0.5 MG PO TABS
0.5000 mg | ORAL_TABLET | Freq: Two times a day (BID) | ORAL | Status: DC | PRN
  Administered 2020-11-01: 0.5 mg via ORAL
  Filled 2020-11-01: qty 1

## 2020-11-01 MED ORDER — ALPRAZOLAM 0.5 MG PO TABS
1.0000 mg | ORAL_TABLET | Freq: Once | ORAL | Status: AC
  Administered 2020-11-01: 1 mg via ORAL
  Filled 2020-11-01: qty 2

## 2020-11-01 MED ORDER — MORPHINE SULFATE ER 15 MG PO TBCR
30.0000 mg | EXTENDED_RELEASE_TABLET | Freq: Three times a day (TID) | ORAL | Status: DC
  Administered 2020-11-01 (×2): 30 mg via ORAL
  Filled 2020-11-01 (×2): qty 2

## 2020-11-01 MED ORDER — ESCITALOPRAM OXALATE 20 MG PO TABS
20.0000 mg | ORAL_TABLET | Freq: Every day | ORAL | Status: DC
  Administered 2020-11-01: 20 mg via ORAL
  Filled 2020-11-01 (×2): qty 1

## 2020-11-01 MED ORDER — GABAPENTIN 300 MG PO CAPS
300.0000 mg | ORAL_CAPSULE | Freq: Every day | ORAL | Status: DC
  Administered 2020-11-01: 300 mg via ORAL
  Filled 2020-11-01: qty 1

## 2020-11-01 MED ORDER — METOPROLOL TARTRATE 25 MG PO TABS
25.0000 mg | ORAL_TABLET | Freq: Two times a day (BID) | ORAL | Status: DC
  Administered 2020-11-01 (×2): 25 mg
  Filled 2020-11-01 (×2): qty 1

## 2020-11-01 NOTE — Progress Notes (Signed)
Patient unable to complete MRI due to desating in the 70's when laying flat.

## 2020-11-01 NOTE — Progress Notes (Signed)
[facility] Encounter Note  Patient: Ronald Murphy / Admit Date: 10/30/2020 / Date of Encounter: 11/01/2020, 11:44 AM   Subjective: 60 year old male with a past medical history of laryngeal cancer status post tracheostomy who presented to the ED with concerns of altered mental status per the patient's son. Patient was last seen well around 1 PM on Monday, 10/30/2020.  Patient's son had described the patient leaning to his right side and unable to follow commands or speak.  Patient is normally ambulatory, alert and oriented and able to care for himself.  Initial labs in the ED showed a troponin of 17,000 with severe transaminitis, acute kidney injury.  Chest x-ray showed diffuse hazy opacity in the left thorax.  CT of the head showed bilateral basal ganglia lacunar infarcts likely chronic.  He had a peak troponin of 19,822 trending downward.  Patient appears more aware of this morning.  Still unable to converse due to tracheostomy tube but was able to nod yes or no to specific questions.  Overnight, he had been weaned off of his pressors and is now maintaining an adequate blood pressure.  Patient has been seen to be mildly tachycardic this morning at about 100-110.  Echocardiogram showed normal LV systolic function with an EF of greater than 55%.  Moderately reduced right ventricular systolic function.  Moderate to severe mitral annular calcification, moderate aortic valve thickening, negative bubble study.  Review of Systems: Positive for: Shortness of breath Negative for: Vision change, hearing change, syncope, dizziness, nausea, vomiting,diarrhea, bloody stool, stomach pain, cough, congestion, diaphoresis, urinary frequency, urinary pain,skin lesions, skin rashes Others previously listed  Objective: Telemetry: Sinus tachycardia Physical Exam: Blood pressure (!) 142/94, pulse (!) 102, temperature 98.8 F (37.1 C), temperature source Oral, resp. rate 16, height _0  (1.6  m), weight 76.8 kg, SpO2 96 %. Body mass index is 29.99 kg/m. General: Critically ill, ventilated Head: Normocephalic, atraumatic, sclera non-icteric, no xanthomas, nares are without discharge. Neck: No apparent masses Lungs: Normal respirations with no wheezes, no rhonchi, no rales , no crackles   Heart: Regular rate and rhythm, normal S1 S2, no murmur, no rub, no gallop, PMI is normal size and placement, carotid upstroke normal without bruit, jugular venous pressure normal Abdomen: Soft, non-tender, non-distended with normoactive bowel sounds. No hepatosplenomegaly. Abdominal aorta is normal size without bruit Extremities: No edema, no clubbing, no cyanosis, no ulcers,  Peripheral: 2+ radial, 2+ femoral, 2+ dorsal pedal pulses Neuro: Alert, unable to respond to questions Psych: Unable to respond to questions   Intake/Output Summary (Last 24 hours) at 11/01/2020 1144 Last data filed at 11/01/2020 0809 Gross per 24 hour  Intake 1199.81 ml  Output 550 ml  Net 649.81 ml    Inpatient Medications:  . budesonide (PULMICORT) nebulizer solution  0.5 mg Nebulization BID  . chlorhexidine gluconate (MEDLINE KIT)  15 mL Mouth Rinse BID  . Chlorhexidine Gluconate Cloth  6 each Topical Daily  . free water  30 mL Per Tube Q4H  . ipratropium-albuterol  3 mL Nebulization Q6H  . lactulose  30 g Per Tube BID  . mouth rinse  15 mL Mouth Rinse 10 times per day  . methylPREDNISolone (SOLU-MEDROL) injection  40 mg Intravenous Q24H  . nutrition supplement (JUVEN)  1 packet Per Tube BID BM  . pantoprazole (PROTONIX) IV  40 mg Intravenous QHS   Infusions:  . sodium chloride    . azithromycin 500 mg (10/31/20 2214)  . cefTRIAXone (ROCEPHIN)  IV 1 g (  10/31/20 2116)  . feeding supplement (VITAL AF 1.2 CAL) 40 mL/hr at 11/01/20 0605  . heparin 1,500 Units/hr (11/01/20 1016)  . norepinephrine (LEVOPHED) Adult infusion 2 mcg/min (10/31/20 1900)    Labs: Recent Labs    10/31/20 0421 11/01/20 0434  NA  130* 132*  K 5.0 4.5  CL 98 102  CO2 24 23  GLUCOSE 103* 132*  BUN 38* 58*  CREATININE 3.16* 3.81*  CALCIUM 7.5* 7.8*  MG 2.2 2.3  PHOS 5.5* 4.0   Recent Labs    10/31/20 0421 11/01/20 0434  AST 4,293* 1,490*  ALT 2,410* 1,790*  ALKPHOS 74 77  BILITOT 0.9 0.6  PROT 6.1* 5.8*  ALBUMIN 2.5* 2.3*   Recent Labs    10/31/20 0421 11/01/20 0434  WBC 9.8 7.0  NEUTROABS  --  6.5  HGB 11.3* 11.4*  HCT 33.0* 33.1*  MCV 88.5 86.4  PLT 139* 154   Recent Labs    10/30/20 2211 10/31/20 0421 11/01/20 0434  CKTOTAL 16,180* 12,478* 5,684*   Invalid input(s): POCBNP Recent Labs    10/31/20 0859  HGBA1C 5.5     Weights: Filed Weights   10/30/20 2212 11/01/20 0457  Weight: 72.8 kg 76.8 kg     Radiology/Studies:  CT Abdomen Pelvis Wo Contrast  Result Date: 10/31/2020 CLINICAL DATA:  Abdominal pain EXAM: CT ABDOMEN AND PELVIS WITHOUT CONTRAST TECHNIQUE: Multidetector CT imaging of the abdomen and pelvis was performed following the standard protocol without IV contrast. COMPARISON:  None. FINDINGS: Lower chest: Patchy opacities in the bilateral lower lungs, suspicious for pneumonia. Hepatobiliary: Liver is within normal limits. Layering noncalcified gallstones versus gallbladder sludge (series 2/image 28), without associated inflammatory changes. No intrahepatic or extrahepatic duct dilatation. Pancreas: Within normal limits. Spleen: Within normal limits. Adrenals/Urinary Tract: Adrenal glands are within normal limits. Multiple nonobstructing right lower pole renal calculi measuring up to 7 mm (series 2/image 34). Left kidney is within normal limits. No hydronephrosis. Mildly thick-walled bladder. Stomach/Bowel: Stomach is within normal limits. No evidence of bowel obstruction. Normal appendix (series 2/image 56). Sigmoid diverticulosis, without evidence of diverticulitis. Vascular/Lymphatic: No evidence of abdominal aortic aneurysm. Atherosclerotic calcifications of the abdominal  aorta and branch vessels. No suspicious abdominopelvic lymphadenopathy. Reproductive: Prostate is unremarkable. Other: No abdominopelvic ascites. Tiny fat containing periumbilical hernia (series 2/image 48). Mild fat in the bilateral inguinal canals (series 2/image 76). Musculoskeletal: Degenerative changes of the visualized thoracolumbar spine. IMPRESSION: No evidence of bowel obstruction.  Normal appendix. Sigmoid diverticulosis, without evidence of diverticulitis. Multiple nonobstructing right lower pole renal calculi measuring up to 7 mm. No hydronephrosis. Cholelithiasis, without associated inflammatory changes. Electronically Signed   By: Julian Hy M.D.   On: 10/31/2020 00:18   DG Abd 1 View  Result Date: 10/31/2020 CLINICAL DATA:  Encounter for imaging study to confirm nasogastric (NG) tube placement Z01.89 (ICD-10-CM) EXAM: ABDOMEN - 1 VIEW COMPARISON:  CT 10/30/2020 FINDINGS: Limited radiograph of the lower chest and upper abdomen was obtained for the purposes of enteric tube localization. Enteric tube is seen coursing below the diaphragm with distal tip and side port terminating within the expected location of the gastric body. Visualized bowel gas pattern is nonobstructive. Patchy bibasilar opacities. IMPRESSION: Enteric tube tip and side port terminating within the expected location of the gastric body. Electronically Signed   By: Davina Poke D.O.   On: 10/31/2020 14:18   CT HEAD WO CONTRAST (5MM)  Result Date: 10/30/2020 CLINICAL DATA:  Mental status change, unknown cause EXAM: CT HEAD  WITHOUT CONTRAST TECHNIQUE: Contiguous axial images were obtained from the base of the skull through the vertex without intravenous contrast. COMPARISON:  None. FINDINGS: Brain: Low-density areas in the basal ganglia bilaterally compatible with acute infarcts, age indeterminate, favor chronic. No hemorrhage or hydrocephalus. Vascular: No hyperdense vessel or unexpected calcification. Skull: No acute  calvarial abnormality. Sinuses/Orbits: No acute findings Other: None IMPRESSION: Bilateral basal ganglia lacunar infarcts, age indeterminate but favor chronic. Electronically Signed   By: Rolm Baptise M.D.   On: 10/30/2020 23:58   DG Chest Portable 1 View  Result Date: 10/30/2020 CLINICAL DATA:  Hypoxia EXAM: PORTABLE CHEST 1 VIEW COMPARISON:  None. FINDINGS: Tracheostomy tube tip about 5.8 cm superior to carina. The right lung shows no focal airspace disease. The left lung shows mild diffuse ground-glass opacity. No pleural effusion. Normal cardiac size. No pneumothorax. IMPRESSION: Diffuse hazy opacity in the left thorax which may be secondary to pneumonia Electronically Signed   By: Donavan Foil M.D.   On: 10/30/2020 22:44   EEG adult  Result Date: 10/31/2020 Lora Havens, MD     10/31/2020  4:12 PM Patient Name: HALVOR BEHREND MRN: [id number] Epilepsy Attending: Lora Havens Referring Physician/Provider: Rust-Chester, Huel Cote, NP Date: 10/31/2020 Duration: 23.50 mins Patient history: 60 year old male with altered mental status.  EEG to evaluate for seizure. Level of alertness:  lethargic AEDs during EEG study: None Technical aspects: This EEG study was done with scalp electrodes positioned according to the 10-20 International system of electrode placement. Electrical activity was acquired at a sampling rate of _0  and reviewed with a high frequency filter of _1  and a low frequency filter of _2 . EEG data were recorded continuously and digitally stored. Description: No posterior dominant rhythm was seen. EEG showed continuous generalized 3 to 6 Hz theta-delta slowing. Hyperventilation and photic stimulation were not performed.   ABNORMALITY - Continuous slow, generalized IMPRESSION: This study is suggestive of moderate diffuse encephalopathy, nonspecific etiology. No seizures or epileptiform discharges were seen throughout the recording. Lora Havens   ECHOCARDIOGRAM COMPLETE BUBBLE  STUDY  Result Date: 10/31/2020    ECHOCARDIOGRAM REPORT   Patient Name:   Ronald Murphy Date of Exam: 10/31/2020 Medical Rec #:  [id number]      Height:       63.0 in Accession #:    [id number]     Weight:       160.5 lb Date of Birth:  [birth year]      BSA:          1.761 m Patient Age:    23 years       BP:           106/70 mmHg Patient Gender: M              HR:           89 bpm. Exam Location:  [facility] Procedure: 2D Echo, Cardiac Doppler, Color Doppler and Saline Contrast Bubble            Study Indications:     Stroke 434.91 / I63.9  History:         Patient has no prior history of Echocardiogram examinations.                  Risk Factors:Hypertension.  Sonographer:     Sherrie Sport Referring Phys:  [id number] BRITTON L RUST-CHESTER Diagnosing Phys: Harrell Gave End MD  Sonographer Comments: Echo performed with patient supine and on artificial respirator and no  parasternal window. IMPRESSIONS  1. Left ventricular ejection fraction, by estimation, is >55%. The left ventricle has normal function. Left ventricular endocardial border not optimally defined to evaluate regional wall motion. Left ventricular diastolic parameters are consistent with Grade I diastolic dysfunction (impaired relaxation). Elevated left atrial pressure.  2. Right ventricular systolic function is moderately reduced. The right ventricular size is normal. Moderately increased right ventricular wall thickness. Tricuspid regurgitation signal is inadequate for assessing PA pressure.  3. A small pericardial effusion is present.  4. The mitral valve is abnormal. No evidence of mitral valve regurgitation. No evidence of mitral stenosis. Moderate to severe mitral annular calcification.  5. The aortic valve has an indeterminant number of cusps. There is moderate calcification of the aortic valve. There is moderate thickening of the aortic valve. Aortic valve regurgitation is not visualized. There is at least mild aortic stenosis, though  evaluation is  limited by suboptimal windows.  6. Agitated saline contrast bubble study was negative, with no evidence of any interatrial shunt. FINDINGS  Left Ventricle: Left ventricular ejection fraction, by estimation, is >55%. The left ventricle has normal function. Left ventricular endocardial border not optimally defined to evaluate regional wall motion. The left ventricular internal cavity size was  normal in size. There is no left ventricular hypertrophy. Left ventricular diastolic parameters are consistent with Grade I diastolic dysfunction (impaired relaxation). Elevated left atrial pressure. Right Ventricle: The right ventricular size is normal. Moderately increased right ventricular wall thickness. Right ventricular systolic function is moderately reduced. Tricuspid regurgitation signal is inadequate for assessing PA pressure. Left Atrium: Left atrial size was normal in size. Right Atrium: Right atrial size was normal in size. Pericardium: A small pericardial effusion is present. Mitral Valve: The mitral valve is abnormal. Moderate to severe mitral annular calcification. No evidence of mitral valve regurgitation. No evidence of mitral valve stenosis. MV peak gradient, 3.9 mmHg. The mean mitral valve gradient is 2.0 mmHg. Tricuspid Valve: The tricuspid valve is not well visualized. Tricuspid valve regurgitation is trivial. Aortic Valve: The aortic valve has an indeterminant number of cusps. There is moderate calcification of the aortic valve. There is moderate thickening of the aortic valve. Aortic valve regurgitation is not visualized. There is at least mild aortic stenosis, though evaluation is limited by suboptimal windows. Aortic valve mean gradient measures 10.0 mmHg. Aortic valve peak gradient measures 12.4 mmHg. Aortic valve area, by VTI measures 1.59 cm. Pulmonic Valve: The pulmonic valve was not well visualized. Pulmonic valve regurgitation is not visualized. No evidence of pulmonic stenosis. Aorta: The aortic  root was not well visualized. Pulmonary Artery: The pulmonary artery is of normal size. Venous: IVC assessment for right atrial pressure unable to be performed due to mechanical ventilation. IAS/Shunts: The interatrial septum was not well visualized. Agitated saline contrast was given intravenously to evaluate for intracardiac shunting. Agitated saline contrast bubble study was negative, with no evidence of any interatrial shunt.  LEFT VENTRICLE PLAX 2D LVIDd:         3.40 cm   Diastology LVIDs:         2.00 cm   LV e' medial:    3.59 cm/s LV PW:         0.90 cm   LV E/e' medial:  20.1 LV IVS:        0.90 cm   LV e' lateral:   6.20 cm/s LVOT diam:     2.00 cm   LV E/e' lateral: 11.6 LV SV:  41 LV SV Index:   23 LVOT Area:     3.14 cm  RIGHT VENTRICLE RV S prime:     14.10 cm/s TAPSE (M-mode): 2.8 cm LEFT ATRIUM           Index        RIGHT ATRIUM          Index LA diam:      3.10 cm 1.76 cm/m   RA Area:     6.72 cm LA Vol (A4C): 19.9 ml 11.30 ml/m  RA Volume:   12.20 ml 6.93 ml/m  AORTIC VALVE                     PULMONIC VALVE AV Area (Vmax):    0.99 cm      PV Vmax:        0.83 m/s AV Area (Vmean):   1.11 cm      PV Peak grad:   2.7 mmHg AV Area (VTI):     1.59 cm      RVOT Peak grad: 2 mmHg AV Vmax:           176.00 cm/s AV Vmean:          116.500 cm/s AV VTI:            0.257 m AV Peak Grad:      12.4 mmHg AV Mean Grad:      10.0 mmHg LVOT Vmax:         55.50 cm/s LVOT Vmean:        41.000 cm/s LVOT VTI:          0.130 m LVOT/AV VTI ratio: 0.51 MITRAL VALVE MV Area (PHT): 2.57 cm    SHUNTS MV Area VTI:   1.63 cm    Systemic VTI:  0.13 m MV Peak grad:  3.9 mmHg    Systemic Diam: 2.00 cm MV Mean grad:  2.0 mmHg MV Vmax:       0.99 m/s MV Vmean:      57.2 cm/s MV Decel Time: 295 msec MV E velocity: 72.00 cm/s MV A velocity: 84.00 cm/s MV E/A ratio:  0.86 Christopher End MD Electronically signed by [name] MD Signature Date/Time: 10/31/2020/5:39:22 PM    Final      Assessment and  Recommendation  60 y.o. male critically ill with a past medical history of laryngeal cancer status post tracheostomy tube placement presenting with pneumonia with severe hypoxic respiratory failure, elevated troponin secondary to NSTEMI complicated by multiorgan failure.  Echocardiogram showed preserved ejection fraction and left ventricular systolic function.  Plan: -Continue heparin drip for total of 48 hours for management of suspected NSTEMI, then continue with single antiplatelet therapy after or dual antiplatelet if patient does not have any significant bleeding concerns -Plan to add beta-blocker for further management of NSTEMI with preserved ejection fraction. -Avoid ACE inhibitor's at this time due to acute kidney injury, will consider addition of this as an outpatient -Continue conservative management of NSTEMI at this time due to concurrent multiorgan failure and current illness. -Continue current care and management for severe septic shock with a community-acquired pneumonia, hypoxic respiratory failure, metabolic encephalopathy, multiorgan failure.  Signed, Jettie Booze, PA-C

## 2020-11-01 NOTE — Progress Notes (Signed)
Transported patient to MRI on vent settings 450 r20 35% peep 5. Patient tolerated transport and procedure well. Suctioned airway on vent for small amount of secretions. Trach care performed and placed back on 35% trach collar once returned to room. Patient in no distress. Vent on standby. Will continue to monitor.

## 2020-11-01 NOTE — Progress Notes (Signed)
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  Complex patient with severe Transaminitis, possible sepsis, NSTEMI, acute kidney injury with severe hyperkalemia, and acute altered mental status. 10/19 remains on vent, weaned off pressors      Micro Data:  COVID/INF/HIV NEG BCX NGTD Antimicrobials:   Antibiotics Given (last 72 hours)     Date/Time Action Medication Dose Rate   10/31/20 0137 New Bag/Given   cefTRIAXone (ROCEPHIN) 1 g in sodium chloride 0.9 % 100 mL IVPB 1 g 200 mL/hr   10/31/20 0138 New Bag/Given   azithromycin (ZITHROMAX) 500 mg in sodium chloride 0.9 % 250 mL IVPB 500 mg 250 mL/hr   10/31/20 2116 New Bag/Given   cefTRIAXone (ROCEPHIN) 1 g in sodium chloride 0.9 % 100 mL IVPB 1 g 200 mL/hr   10/31/20 2214 New Bag/Given   azithromycin (ZITHROMAX) 500 mg in sodium chloride 0.9 % 250 mL IVPB 500 mg 250 mL/hr            Interim History / Subjective:  Remains critically ill +renal failure +NSTEMI   CBC    Component Value Date/Time   WBC 7.0 11/01/2020 0434   RBC 3.83 (L) 11/01/2020 0434   HGB 11.4 (L) 11/01/2020 0434   HCT 33.1 (L) 11/01/2020 0434   PLT 154 11/01/2020 0434   MCV 86.4 11/01/2020 0434   MCH 29.8 11/01/2020 0434   MCHC 34.4 11/01/2020 0434   RDW 14.1 11/01/2020 0434   LYMPHSABS 0.2 (L) 11/01/2020 0434   MONOABS 0.2 11/01/2020 0434   EOSABS 0.0 11/01/2020 0434   BASOSABS 0.0 11/01/2020 0434         Objective   Blood pressure (!) 121/92, pulse (!) 108,  temperature 97.8 F (36.6 C), temperature source Oral, resp. rate 17, height 5' 3"  (1.6 m), weight 76.8 kg, SpO2 97 %.    Vent Mode: PRVC FiO2 (%):  [40 %] 40 % Set Rate:  [20 bmp] 20 bmp Vt Set:  [450 mL] 450 mL PEEP:  [5 cmH20] 5 cmH20 Pressure Support:  [8 cmH20] 8 cmH20 Plateau Pressure:  [14 cmH20-19 cmH20] 17 cmH20   Intake/Output Summary (Last 24 hours) at 11/01/2020 0737 Last data filed at 11/01/2020 6546 Gross per 24 hour  Intake 1169.81 ml  Output 775 ml  Net 394.81 ml   Filed Weights   10/30/20 2212 11/01/20 0457  Weight: 72.8 kg 76.8 kg      REVIEW OF SYSTEMS  PATIENT IS UNABLE TO PROVIDE COMPLETE REVIEW OF SYSTEMS DUE TO SEVERE CRITICAL ILLNESS AND TOXIC METABOLIC ENCEPHALOPATHY  ALL OTHER ROS ARE NEGATIVE   PHYSICAL EXAMINATION:  GENERAL:critically ill appearing, +resp distress EYES: Pupils equal, round, reactive to light.  No scleral icterus.  MOUTH: Moist mucosal membrane. TRACH ON VENT NECK: Supple.  PULMONARY: +rhonchi, +wheezing CARDIOVASCULAR: S1 and S2.  No murmurs  GASTROINTESTINAL: Soft, nontender, -distended. Positive bowel sounds.  MUSCULOSKELETAL: No swelling, clubbing, or edema.  NEUROLOGIC: obtunded SKIN:intact,warm,dry    Labs/imaging that I havepersonally reviewed  (right click and "Reselect all SmartList Selections" daily)      ASSESSMENT AND PLAN SYNOPSIS  Multiorgan failure Pneumonia with severe hypoxic resp failure with septic shock and acute ischemic cardiomyopathy with NSTEMI with acute renal failure and liver failure and acidosis  Severe ACUTE Hypoxic and Hypercapnic Respiratory Failure -continue Mechanical Ventilator support -continue Bronchodilator Therapy -Wean Fio2 and PEEP as tolerated -VAP/VENT bundle implementation -will perform SAT/SBT when respiratory parameters are met  Vent Mode: PRVC FiO2 (%):  [40 %] 40 % Set Rate:  [20 bmp] 20 bmp Vt Set:  [450 mL] 450 mL PEEP:  [5 cmH20] 5 cmH20 Pressure  Support:  [8 cmH20] 8 cmH20 Plateau Pressure:  [14 cmH20-19 cmH20] 17 cmH20   CARDIAC FAILURE-acute NSTEMI  -oxygen as needed -Lasix as tolerated -follow up cardiac enzymes as indicated Follow up cardiology recs On heparin infusion   CARDIAC ICU monitoring   ACUTE KIDNEY INJURY/Renal Failure -continue Foley Catheter-assess need -Avoid nephrotoxic agents -Follow urine output, BMP -Ensure adequate renal perfusion, optimize oxygenation -Renal dose medications   Intake/Output Summary (Last 24 hours) at 11/01/2020 0737 Last data filed at 11/01/2020 0347 Gross per 24 hour  Intake 1169.81 ml  Output 775 ml  Net 394.81 ml     NEUROLOGY Acute toxic metabolic encephalopathy, Avoid sedatives for now   SEPTIC SHOCK SOURCE-PNEUMONIA -use vasopressors to keep MAP>65 as needed -follow ABG and LA -follow up cultures -emperic ABX   ENDO - ICU hypoglycemic\Hyperglycemia protocol -check FSBS per protocol   GI GI PROPHYLAXIS as indicated  NUTRITIONAL STATUS DIET-->TF's as tolerated Constipation protocol as indicated   ELECTROLYTES -follow labs as needed -replace as needed -pharmacy consultation and following     Best practice (right click and "Reselect all SmartList Selections" daily)  Diet:  NPO Pain/Anxiety/Delirium protocol (if indicated): No VAP protocol (if indicated): Yes DVT prophylaxis: Systemic AC GI prophylaxis: H2B Glucose control:  SSI Yes Central venous access:  N/A Arterial line:  N/A Foley:  Yes, and it is still needed Mobility:  bed rest  Code Status:  FULL CODE Disposition: ICU  Labs   CBC: Recent Labs  Lab 10/30/20 2211 10/31/20 0421 11/01/20 0434  WBC 9.9 9.8 7.0  NEUTROABS  --   --  6.5  HGB 13.7 11.3* 11.4*  HCT 39.6 33.0* 33.1*  MCV 88.8 88.5 86.4  PLT 165 139* 425    Basic Metabolic Panel: Recent Labs  Lab 10/30/20 2211 10/31/20 0151 10/31/20 0421 11/01/20 0434  NA 129*  --  130* 132*  K 6.3* 5.1 5.0 4.5  CL  90*  --  98 102  CO2 26  --  24 23  GLUCOSE 106*  --  103* 132*  BUN 34*  --  38* 58*  CREATININE 3.64*  --  3.16* 3.81*  CALCIUM 8.5*  --  7.5* 7.8*  MG  --   --  2.2 2.3  PHOS  --   --  5.5* 4.0   GFR: Estimated Creatinine Clearance: 18.7 mL/min (A) (by C-G formula based on SCr of 3.81 mg/dL (H)). Recent Labs  Lab 10/30/20 2211 10/31/20 0152 10/31/20 0421 11/01/20 0434  PROCALCITON 1.73  --   --   --   WBC 9.9  --  9.8 7.0  LATICACIDVEN 2.8* 1.7  --   --     Liver Function Tests: Recent Labs  Lab 10/30/20 2211 10/31/20 0421 11/01/20 0434  AST 7,291* 4,293* 1,490*  ALT 2,441* 2,410* 1,790*  ALKPHOS 91 74 77  BILITOT 1.4* 0.9 0.6  PROT 8.0 6.1* 5.8*  ALBUMIN 2.9* 2.5* 2.3*   No results for  input(s): LIPASE, AMYLASE in the last 168 hours. Recent Labs  Lab 10/31/20 0006 11/01/20 0434  AMMONIA 51* 34    ABG    Component Value Date/Time   PHART 7.31 (L) 10/31/2020 0108   PCO2ART 51 (H) 10/31/2020 0108   PO2ART 169 (H) 10/31/2020 0108   HCO3 22.7 10/31/2020 0910   ACIDBASEDEF 5.1 (H) 10/31/2020 0910   O2SAT 82.0 10/31/2020 0910     Coagulation Profile: Recent Labs  Lab 10/30/20 2211  INR 1.9*    Cardiac Enzymes: Recent Labs  Lab 10/30/20 2211 10/31/20 0421 11/01/20 0434  CKTOTAL 16,180* 12,478* 5,684*    HbA1C: Hgb A1c MFr Bld  Date/Time Value Ref Range Status  10/31/2020 08:59 AM 5.5 4.8 - 5.6 % Final    Comment:    (NOTE)         Prediabetes: 5.7 - 6.4         Diabetes: >6.4         Glycemic control for adults with diabetes: <7.0     CBG: Recent Labs  Lab 10/31/20 1510 10/31/20 2004 10/31/20 2353 11/01/20 0420 11/01/20 0714  GLUCAP 129* 135* 182* 127* 133*    Allergies No Known Allergies     DVT/GI PRX  assessed I Assessed the need for Labs I Assessed the need for Foley I Assessed the need for Central Venous Line Family Discussion when available I Assessed the need for Mobilization I made an Assessment of medications  to be adjusted accordingly Safety Risk assessment completed  CASE DISCUSSED IN MULTIDISCIPLINARY ROUNDS WITH ICU TEAM     Critical Care Time devoted to patient care services described in this note is 50 minutes.  Critical care was necessary to treat or prevent imminent or life-threatening deterioration.   Patient with Multiorgan failure and at high risk for cardiac arrest and death.    Corrin Parker, M.D.  [facility]  Medical Director [place] Director [facility] Cardio-Pulmonary Department

## 2020-11-01 NOTE — Progress Notes (Signed)
ANTICOAGULATION CONSULT NOTE  Pharmacy Consult for heparin infusion Indication: ACS/STEMI  No Known Allergies  Patient Measurements: Height: 5\' 3"  (160 cm) Weight: 76.8 kg (169 lb 5 oz) IBW/kg (Calculated) : 56.9 Heparin Dosing Weight: 71.6 kg  Vital Signs: Temp: 97.8 F (36.6 C) (10/19 0400) Temp Source: Oral (10/19 0400) BP: 93/66 (10/19 0500) Pulse Rate: 79 (10/19 0500)  Labs: Recent Labs    10/30/20 2211 10/31/20 0152 10/31/20 0421 10/31/20 0539 10/31/20 0859 10/31/20 1200 10/31/20 2004 11/01/20 0434  HGB 13.7  --  11.3*  --   --   --   --  11.4*  HCT 39.6  --  33.0*  --   --   --   --  33.1*  PLT 165  --  139*  --   --   --   --  154  APTT 40*  --   --   --   --   --   --   --   LABPROT 21.5*  --   --   --   --   --   --   --   INR 1.9*  --   --   --   --   --   --   --   HEPARINUNFRC  --   --   --   --  <0.10*  --  0.10* 0.15*  CREATININE 3.64*  --  3.16*  --   --   --   --   --   CKTOTAL 16,180*  --  12,478*  --   --   --   --   --   TROPONINIHS 17,932*   < > 16,665* 17,317* 58,309* 11,936*  --   --    < > = values in this interval not displayed.     Estimated Creatinine Clearance: 22.5 mL/min (A) (by C-G formula based on SCr of 3.16 mg/dL (H)).   Medical History: Past Medical History:  Diagnosis Date   Alcoholism in remission ([place])    Anxiety    Depression    Hepatitis C    Hypertension    Kidney stone    Larynx cancer ([place]) 04/05/2017   Stage 3    Assessment: 60 y/o male with h/o SCC lung, laryngeal cancer s/p chemotherapy/XRT, radiation induced esophageal stricture s/p dilation, chronic trach, recurrent aspiration PNA, Hep C, anxiety, depression, etoh abuse (remission) and HTN who is admitted with lacunar infarct, CAP, septic shock and NSTEMI. Pharmacy consulted for heparin monitoring. He is being followed by cardiology with plans to continue heparin drip for 48 hours. H&H, platelets noted to be trending down slightly.   Goal of Therapy:   Heparin level 0.3-0.7 units/ml Monitor platelets by anticoagulation protocol: Yes   Plan:  Heparin Level remains subtherapeutic in spite of previous rate increase heparin 2100 unit IV bolus then Increase heparin infusion rate to 1500 units/hr Recheck heparin level 8 hours after rate change CBC daily while on heparin  Renda Rolls, PharmD, [facility] 11/01/2020 5:24 AM

## 2020-11-01 NOTE — Progress Notes (Signed)
Neurology Progress Note  Patient ID: Ronald Murphy is a 60 y.o. 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 (s/p completion of radiation treatments in June 2019 with chemotherapy begun but truncated secondary to tolerability, known C1 metastasis s/p [employer] radiation treatment and chemotherapy, most recently on durvalumab which was started 02/04/2019 with last infusion on 02/23/2020), hypertension, alcoholism in remission, anxiety/depression, kidney stones  Initially consulted for: AMS and bilateral basal ganglia lesions  Major interval events:  - Was on trach collar for MRI and desaturated   Subjective: - Much more interactive, denies acute pain   Exam: Vitals:   11/01/20 0800 11/01/20 0809  BP: (!) 142/94   Pulse: (!) 107 (!) 109  Resp: 13 14  Temp:  98.8 F (37.1 C)  SpO2: 95% 96%   Gen: In bed, comfortable  Resp: non-labored breathing, no grossly audible wheezing Cardiac: Perfusing extremities well  Abd: soft, nt  Neuro: MS: Much more alert today, readily following commands, mouths that his friend is visiting him when I ask who it is and she confirms that this answer is correct.  Not oriented to year by choices.  Still has somewhat poor attention CN: No gaze preference today, EOMI, PERRL, face symmetric, tongue midline Motor: Able to maintain bilateral upper extremities antigravity though holds them up only for a few seconds before he allows him to fall.  Similarly both legs are at least antigravity but then he holds them up only for a count of 3 Sensory: Equally reactive to touch in all 4 extremities  Pertinent Data:  Basic Metabolic Panel: Recent Labs  Lab 10/30/20 2211 10/31/20 0151 10/31/20 0421 11/01/20 0434  NA 129*  --  130* 132*  K 6.3* 5.1 5.0 4.5  CL 90*  --  98 102  CO2 26  --  24 23  GLUCOSE 106*  --  103* 132*  BUN 34*  --  38* 58*  CREATININE 3.64*  --  3.16* 3.81*  CALCIUM 8.5*  --  7.5*  7.8*  MG  --   --  2.2 2.3  PHOS  --   --  5.5* 4.0    CBC: Recent Labs  Lab 10/30/20 2211 10/31/20 0421 11/01/20 0434  WBC 9.9 9.8 7.0  NEUTROABS  --   --  6.5  HGB 13.7 11.3* 11.4*  HCT 39.6 33.0* 33.1*  MCV 88.8 88.5 86.4  PLT 165 139* 154      Lab Results  Component Value Date   CKTOTAL 5,684 (H) 11/01/2020  Troponin also improving  ECHO 10/18  1. Left ventricular ejection fraction, by estimation, is >55%. The left  ventricle has normal function. Left ventricular endocardial border not  optimally defined to evaluate regional wall motion. Left ventricular  diastolic parameters are consistent with  Grade I diastolic dysfunction (impaired relaxation). Elevated left atrial  pressure.   2. Right ventricular systolic function is moderately reduced. The right  ventricular size is normal. Moderately increased right ventricular wall  thickness. Tricuspid regurgitation signal is inadequate for assessing PA  pressure.   3. A small pericardial effusion is present.   4. The mitral valve is abnormal. No evidence of mitral valve  regurgitation. No evidence of mitral stenosis. Moderate to severe mitral  annular calcification.   5. The aortic valve has an indeterminant number of cusps. There is  moderate calcification of the aortic valve. There is moderate thickening  of the aortic valve. Aortic valve regurgitation  is not visualized. There  is at least mild aortic stenosis, though   evaluation is limited by suboptimal windows.   6. Agitated saline contrast bubble study was negative, with no evidence  of any interatrial shunt.   Lab Results  Component Value Date   TSH 3.390 10/31/2020   T4TOTAL 4.6 10/31/2020   No results found for: VITAMINB12    10/18 routine EEG:  ABNORMALITY - Continuous slow, generalized IMPRESSION: This study is suggestive of moderate diffuse encephalopathy, nonspecific etiology. No seizures or epileptiform discharges were seen throughout the  recording.  Impression: Mental status improving as the toxic metabolic derangements are expertly managed by our critical care team.  [place] cardiology following as well.  Recommendations: - B12, MMA, thiamine levels given he was reportedly sleepy for a few weeks at home and potentially not eating well - Appreciate CCM management of ventilator for MRI and management of other comorbidities - Neurology will continue to follow for MRI results to clarify basal ganglia lesions  Lesleigh Noe MD-PhD [facility] [phone]   CRITICAL CARE Performed by: Lorenza Chick   Total critical care time: 35 minutes  Critical care time was exclusive of separately billable procedures and treating other patients.  Critical care was necessary to treat or prevent imminent or life-threatening deterioration.  Critical care was time spent personally by me on the following activities: development of treatment plan with patient and/or surrogate as well as nursing, discussions with consultants, evaluation of patient's response to treatment, examination of patient, obtaining history from patient or surrogate, ordering and performing treatments and interventions, ordering and review of laboratory studies, ordering and review of radiographic studies, pulse oximetry and re-evaluation of patient's condition.

## 2020-11-01 NOTE — Progress Notes (Signed)
ANTICOAGULATION CONSULT NOTE  Pharmacy Consult for heparin infusion Indication: ACS/STEMI  No Known Allergies  Patient Measurements: Height: 5\' 3"  (160 cm) Weight: 76.8 kg (169 lb 5 oz) IBW/kg (Calculated) : 56.9 Heparin Dosing Weight: 71.6 kg  Vital Signs: Temp: 98.7 F (37.1 C) (10/19 1215) Temp Source: Axillary (10/19 1215) BP: 149/86 (10/19 1300) Pulse Rate: 105 (10/19 1300)  Labs: Recent Labs    10/30/20 2211 10/31/20 0152 10/31/20 0421 10/31/20 0539 10/31/20 0859 10/31/20 0859 10/31/20 1200 10/31/20 2004 11/01/20 0434 11/01/20 1318  HGB 13.7  --  11.3*  --   --   --   --   --  11.4*  --   HCT 39.6  --  33.0*  --   --   --   --   --  33.1*  --   PLT 165  --  139*  --   --   --   --   --  154  --   APTT 40*  --   --   --   --   --   --   --   --   --   LABPROT 21.5*  --   --   --   --   --   --   --   --   --   INR 1.9*  --   --   --   --   --   --   --   --   --   HEPARINUNFRC  --   --   --   --  <0.10*   < >  --  0.10* 0.15* 0.33  CREATININE 3.64*  --  3.16*  --   --   --   --   --  3.81*  --   CKTOTAL 16,180*  --  12,478*  --   --   --   --   --  5,684*  --   TROPONINIHS 17,932*   < > 16,665* 17,317* 03,888*  --  11,936*  --   --   --    < > = values in this interval not displayed.     Estimated Creatinine Clearance: 18.7 mL/min (A) (by C-G formula based on SCr of 3.81 mg/dL (H)).   Medical History: Past Medical History:  Diagnosis Date   Alcoholism in remission ([employer])    Anxiety    Depression    Hepatitis C    Hypertension    Kidney stone    Larynx cancer ([place]) 04/05/2017   Stage 3    Assessment: 60 y/o male with h/o SCC lung, laryngeal cancer s/p chemotherapy/XRT, radiation induced esophageal stricture s/p dilation, chronic trach, recurrent aspiration PNA, Hep C, anxiety, depression, etoh abuse (remission) and HTN who is admitted with lacunar infarct, CAP, septic shock and NSTEMI. Pharmacy consulted for heparin monitoring. He is being followed  by cardiology with plans to continue heparin drip for 48 hours. H&H, platelets noted to be trending down slightly.   Goal of Therapy:  Heparin level 0.3-0.7 units/ml Monitor platelets by anticoagulation protocol: Yes   Plan:  Heparin level therapeutic Continue heparin at 1500 units/hr Check HL at 2100 CBC daily while on heparin  Tawnya Crook, PharmD, BCPS Clinical Pharmacist 11/01/2020 2:08 PM

## 2020-11-01 NOTE — Progress Notes (Signed)
PHARMACY CONSULT NOTE - FOLLOW UP  Pharmacy Consult for Electrolyte Monitoring and Replacement   Recent Labs: Potassium (mmol/L)  Date Value  11/01/2020 4.5   Magnesium (mg/dL)  Date Value  11/01/2020 2.3   Calcium (mg/dL)  Date Value  11/01/2020 7.8 (L)   Albumin (g/dL)  Date Value  11/01/2020 2.3 (L)   Phosphorus (mg/dL)  Date Value  11/01/2020 4.0   Sodium (mmol/L)  Date Value  11/01/2020 132 (L)     Assessment: 60 year old male presented with AMS. Patient found to have elevated creatinine and LFTs. Concern for NSTEMI with increased troponin. He has a history of laryngeal cancer with tracheostomy in place. Patient admitted to the ICU. Pharmacy consult to manage electrolytes.  Goal of Therapy:  Electrolytes WNL  Plan:  --Creatinine with bump overnight --No replacement indicated today --Follow up electrolytes with morning labs  Tawnya Crook, PharmD, BCPS Clinical Pharmacist 11/01/2020 11:38 AM

## 2020-11-01 NOTE — Progress Notes (Addendum)
Notified by Radiologist of abnormal finding noted on MRI/MRA Brain showing bilateral hypodense lesions in the basal ganglia specifically in the Globus Pallidus concerning for carbon monoxide poisoning, with other etiologies including mitochondrial or toxic encephalopathies.  Patient presented with acute encephalopathy and was noted with burns and blisters to his left ear, left shoulder and right knee.   Plan -COhemoglobin levels not available on admission, will check levels though may be marginal given time since exposure has elapsed and with 02 treatment. -Continue Supplemental O2 100% via trach collar  -Follow intermittent ABG and chest x-ray as needed -Ensure adequate pulmonary hygiene        Rufina Falco, DNP, CCRN, FNP-C, AGACNP-BC Acute Care Nurse Practitioner  [place] [facility] Pager: [phone] [place] at [facility]

## 2020-11-02 ENCOUNTER — Inpatient Hospital Stay: Payer: Medicare HMO

## 2020-11-02 DIAGNOSIS — G9341 Metabolic encephalopathy: Secondary | ICD-10-CM | POA: Diagnosis not present

## 2020-11-02 DIAGNOSIS — J9601 Acute respiratory failure with hypoxia: Secondary | ICD-10-CM | POA: Diagnosis not present

## 2020-11-02 DIAGNOSIS — R778 Other specified abnormalities of plasma proteins: Secondary | ICD-10-CM | POA: Diagnosis not present

## 2020-11-02 DIAGNOSIS — I214 Non-ST elevation (NSTEMI) myocardial infarction: Secondary | ICD-10-CM | POA: Diagnosis not present

## 2020-11-02 DIAGNOSIS — R4182 Altered mental status, unspecified: Secondary | ICD-10-CM | POA: Diagnosis not present

## 2020-11-02 LAB — CBC WITH DIFFERENTIAL/PLATELET
Abs Immature Granulocytes: 0.22 10*3/uL — ABNORMAL HIGH (ref 0.00–0.07)
Basophils Absolute: 0 10*3/uL (ref 0.0–0.1)
Basophils Relative: 0 %
Eosinophils Absolute: 0 10*3/uL (ref 0.0–0.5)
Eosinophils Relative: 0 %
HCT: 39 % (ref 39.0–52.0)
Hemoglobin: 13.2 g/dL (ref 13.0–17.0)
Immature Granulocytes: 2 %
Lymphocytes Relative: 4 %
Lymphs Abs: 0.5 10*3/uL — ABNORMAL LOW (ref 0.7–4.0)
MCH: 29.5 pg (ref 26.0–34.0)
MCHC: 33.8 g/dL (ref 30.0–36.0)
MCV: 87.1 fL (ref 80.0–100.0)
Monocytes Absolute: 0.3 10*3/uL (ref 0.1–1.0)
Monocytes Relative: 2 %
Neutro Abs: 11.7 10*3/uL — ABNORMAL HIGH (ref 1.7–7.7)
Neutrophils Relative %: 92 %
Platelets: 208 10*3/uL (ref 150–400)
RBC: 4.48 MIL/uL (ref 4.22–5.81)
RDW: 14.5 % (ref 11.5–15.5)
WBC: 12.8 10*3/uL — ABNORMAL HIGH (ref 4.0–10.5)
nRBC: 0 % (ref 0.0–0.2)

## 2020-11-02 LAB — COMPREHENSIVE METABOLIC PANEL
ALT: 1460 U/L — ABNORMAL HIGH (ref 0–44)
AST: 711 U/L — ABNORMAL HIGH (ref 15–41)
Albumin: 2.6 g/dL — ABNORMAL LOW (ref 3.5–5.0)
Alkaline Phosphatase: 100 U/L (ref 38–126)
Anion gap: 12 (ref 5–15)
BUN: 86 mg/dL — ABNORMAL HIGH (ref 8–23)
CO2: 23 mmol/L (ref 22–32)
Calcium: 8.3 mg/dL — ABNORMAL LOW (ref 8.9–10.3)
Chloride: 101 mmol/L (ref 98–111)
Creatinine, Ser: 3.96 mg/dL — ABNORMAL HIGH (ref 0.61–1.24)
GFR, Estimated: 16 mL/min — ABNORMAL LOW (ref >60)
Glucose, Bld: 131 mg/dL — ABNORMAL HIGH (ref 70–99)
Potassium: 4.4 mmol/L (ref 3.5–5.1)
Sodium: 136 mmol/L (ref 135–145)
Total Bilirubin: 0.6 mg/dL (ref 0.3–1.2)
Total Protein: 6.5 g/dL (ref 6.5–8.1)

## 2020-11-02 LAB — VITAMIN B12: Vitamin B-12: 1486 pg/mL — ABNORMAL HIGH (ref 180–914)

## 2020-11-02 LAB — COOXEMETRY PANEL
Carboxyhemoglobin: 1.7 % — ABNORMAL HIGH (ref 0.5–1.5)
Methemoglobin: 0.7 % (ref 0.0–1.5)
O2 Saturation: 96.9 %

## 2020-11-02 LAB — PHOSPHORUS: Phosphorus: 3.8 mg/dL (ref 2.5–4.6)

## 2020-11-02 LAB — GLUCOSE, CAPILLARY
Glucose-Capillary: 110 mg/dL — ABNORMAL HIGH (ref 70–99)
Glucose-Capillary: 111 mg/dL — ABNORMAL HIGH (ref 70–99)
Glucose-Capillary: 134 mg/dL — ABNORMAL HIGH (ref 70–99)
Glucose-Capillary: 136 mg/dL — ABNORMAL HIGH (ref 70–99)
Glucose-Capillary: 141 mg/dL — ABNORMAL HIGH (ref 70–99)
Glucose-Capillary: 185 mg/dL — ABNORMAL HIGH (ref 70–99)

## 2020-11-02 LAB — CK: Total CK: 2866 U/L — ABNORMAL HIGH (ref 49–397)

## 2020-11-02 LAB — MAGNESIUM: Magnesium: 2.5 mg/dL — ABNORMAL HIGH (ref 1.7–2.4)

## 2020-11-02 LAB — HEPARIN LEVEL (UNFRACTIONATED): Heparin Unfractionated: 0.59 IU/mL (ref 0.30–0.70)

## 2020-11-02 IMAGING — DX DG CHEST 1V PORT
1 series · 1 of 1 positions shown · non-contrast
Comparison: Portable chest [DATE].

CLINICAL DATA: 60-year-old male with acute respiratory failure.

EXAM:
PORTABLE CHEST 1 VIEW

[chest ap]
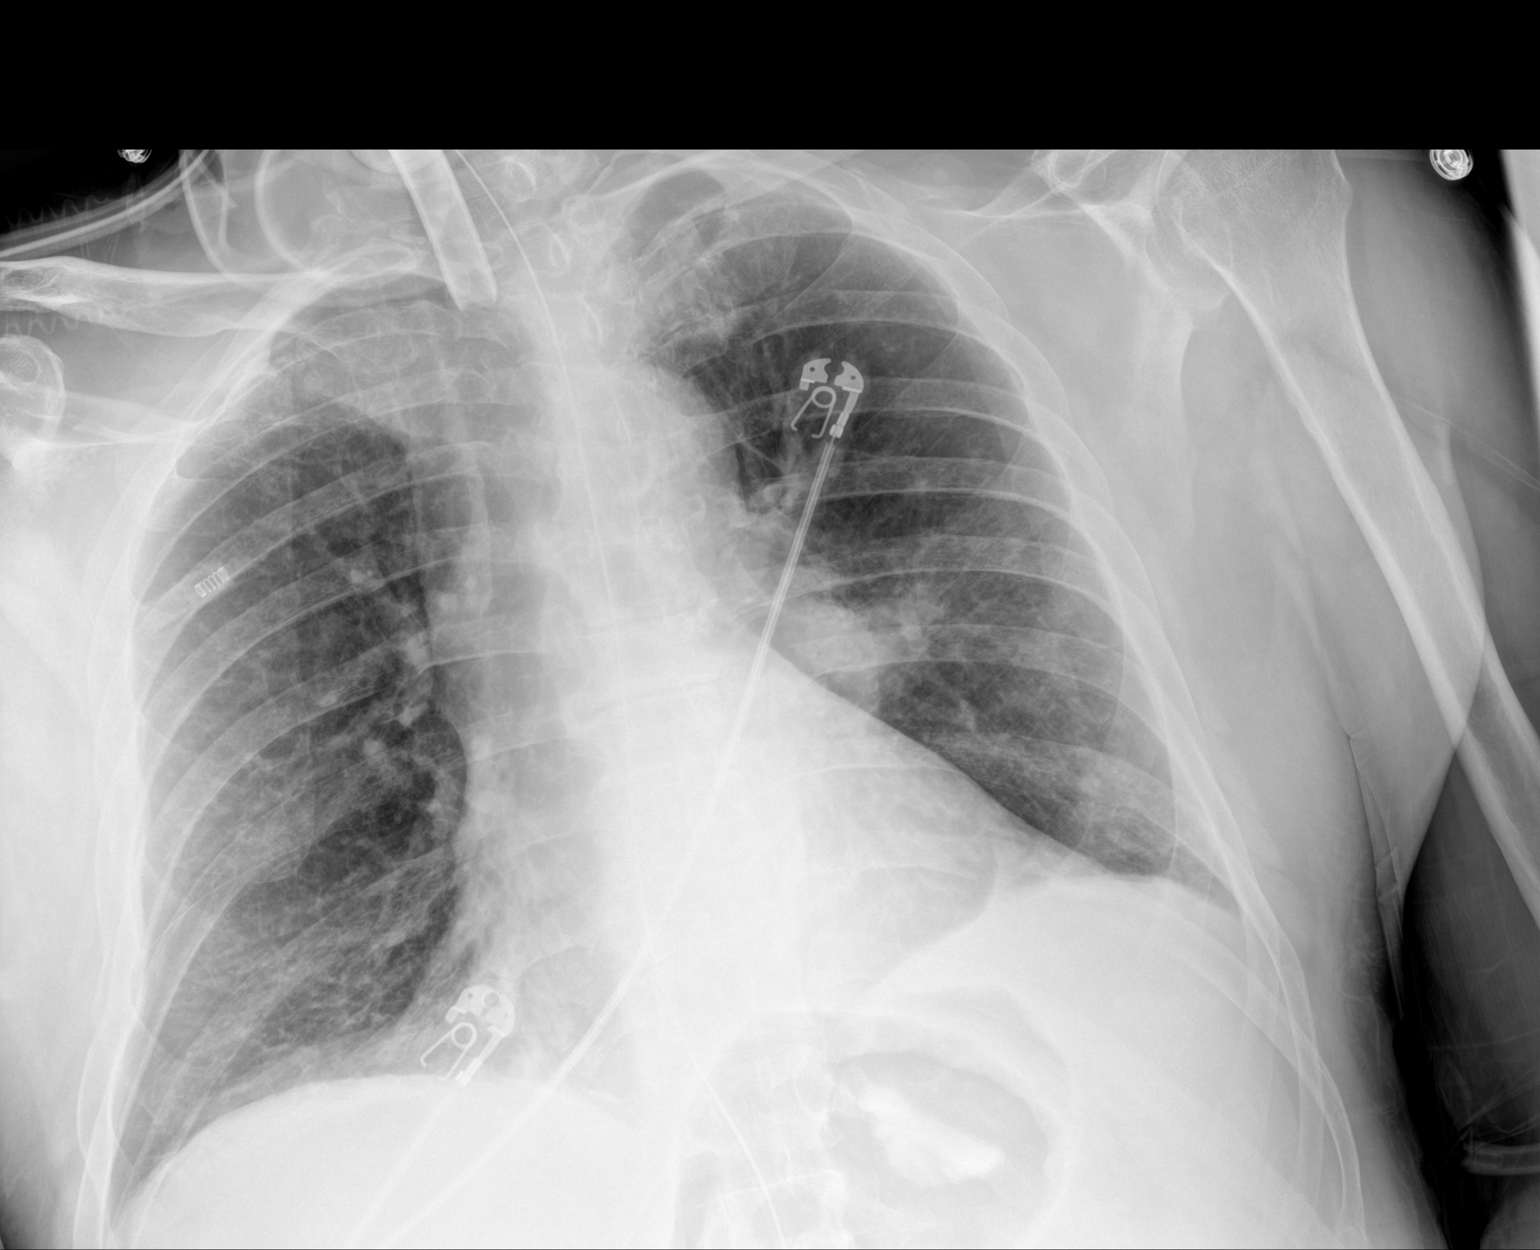

[1 of 1 positions shown; findings below may reference images not displayed]

FINDINGS: Portable AP view at [6G] hours. Stable tracheostomy. Enteric tube
now in place, courses to the stomach with tip not included. Mildly
improved lung volumes. Regressed patchy and peripheral left lung
opacity, only mild residual. Elsewhere lung markings are stable. No
pneumothorax, pleural effusion or new pulmonary opacity. No acute
osseous abnormality identified. Visible bowel-gas pattern within
normal limits.
IMPRESSION: 1. Stable tracheostomy. Enteric tube courses to the stomach with tip
not included.
2. Substantially regressed left lung opacity with mild residual.
3. No new cardiopulmonary abnormality.

## 2020-11-02 MED ORDER — IPRATROPIUM-ALBUTEROL 0.5-2.5 (3) MG/3ML IN SOLN
3.0000 mL | Freq: Two times a day (BID) | RESPIRATORY_TRACT | Status: DC
  Administered 2020-11-02 – 2020-11-06 (×8): 3 mL via RESPIRATORY_TRACT
  Filled 2020-11-02 (×8): qty 3

## 2020-11-02 MED ORDER — SODIUM CHLORIDE 0.9 % IV BOLUS
1000.0000 mL | Freq: Once | INTRAVENOUS | Status: AC
  Administered 2020-11-02: 1000 mL via INTRAVENOUS

## 2020-11-02 MED ORDER — OXYCODONE HCL 5 MG PO TABS
15.0000 mg | ORAL_TABLET | ORAL | Status: DC | PRN
  Administered 2020-11-13 – 2020-11-15 (×5): 15 mg
  Filled 2020-11-02 (×5): qty 3

## 2020-11-02 MED ORDER — SODIUM CHLORIDE 0.9 % IV BOLUS
500.0000 mL | Freq: Once | INTRAVENOUS | Status: AC
  Administered 2020-11-02: 500 mL via INTRAVENOUS

## 2020-11-02 MED ORDER — SODIUM CHLORIDE 0.9 % IV SOLN
250.0000 mL | INTRAVENOUS | Status: DC
  Administered 2020-11-04: 250 mL via INTRAVENOUS

## 2020-11-02 MED ORDER — POLYETHYLENE GLYCOL 3350 17 G PO PACK: 17.0000 g | PACK | Freq: Every day | ORAL | Status: DC | PRN

## 2020-11-02 MED ORDER — HEPARIN BOLUS VIA INFUSION
1100.0000 [IU] | Freq: Once | INTRAVENOUS | Status: AC
  Administered 2020-11-02: 1100 [IU] via INTRAVENOUS
  Filled 2020-11-02: qty 1100

## 2020-11-02 MED ORDER — NOREPINEPHRINE 4 MG/250ML-% IV SOLN
2.0000 ug/min | INTRAVENOUS | Status: DC
  Filled 2020-11-02: qty 250

## 2020-11-02 MED ORDER — ESCITALOPRAM OXALATE 20 MG PO TABS
20.0000 mg | ORAL_TABLET | Freq: Every day | ORAL | Status: DC
  Administered 2020-11-02 – 2020-11-15 (×14): 20 mg
  Filled 2020-11-02 (×14): qty 1

## 2020-11-02 MED ORDER — GABAPENTIN 250 MG/5ML PO SOLN: 300.0000 mg | Freq: Every day | ORAL | Status: DC

## 2020-11-02 MED ORDER — CLONAZEPAM 0.5 MG PO TABS: 0.5000 mg | ORAL_TABLET | Freq: Two times a day (BID) | ORAL | Status: DC | PRN

## 2020-11-02 MED ORDER — HEPARIN SODIUM (PORCINE) 5000 UNIT/ML IJ SOLN
5000.0000 [IU] | Freq: Three times a day (TID) | INTRAMUSCULAR | Status: DC
  Administered 2020-11-02 – 2020-11-12 (×30): 5000 [IU] via SUBCUTANEOUS
  Filled 2020-11-02 (×30): qty 1

## 2020-11-02 MED ORDER — GUAIFENESIN 100 MG/5ML PO LIQD
5.0000 mL | ORAL | Status: DC | PRN
  Filled 2020-11-02 (×2): qty 5

## 2020-11-02 NOTE — Progress Notes (Signed)
Placed patient back on vent as his level of consciousness was different than earlier and respirations were shallow. Tolerated interventions well. Provider notified.

## 2020-11-02 NOTE — Progress Notes (Signed)
NAME:  Ronald Murphy, MRN:  [id number], DOB:  12-Jun-1960, LOS: 2 ADMISSION DATE:  10/30/2020 BRIEF SYNOPSIS Multiorgan failure Pneumonia with severe hypoxic resp failure with septic shock and acute ischemic cardiomyopathy with NSTEMI with acute renal failure and liver failure and acidosis  MRI BRAIN 10/20  Focal, symmetric increased signal on diffusion-weighted imaging in the bilateral globi palladi, without ADC correlate, and associated with increased T2 signal. While these signal characteristics can be seen in the setting of subacute ischemia, the symmetry of these finding would be unusual for ischemia but can be seen in the setting of carbon monoxide poisoning, with other etiologies including mitochondrial or toxic encephalopathies. 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  Complex patient with severe Transaminitis, possible sepsis, NSTEMI, acute kidney injury with severe hyperkalemia, and acute altered mental status. 10/19 remains on vent, weaned off pressors 10/20 remains on vent, low BP, ms changes, follow up [place]:  COVID/INF/HIV NEG BCX NGTD Antimicrobials:   Antibiotics Given (last 72 hours)     Date/Time Action Medication Dose Rate   10/31/20 0137 New Bag/Given   cefTRIAXone (ROCEPHIN) 1 g in sodium chloride 0.9 % 100 mL IVPB 1 g 200 mL/hr   10/31/20 0138 New Bag/Given   azithromycin (ZITHROMAX) 500 mg in sodium chloride 0.9 % 250 mL IVPB 500 mg 250 mL/hr   10/31/20 2116 New Bag/Given   cefTRIAXone (ROCEPHIN) 1 g in sodium chloride 0.9 % 100 mL IVPB 1 g 200 mL/hr   10/31/20 2214 New Bag/Given   azithromycin (ZITHROMAX) 500 mg in sodium chloride 0.9 % 250 mL IVPB 500 mg 250 mL/hr   11/01/20 2306 New Bag/Given   cefTRIAXone (ROCEPHIN) 1 g in sodium chloride 0.9 %  100 mL IVPB 1 g 200 mL/hr   11/01/20 2355 New Bag/Given   azithromycin (ZITHROMAX) 500 mg in sodium chloride 0.9 % 250 mL IVPB 500 mg 250 mL/hr            Interim History / Subjective:  Remains critically ill Low BP High risk for cardiac arrest   CBC    Component Value Date/Time   WBC 7.0 11/01/2020 0434   RBC 3.83 (L) 11/01/2020 0434   HGB 11.4 (L) 11/01/2020 0434   HCT 33.1 (L) 11/01/2020 0434   PLT 154 11/01/2020 0434   MCV 86.4 11/01/2020 0434   MCH 29.8 11/01/2020 0434   MCHC 34.4 11/01/2020 0434   RDW 14.1 11/01/2020 0434   LYMPHSABS 0.2 (L) 11/01/2020 0434   MONOABS 0.2 11/01/2020 0434   EOSABS 0.0 11/01/2020 0434   BASOSABS 0.0 11/01/2020 0434         Objective   Blood pressure (!) 70/57, pulse 62, temperature 98.8 F (37.1 C), temperature source Axillary, resp. rate 19, height 5' 3"  (1.6 m), weight 76.8 kg, SpO2 97 %.    Vent Mode: PRVC FiO2 (%):  [35 %-40 %] 40 % Set Rate:  [20 bmp] 20 bmp Vt Set:  [450 mL] 450 mL PEEP:  [5 cmH20] 5 cmH20 Pressure Support:  [5 cmH20-8 cmH20] 5 cmH20 Plateau Pressure:  [10 cmH20] 10 cmH20   Intake/Output Summary (Last 24 hours) at 11/02/2020 0718 Last data filed at 11/02/2020 0420 Gross per 24 hour  Intake 1323.45 ml  Output 1140 ml  Net 183.45 ml    [employer]  10/30/20 2212 11/01/20 0457  Weight: 72.8 kg 76.8 kg    REVIEW OF SYSTEMS  PATIENT IS UNABLE TO PROVIDE COMPLETE REVIEW OF SYSTEMS DUE TO SEVERE CRITICAL ILLNESS AND TOXIC METABOLIC ENCEPHALOPATHY   PHYSICAL EXAMINATION:  GENERAL:critically ill appearing, +resp distress EYES: Pupils equal, round, reactive to light.  No scleral icterus.  MOUTH: Moist mucosal membrane trach with vent support NECK: Supple.  PULMONARY: +rhonchi,  CARDIOVASCULAR: S1 and S2.  No murmurs  GASTROINTESTINAL: Soft, nontender, -distended. Positive bowel sounds.  MUSCULOSKELETAL: No swelling, clubbing, or edema.  NEUROLOGIC:  obtunded SKIN:intact,warm,dry    Labs/imaging that I havepersonally reviewed  (right click and "Reselect all SmartList Selections" daily)      ASSESSMENT AND PLAN SYNOPSIS  Multiorgan failure Pneumonia with severe hypoxic resp failure with septic shock and acute ischemic cardiomyopathy with NSTEMI with acute renal failure and liver failure and acidosis   .kkrep  Severe ACUTE Hypoxic and Hypercapnic Respiratory Failure -continue Mechanical Ventilator support -continue Bronchodilator Therapy -Wean Fio2 and PEEP as tolerated -VAP/VENT bundle implementation -will NOT perform SAT/SBT when respiratory parameters are met  Vent Mode: PRVC FiO2 (%):  [35 %-40 %] 40 % Set Rate:  [20 bmp] 20 bmp Vt Set:  [450 mL] 450 mL PEEP:  [5 cmH20] 5 cmH20 Pressure Support:  [5 cmH20-8 cmH20] 5 cmH20 Plateau Pressure:  [10 cmH20] 10 cmH20  CARDIAC FAILURE-acute NSTEMI  -oxygen as needed Follow up cardiology recs On heparin infusion   CARDIAC ICU monitoring  ACUTE KIDNEY INJURY/Renal Failure -continue Foley Catheter-assess need -Avoid nephrotoxic agents -Follow urine output, BMP -Ensure adequate renal perfusion, optimize oxygenation -Renal dose medications   Intake/Output Summary (Last 24 hours) at 11/02/2020 3335 Last data filed at 11/02/2020 0505 Gross per 24 hour  Intake 1323.45 ml  Output 1540 ml  Net -216.55 ml     NEUROLOGY Acute toxic metabolic encephalopathy, Avoid sedatives for now MRI shows ischemic vs CO poisoning  SEPTIC shock SOURCE-PNEUMONIA -use vasopressors to keep MAP>65 as needed -follow ABG and LA as needed -follow up cultures -emperic ABX  ENDO - ICU hypoglycemic\Hyperglycemia protocol -check FSBS per protocol   GI GI PROPHYLAXIS as indicated  NUTRITIONAL STATUS DIET-->TF's as tolerated Constipation protocol as indicated  ELECTROLYTES -follow labs as needed -replace as needed -pharmacy consultation and following    Best practice  (right click and "Reselect all SmartList Selections" daily)  Diet:  NPO Pain/Anxiety/Delirium protocol (if indicated): No VAP protocol (if indicated): Yes DVT prophylaxis: Systemic AC GI prophylaxis: H2B Glucose control:  SSI Yes Central venous access:  N/A Arterial line:  N/A Foley:  Yes, and it is still needed Mobility:  bed rest  Code Status:  FULL CODE Disposition: ICU  Labs   CBC: Recent Labs  Lab 10/30/20 2211 10/31/20 0421 11/01/20 0434  WBC 9.9 9.8 7.0  NEUTROABS  --   --  6.5  HGB 13.7 11.3* 11.4*  HCT 39.6 33.0* 33.1*  MCV 88.8 88.5 86.4  PLT 165 139* 154     Basic Metabolic Panel: Recent Labs  Lab 10/30/20 2211 10/31/20 0151 10/31/20 0421 11/01/20 0434  NA 129*  --  130* 132*  K 6.3* 5.1 5.0 4.5  CL 90*  --  98 102  CO2 26  --  24 23  GLUCOSE 106*  --  103* 132*  BUN 34*  --  38* 58*  CREATININE 3.64*  --  3.16* 3.81*  CALCIUM 8.5*  --  7.5* 7.8*  MG  --   --  2.2  2.3  PHOS  --   --  5.5* 4.0    GFR: Estimated Creatinine Clearance: 18.7 mL/min (A) (by C-G formula based on SCr of 3.81 mg/dL (H)). Recent Labs  Lab 10/30/20 2211 10/31/20 0152 10/31/20 0421 11/01/20 0434  PROCALCITON 1.73  --   --   --   WBC 9.9  --  9.8 7.0  LATICACIDVEN 2.8* 1.7  --   --      Liver Function Tests: Recent Labs  Lab 10/30/20 2211 10/31/20 0421 11/01/20 0434  AST 7,291* 4,293* 1,490*  ALT 2,441* 2,410* 1,790*  ALKPHOS 91 74 77  BILITOT 1.4* 0.9 0.6  PROT 8.0 6.1* 5.8*  ALBUMIN 2.9* 2.5* 2.3*    No results for input(s): LIPASE, AMYLASE in the last 168 hours. Recent Labs  Lab 10/31/20 0006 11/01/20 0434  AMMONIA 51* 34     ABG    Component Value Date/Time   PHART 7.31 (L) 10/31/2020 0108   PCO2ART 51 (H) 10/31/2020 0108   PO2ART 169 (H) 10/31/2020 0108   HCO3 22.7 10/31/2020 0910   ACIDBASEDEF 5.1 (H) 10/31/2020 0910   O2SAT 96.9 11/02/2020 0218      Coagulation Profile: Recent Labs  Lab 10/30/20 2211  INR 1.9*     Cardiac  Enzymes: Recent Labs  Lab 10/30/20 2211 10/31/20 0421 11/01/20 0434  CKTOTAL 16,180* 12,478* 5,684*     HbA1C: Hgb A1c MFr Bld  Date/Time Value Ref Range Status  10/31/2020 08:59 AM 5.5 4.8 - 5.6 % Final    Comment:    (NOTE)         Prediabetes: 5.7 - 6.4         Diabetes: >6.4         Glycemic control for adults with diabetes: <7.0     CBG: Recent Labs  Lab 11/01/20 1129 11/01/20 1516 11/01/20 1927 11/02/20 0037 11/02/20 0421  GLUCAP 111* 118* 130* 185* 111*     Allergies No Known Allergies     DVT/GI PRX  assessed I Assessed the need for Labs I Assessed the need for Foley I Assessed the need for Central Venous Line Family Discussion when available I Assessed the need for Mobilization I made an Assessment of medications to be adjusted accordingly Safety Risk assessment completed  CASE DISCUSSED IN MULTIDISCIPLINARY ROUNDS WITH ICU TEAM     Critical Care Time devoted to patient care services described in this note is 55  minutes.  Critical care was necessary to treat /prevent imminent and life-threatening deterioration. Overall, patient is critically ill, prognosis is guarded.  Patient with Multiorgan failure and at high risk for cardiac arrest and death.

## 2020-11-02 NOTE — Progress Notes (Signed)
Neurology Progress Note  Patient ID: Ronald Murphy is a 60 y.o. 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 (s/p completion of radiation treatments in June 2019 with chemotherapy begun but truncated secondary to tolerability, known C1 metastasis s/p [employer]ife radiation treatment and chemotherapy, most recently on durvalumab which was started 02/04/2019 with last infusion on 02/23/2020), hypertension, alcoholism in remission, anxiety/depression, kidney stones  Initially consulted for: AMS and bilateral basal ganglia lesions  Subjective: -Continues to be more alert, awake, oriented but still intermittently confused  Exam: Current vital signs: BP (!) 160/103   Pulse 94   Temp 98.5 F (36.9 C) (Oral)   Resp 14   Ht 5\' 3"  (1.6 m)   Wt 76.8 kg   SpO2 96%   BMI 29.99 kg/m  Vital signs in last 24 hours: Temp:  [97.2 F (36.2 C)-98.8 F (37.1 C)] 98.5 F (36.9 C) (10/20 1330) Pulse Rate:  [62-103] 94 (10/20 1800) Resp:  [8-29] 14 (10/20 1800) BP: (63-160)/(52-146) 160/103 (10/20 1800) SpO2:  [93 %-100 %] 96 % (10/20 1800) FiO2 (%):  [28 %-40 %] 28 % (10/20 1601)   Gen: In bed, comfortable  Resp: non-labored breathing, no grossly audible wheezing Cardiac: Perfusing extremities well  Abd: soft, nt  Neuro: MS: Much more alert today, readily following commands, oriented to month and year, but slow to respond and still has some poor attention CN: No gaze preference today, EOMI, PERRL, face symmetric, tongue midline Motor: Moving all 4 extremities equally antigravity.  Continues to have a tremor more notable on the right arm and leg compared to the left. Coordination: Intention tremor worse on the right than the left upper extremity that it is bilateral, on finger-to-nose testing Sensory: Equally reactive to touch in all 4 extremities  Pertinent Data:  Basic Metabolic Panel: Recent Labs  Lab 10/30/20 2211 10/31/20 0151  10/31/20 0421 11/01/20 0434 11/02/20 0820  NA 129*  --  130* 132* 136  K 6.3* 5.1 5.0 4.5 4.4  CL 90*  --  98 102 101  CO2 26  --  24 23 23   GLUCOSE 106*  --  103* 132* 131*  BUN 34*  --  38* 58* 86*  CREATININE 3.64*  --  3.16* 3.81* 3.96*  CALCIUM 8.5*  --  7.5* 7.8* 8.3*  MG  --   --  2.2 2.3 2.5*  PHOS  --   --  5.5* 4.0 3.8    CBC: Recent Labs  Lab 10/30/20 2211 10/31/20 0421 11/01/20 0434 11/02/20 0820  WBC 9.9 9.8 7.0 12.8*  NEUTROABS  --   --  6.5 11.7*  HGB 13.7 11.3* 11.4* 13.2  HCT 39.6 33.0* 33.1* 39.0  MCV 88.8 88.5 86.4 87.1  PLT 165 139* 154 208      Lab Results  Component Value Date   CKTOTAL 2,866 (H) 11/02/2020  Troponin also improving  ECHO 10/18  1. Left ventricular ejection fraction, by estimation, is >55%. The left  ventricle has normal function. Left ventricular endocardial border not  optimally defined to evaluate regional wall motion. Left ventricular  diastolic parameters are consistent with  Grade I diastolic dysfunction (impaired relaxation). Elevated left atrial  pressure.   2. Right ventricular systolic function is moderately reduced. The right  ventricular size is normal. Moderately increased right ventricular wall  thickness. Tricuspid regurgitation signal is inadequate for assessing PA  pressure.   3. A small pericardial effusion is present.  4. The mitral valve is abnormal. No evidence of mitral valve  regurgitation. No evidence of mitral stenosis. Moderate to severe mitral  annular calcification.   5. The aortic valve has an indeterminant number of cusps. There is  moderate calcification of the aortic valve. There is moderate thickening  of the aortic valve. Aortic valve regurgitation is not visualized. There  is at least mild aortic stenosis, though   evaluation is limited by suboptimal windows.   6. Agitated saline contrast bubble study was negative, with no evidence  of any interatrial shunt.   Lab Results  Component  Value Date   TSH 3.390 10/31/2020   T4TOTAL 4.6 10/31/2020   Lab Results  Component Value Date   VITAMINB12 1,486 (H) 11/01/2020      10/18 routine EEG:  ABNORMALITY - Continuous slow, generalized IMPRESSION: This study is suggestive of moderate diffuse encephalopathy, nonspecific etiology. No seizures or epileptiform discharges were seen throughout the recording.  Impression: Mental status is continuing to improve though unfortunately the patient's renal function remains significantly worse than his baseline.  Appreciate continued excellent critical care.  Unclear significance of the low level elevation of his carboxyhemoglobin.  Do think it is useful to review with family any potential sources of carbon monoxide exposure as well as with the patient when his mental status improves.  However these lesions may be in the setting of his acute renal failure and hepatic failure given the severity of his toxic/metabolic derangements on admission.  Additionally I wonder that they may be contribution to the tremor he is experiencing.   Recommendations: - B12, MMA, thiamine levels given he was reportedly sleepy for a few weeks at home and potentially not eating well; note he was started on a nutritional supplement prior to these labs being collected and therefore they may not be reliable.  However we will hold off on empiric supplementation at this time given he is improving -No further inpatient neurological work-up is indicated.  When closer to discharge, if patient prefers to follow-up locally he should be given the number for the [facility] clinic, otherwise can be referred to [facility]  Lesleigh Noe MD-PhD [facility] [phone]   Greater than 25 minutes were spent in care of this patient today of which greater than 50% was at bedside

## 2020-11-02 NOTE — Progress Notes (Signed)
PHARMACY CONSULT NOTE - FOLLOW UP  Pharmacy Consult for Electrolyte Monitoring and Replacement   Recent Labs: Potassium (mmol/L)  Date Value  11/02/2020 4.4   Magnesium (mg/dL)  Date Value  11/02/2020 2.5 (H)   Calcium (mg/dL)  Date Value  11/02/2020 8.3 (L)   Albumin (g/dL)  Date Value  11/02/2020 2.6 (L)   Phosphorus (mg/dL)  Date Value  11/02/2020 3.8   Sodium (mmol/L)  Date Value  11/02/2020 136     Assessment: 60 year old male presented with AMS. Patient found to have elevated creatinine and LFTs. Concern for NSTEMI with increased troponin; he received 48 hours of heparin. He has a history of laryngeal cancer with tracheostomy in place. Patient admitted to the ICU. Pharmacy consult to manage electrolytes.  Goal of Therapy:  Electrolytes WNL  Plan:  --Creatinine increasing --No replacement indicated today --Follow up electrolytes with morning labs  Tawnya Crook, PharmD, BCPS Clinical Pharmacist 11/02/2020 11:21 AM

## 2020-11-02 NOTE — Progress Notes (Signed)
[facility] Encounter Note  Patient: Ronald Murphy / Admit Date: 10/30/2020 / Date of Encounter: 11/02/2020, 9:51 AM   Subjective: 60 year old male with a past medical history of laryngeal cancer status post tracheostomy who presented to the ED with concerns of altered mental status per the patient's son. Patient was last seen well around 1 PM on Monday, 10/30/2020.  Patient's son had described the patient leaning to his right side and unable to follow commands or speak.  Patient is normally ambulatory, alert and oriented and able to care for himself.  Initial labs in the ED showed a troponin of 17,000 with severe transaminitis, acute kidney injury.  Chest x-ray showed diffuse hazy opacity in the left thorax.  CT of the head showed bilateral basal ganglia lacunar infarcts likely chronic.  He had a peak troponin of 19,822 trending downward.  Patient appears more aware of this morning.  Still unable to converse due to tracheostomy tube but was able to nod yes or no to specific questions.   Patient has had some weaning of Levophed yesterday with a possible opportunity to add beta-blocker for heart rate control and myocardial infarction but still had significant amount of hypotension requiring stoppage of metoprolol and reinstatement of Levophed.  Overall appears to be hemodynamically stable with despite some tachycardia into the 100 to 110 bpm range  Echocardiogram showed normal LV systolic function with an EF of greater than 55%.  Moderately reduced right ventricular systolic function.  Moderate to severe mitral annular calcification, moderate aortic valve thickening, negative bubble study.  Review of Systems: Positive for: Shortness of breath Negative for: Vision change, hearing change, syncope, dizziness, nausea, vomiting,diarrhea, bloody stool, stomach pain, cough, congestion, diaphoresis, urinary frequency, urinary pain,skin lesions, skin rashes Others previously  listed  Objective: Telemetry: Sinus tachycardia Physical Exam: Blood pressure (!) 133/105, pulse 99, temperature (!) 97.2 F (36.2 C), temperature source Oral, resp. rate (!) 22, height 5' 3"  (1.6 m), weight 76.8 kg, SpO2 96 %. Body mass index is 29.99 kg/m. General: Critically ill, ventilated Head: Normocephalic, atraumatic, sclera non-icteric, no xanthomas, nares are without discharge. Neck: No apparent masses Lungs: Increased respirations with some wheezes, diffuse rhonchi, no rales , no crackles   Heart: Regular rate and rhythm, normal S1 S2, no murmur, no rub, no gallop, PMI is normal size and placement, carotid upstroke normal without bruit, jugular venous pressure normal Abdomen: Soft, non-tender, non-distended with normoactive bowel sounds. No hepatosplenomegaly. Abdominal aorta is normal size without bruit Extremities: Trace edema, no clubbing, no cyanosis, no ulcers,  Peripheral: 2+ radial, 2+ femoral, 2+ dorsal pedal pulses Neuro: Alert, unable to respond to questions Psych: Unable to respond to questions   Intake/Output Summary (Last 24 hours) at 11/02/2020 0951 Last data filed at 11/02/2020 0505 Gross per 24 hour  Intake 1293.45 ml  Output 1540 ml  Net -246.55 ml     Inpatient Medications:  . budesonide (PULMICORT) nebulizer solution  0.5 mg Nebulization BID  . chlorhexidine gluconate (MEDLINE KIT)  15 mL Mouth Rinse BID  . Chlorhexidine Gluconate Cloth  6 each Topical Daily  . escitalopram  20 mg Per Tube Daily  . free water  30 mL Per Tube Q4H  . ipratropium-albuterol  3 mL Nebulization Q6H  . lactulose  30 g Per Tube BID  . mouth rinse  15 mL Mouth Rinse 10 times per day  . methylPREDNISolone (SOLU-MEDROL) injection  40 mg Intravenous Q24H  . morphine  30 mg Oral TID  .  nutrition supplement (JUVEN)  1 packet Per Tube BID BM  . pantoprazole (PROTONIX) IV  40 mg Intravenous QHS   Infusions:  . sodium chloride    . sodium chloride    . azithromycin 500 mg  (11/01/20 2355)  . cefTRIAXone (ROCEPHIN)  IV 1 g (11/01/20 2306)  . feeding supplement (VITAL AF 1.2 CAL) 1,000 mL (11/01/20 1712)  . heparin 1,650 Units/hr (11/02/20 0748)  . norepinephrine (LEVOPHED) Adult infusion      Labs: Recent Labs    11/01/20 0434 11/02/20 0820  NA 132* 136  K 4.5 4.4  CL 102 101  CO2 23 23  GLUCOSE 132* 131*  BUN 58* 86*  CREATININE 3.81* 3.96*  CALCIUM 7.8* 8.3*  MG 2.3 2.5*  PHOS 4.0 3.8    Recent Labs    11/01/20 0434 11/02/20 0820  AST 1,490* 711*  ALT 1,790* 1,460*  ALKPHOS 77 100  BILITOT 0.6 0.6  PROT 5.8* 6.5  ALBUMIN 2.3* 2.6*    Recent Labs    11/01/20 0434 11/02/20 0820  WBC 7.0 12.8*  NEUTROABS 6.5 11.7*  HGB 11.4* 13.2  HCT 33.1* 39.0  MCV 86.4 87.1  PLT 154 208    Recent Labs    10/30/20 2211 10/31/20 0421 11/01/20 0434 11/02/20 0820  CKTOTAL 16,180* 12,478* 5,684* 2,866*    Invalid input(s): POCBNP Recent Labs    10/31/20 0859  HGBA1C 5.5      Weights: Filed Weights   10/30/20 2212 11/01/20 0457  Weight: 72.8 kg 76.8 kg     Radiology/Studies:  CT Abdomen Pelvis Wo Contrast  Result Date: 10/31/2020 CLINICAL DATA:  Abdominal pain EXAM: CT ABDOMEN AND PELVIS WITHOUT CONTRAST TECHNIQUE: Multidetector CT imaging of the abdomen and pelvis was performed following the standard protocol without IV contrast. COMPARISON:  None. FINDINGS: Lower chest: Patchy opacities in the bilateral lower lungs, suspicious for pneumonia. Hepatobiliary: Liver is within normal limits. Layering noncalcified gallstones versus gallbladder sludge (series 2/image 28), without associated inflammatory changes. No intrahepatic or extrahepatic duct dilatation. Pancreas: Within normal limits. Spleen: Within normal limits. Adrenals/Urinary Tract: Adrenal glands are within normal limits. Multiple nonobstructing right lower pole renal calculi measuring up to 7 mm (series 2/image 34). Left kidney is within normal limits. No hydronephrosis.  Mildly thick-walled bladder. Stomach/Bowel: Stomach is within normal limits. No evidence of bowel obstruction. Normal appendix (series 2/image 56). Sigmoid diverticulosis, without evidence of diverticulitis. Vascular/Lymphatic: No evidence of abdominal aortic aneurysm. Atherosclerotic calcifications of the abdominal aorta and branch vessels. No suspicious abdominopelvic lymphadenopathy. Reproductive: Prostate is unremarkable. Other: No abdominopelvic ascites. Tiny fat containing periumbilical hernia (series 2/image 48). Mild fat in the bilateral inguinal canals (series 2/image 76). Musculoskeletal: Degenerative changes of the visualized thoracolumbar spine. IMPRESSION: No evidence of bowel obstruction.  Normal appendix. Sigmoid diverticulosis, without evidence of diverticulitis. Multiple nonobstructing right lower pole renal calculi measuring up to 7 mm. No hydronephrosis. Cholelithiasis, without associated inflammatory changes. Electronically Signed   By: Julian Hy M.D.   On: 10/31/2020 00:18   DG Abd 1 View  Result Date: 11/01/2020 CLINICAL DATA:  NG tube placement EXAM: ABDOMEN - 1 VIEW COMPARISON:  10/31/2020 FINDINGS: NG tube tip is in the stomach. Nonobstructive bowel gas pattern. No visible free air. Left base atelectasis or infiltrate. IMPRESSION: NG tube tip in the stomach. Electronically Signed   By: Rolm Baptise M.D.   On: 11/01/2020 16:45   DG Abd 1 View  Result Date: 10/31/2020 CLINICAL DATA:  Encounter for imaging study to  confirm nasogastric (NG) tube placement Z01.89 (ICD-10-CM) EXAM: ABDOMEN - 1 VIEW COMPARISON:  CT 10/30/2020 FINDINGS: Limited radiograph of the lower chest and upper abdomen was obtained for the purposes of enteric tube localization. Enteric tube is seen coursing below the diaphragm with distal tip and side port terminating within the expected location of the gastric body. Visualized bowel gas pattern is nonobstructive. Patchy bibasilar opacities. IMPRESSION:  Enteric tube tip and side port terminating within the expected location of the gastric body. Electronically Signed   By: Davina Poke D.O.   On: 10/31/2020 14:18   CT HEAD WO CONTRAST (5MM)  Result Date: 10/30/2020 CLINICAL DATA:  Mental status change, unknown cause EXAM: CT HEAD WITHOUT CONTRAST TECHNIQUE: Contiguous axial images were obtained from the base of the skull through the vertex without intravenous contrast. COMPARISON:  None. FINDINGS: Brain: Low-density areas in the basal ganglia bilaterally compatible with acute infarcts, age indeterminate, favor chronic. No hemorrhage or hydrocephalus. Vascular: No hyperdense vessel or unexpected calcification. Skull: No acute calvarial abnormality. Sinuses/Orbits: No acute findings Other: None IMPRESSION: Bilateral basal ganglia lacunar infarcts, age indeterminate but favor chronic. Electronically Signed   By: Rolm Baptise M.D.   On: 10/30/2020 23:58   MR ANGIO HEAD WO CONTRAST  Result Date: 11/01/2020 CLINICAL DATA:  Stroke, follow-up EXAM: MRI HEAD WITHOUT CONTRAST MRA HEAD WITHOUT CONTRAST TECHNIQUE: Multiplanar, multi-echo pulse sequences of the brain and surrounding structures were acquired without intravenous contrast. Angiographic images of the Circle of Willis were acquired using MRA technique without intravenous contrast. COMPARISON:  No prior MRI, correlation is made with CT head 10/30/2020 FINDINGS: MRI HEAD FINDINGS Brain: Symmetric focal areas of hyperintense signal on diffusion-weighted imaging in the bilateral globi palladi (series 5, image 26), without ADC correlate (series 6, image 26), and associated with increased T2 signal (series 15, image 29), likely subacute infarcts. No acute hemorrhage or hemosiderin deposition to suggest remote hemorrhage. No hydrocephalus, mass, mass effect, or midline shift. Vascular: See MRI findings below. Skull and upper cervical spine: Normal marrow signal. Sinuses/Orbits: Mucosal thickening in the  ethmoid air cells. Otherwise negative. Other: Fluid in the bilateral mastoid air cells. MRA HEAD FINDINGS Anterior circulation: Both internal carotid arteries are patent to the termini, with irregularity in the left-greater-than-right cavernous portions, likely related to atherosclerotic disease, but without focal stenosis or occlusion. A1 segments patent. Normal anterior communicating artery. Anterior cerebral arteries are patent to their distal aspects. No M1 stenosis or occlusion. Normal MCA bifurcations. Evaluation of the distal MCA branches is somewhat limited by the degree of motion artifact on the slices, however no focal occlusion is seen. Posterior circulation: Vertebral arteries patent to the vertebrobasilar junction without stenosis. Basilar patent to its distal aspect. Superior cerebral arteries patent bilaterally. PCAs perfused to their distal aspects without focal stenosis. Diminutive left P1 with a patent left posterior communicating artery. Anatomic variants: None significant. IMPRESSION: 1. Focal, symmetric increased signal on diffusion-weighted imaging in the bilateral globi palladi, without ADC correlate, and associated with increased T2 signal. While these signal characteristics can be seen in the setting of subacute ischemia, the symmetry of these finding would be unusual for ischemia but can be seen in the setting of carbon monoxide poisoning, with other etiologies including mitochondrial or toxic encephalopathies. 2. Evaluation of the intracranial vasculature somewhat limited by motion artifact. Within this limitation, no large vessel occlusion or significant focal stenosis. These results were called by telephone at the time of interpretation on 11/01/2020 at 10:23 pm to provider Rufina Falco,  who verbally acknowledged these results. Electronically Signed   By: Merilyn Baba M.D.   On: 11/01/2020 22:31   MR BRAIN WO CONTRAST  Result Date: 11/01/2020 CLINICAL DATA:  Stroke, follow-up  EXAM: MRI HEAD WITHOUT CONTRAST MRA HEAD WITHOUT CONTRAST TECHNIQUE: Multiplanar, multi-echo pulse sequences of the brain and surrounding structures were acquired without intravenous contrast. Angiographic images of the Circle of Willis were acquired using MRA technique without intravenous contrast. COMPARISON:  No prior MRI, correlation is made with CT head 10/30/2020 FINDINGS: MRI HEAD FINDINGS Brain: Symmetric focal areas of hyperintense signal on diffusion-weighted imaging in the bilateral globi palladi (series 5, image 26), without ADC correlate (series 6, image 26), and associated with increased T2 signal (series 15, image 29), likely subacute infarcts. No acute hemorrhage or hemosiderin deposition to suggest remote hemorrhage. No hydrocephalus, mass, mass effect, or midline shift. Vascular: See MRI findings below. Skull and upper cervical spine: Normal marrow signal. Sinuses/Orbits: Mucosal thickening in the ethmoid air cells. Otherwise negative. Other: Fluid in the bilateral mastoid air cells. MRA HEAD FINDINGS Anterior circulation: Both internal carotid arteries are patent to the termini, with irregularity in the left-greater-than-right cavernous portions, likely related to atherosclerotic disease, but without focal stenosis or occlusion. A1 segments patent. Normal anterior communicating artery. Anterior cerebral arteries are patent to their distal aspects. No M1 stenosis or occlusion. Normal MCA bifurcations. Evaluation of the distal MCA branches is somewhat limited by the degree of motion artifact on the slices, however no focal occlusion is seen. Posterior circulation: Vertebral arteries patent to the vertebrobasilar junction without stenosis. Basilar patent to its distal aspect. Superior cerebral arteries patent bilaterally. PCAs perfused to their distal aspects without focal stenosis. Diminutive left P1 with a patent left posterior communicating artery. Anatomic variants: None significant. IMPRESSION:  1. Focal, symmetric increased signal on diffusion-weighted imaging in the bilateral globi palladi, without ADC correlate, and associated with increased T2 signal. While these signal characteristics can be seen in the setting of subacute ischemia, the symmetry of these finding would be unusual for ischemia but can be seen in the setting of carbon monoxide poisoning, with other etiologies including mitochondrial or toxic encephalopathies. 2. Evaluation of the intracranial vasculature somewhat limited by motion artifact. Within this limitation, no large vessel occlusion or significant focal stenosis. These results were called by telephone at the time of interpretation on 11/01/2020 at 10:23 pm to provider Rufina Falco, who verbally acknowledged these results. Electronically Signed   By: Merilyn Baba M.D.   On: 11/01/2020 22:31   DG Chest Portable 1 View  Result Date: 10/30/2020 CLINICAL DATA:  Hypoxia EXAM: PORTABLE CHEST 1 VIEW COMPARISON:  None. FINDINGS: Tracheostomy tube tip about 5.8 cm superior to carina. The right lung shows no focal airspace disease. The left lung shows mild diffuse ground-glass opacity. No pleural effusion. Normal cardiac size. No pneumothorax. IMPRESSION: Diffuse hazy opacity in the left thorax which may be secondary to pneumonia Electronically Signed   By: Donavan Foil M.D.   On: 10/30/2020 22:44   EEG adult  Result Date: 10/31/2020 Lora Havens, MD     10/31/2020  4:12 PM Patient Name: Ronald Murphy MRN: [id number] Epilepsy Attending: Lora Havens Referring Physician/Provider: Rust-Chester, Huel Cote, NP Date: 10/31/2020 Duration: 23.50 mins Patient history: 60 year old male with altered mental status.  EEG to evaluate for seizure. Level of alertness:  lethargic AEDs during EEG study: None Technical aspects: This EEG study was done with scalp electrodes positioned according to the 10-20 International system  of electrode placement. Electrical activity was acquired at a  sampling rate of 500Hz  and reviewed with a high frequency filter of 70Hz  and a low frequency filter of 1Hz . EEG data were recorded continuously and digitally stored. Description: No posterior dominant rhythm was seen. EEG showed continuous generalized 3 to 6 Hz theta-delta slowing. Hyperventilation and photic stimulation were not performed.   ABNORMALITY - Continuous slow, generalized IMPRESSION: This study is suggestive of moderate diffuse encephalopathy, nonspecific etiology. No seizures or epileptiform discharges were seen throughout the recording. Lora Havens   ECHOCARDIOGRAM COMPLETE BUBBLE STUDY  Result Date: 10/31/2020    ECHOCARDIOGRAM REPORT   Patient Name:   Ronald Murphy Date of Exam: 10/31/2020 Medical Rec #:  [id number]      Height:       63.0 in Accession #:    [id number]     Weight:       160.5 lb Date of Birth:  1960/07/17      BSA:          1.761 m Patient Age:    9 years       BP:           106/70 mmHg Patient Gender: M              HR:           89 bpm. Exam Location:  [facility] Procedure: 2D Echo, Cardiac Doppler, Color Doppler and Saline Contrast Bubble            Study Indications:     Stroke 434.91 / I63.9  History:         Patient has no prior history of Echocardiogram examinations.                  Risk Factors:Hypertension.  Sonographer:     Sherrie Sport Referring Phys:  [id number] BRITTON L RUST-CHESTER Diagnosing Phys: Harrell Gave End MD  Sonographer Comments: Echo performed with patient supine and on artificial respirator and no parasternal window. IMPRESSIONS  1. Left ventricular ejection fraction, by estimation, is >55%. The left ventricle has normal function. Left ventricular endocardial border not optimally defined to evaluate regional wall motion. Left ventricular diastolic parameters are consistent with Grade I diastolic dysfunction (impaired relaxation). Elevated left atrial pressure.  2. Right ventricular systolic function is moderately reduced. The right ventricular size is  normal. Moderately increased right ventricular wall thickness. Tricuspid regurgitation signal is inadequate for assessing PA pressure.  3. A small pericardial effusion is present.  4. The mitral valve is abnormal. No evidence of mitral valve regurgitation. No evidence of mitral stenosis. Moderate to severe mitral annular calcification.  5. The aortic valve has an indeterminant number of cusps. There is moderate calcification of the aortic valve. There is moderate thickening of the aortic valve. Aortic valve regurgitation is not visualized. There is at least mild aortic stenosis, though  evaluation is limited by suboptimal windows.  6. Agitated saline contrast bubble study was negative, with no evidence of any interatrial shunt. FINDINGS  Left Ventricle: Left ventricular ejection fraction, by estimation, is >55%. The left ventricle has normal function. Left ventricular endocardial border not optimally defined to evaluate regional wall motion. The left ventricular internal cavity size was  normal in size. There is no left ventricular hypertrophy. Left ventricular diastolic parameters are consistent with Grade I diastolic dysfunction (impaired relaxation). Elevated left atrial pressure. Right Ventricle: The right ventricular size is normal. Moderately increased right ventricular wall thickness. Right ventricular systolic function  is moderately reduced. Tricuspid regurgitation signal is inadequate for assessing PA pressure. Left Atrium: Left atrial size was normal in size. Right Atrium: Right atrial size was normal in size. Pericardium: A small pericardial effusion is present. Mitral Valve: The mitral valve is abnormal. Moderate to severe mitral annular calcification. No evidence of mitral valve regurgitation. No evidence of mitral valve stenosis. MV peak gradient, 3.9 mmHg. The mean mitral valve gradient is 2.0 mmHg. Tricuspid Valve: The tricuspid valve is not well visualized. Tricuspid valve regurgitation is trivial.  Aortic Valve: The aortic valve has an indeterminant number of cusps. There is moderate calcification of the aortic valve. There is moderate thickening of the aortic valve. Aortic valve regurgitation is not visualized. There is at least mild aortic stenosis, though evaluation is limited by suboptimal windows. Aortic valve mean gradient measures 10.0 mmHg. Aortic valve peak gradient measures 12.4 mmHg. Aortic valve area, by VTI measures 1.59 cm. Pulmonic Valve: The pulmonic valve was not well visualized. Pulmonic valve regurgitation is not visualized. No evidence of pulmonic stenosis. Aorta: The aortic root was not well visualized. Pulmonary Artery: The pulmonary artery is of normal size. Venous: IVC assessment for right atrial pressure unable to be performed due to mechanical ventilation. IAS/Shunts: The interatrial septum was not well visualized. Agitated saline contrast was given intravenously to evaluate for intracardiac shunting. Agitated saline contrast bubble study was negative, with no evidence of any interatrial shunt.  LEFT VENTRICLE PLAX 2D LVIDd:         3.40 cm   Diastology LVIDs:         2.00 cm   LV e' medial:    3.59 cm/s LV PW:         0.90 cm   LV E/e' medial:  20.1 LV IVS:        0.90 cm   LV e' lateral:   6.20 cm/s LVOT diam:     2.00 cm   LV E/e' lateral: 11.6 LV SV:         41 LV SV Index:   23 LVOT Area:     3.14 cm  RIGHT VENTRICLE RV S prime:     14.10 cm/s TAPSE (M-mode): 2.8 cm LEFT ATRIUM           Index        RIGHT ATRIUM          Index LA diam:      3.10 cm 1.76 cm/m   RA Area:     6.72 cm LA Vol (A4C): 19.9 ml 11.30 ml/m  RA Volume:   12.20 ml 6.93 ml/m  AORTIC VALVE                     PULMONIC VALVE AV Area (Vmax):    0.99 cm      PV Vmax:        0.83 m/s AV Area (Vmean):   1.11 cm      PV Peak grad:   2.7 mmHg AV Area (VTI):     1.59 cm      RVOT Peak grad: 2 mmHg AV Vmax:           176.00 cm/s AV Vmean:          116.500 cm/s AV VTI:            0.257 m AV Peak Grad:       12.4 mmHg AV Mean Grad:      10.0 mmHg LVOT Vmax:  55.50 cm/s LVOT Vmean:        41.000 cm/s LVOT VTI:          0.130 m LVOT/AV VTI ratio: 0.51 MITRAL VALVE MV Area (PHT): 2.57 cm    SHUNTS MV Area VTI:   1.63 cm    Systemic VTI:  0.13 m MV Peak grad:  3.9 mmHg    Systemic Diam: 2.00 cm MV Mean grad:  2.0 mmHg MV Vmax:       0.99 m/s MV Vmean:      57.2 cm/s MV Decel Time: 295 msec MV E velocity: 72.00 cm/s MV A velocity: 84.00 cm/s MV E/A ratio:  0.86 Christopher End MD Electronically signed by [name] MD Signature Date/Time: 10/31/2020/5:39:22 PM    Final      Assessment and Recommendation  60 y.o. male critically ill with a past medical history of laryngeal cancer status post tracheostomy tube placement presenting with pneumonia with severe hypoxic respiratory failure, elevated troponin secondary to NSTEMI complicated by multiorgan failure now slowly improving.  Echocardiogram showed preserved ejection fraction and left ventricular systolic function.  Plan: -Continue heparin drip for total of 48 hours for management of suspected NSTEMI, then continue with single antiplatelet therapy after.  Would consider the possibility of dual antiplatelet therapy if patient would tolerate well for his non-ST elevation myocardial infarction\ -Plan to add beta-blocker for further management of NSTEMI with preserved ejection fraction when the patient is no longer in need of pressure support and/or Levophed for further treatment of myocardial infarction -Avoid ACE inhibitor's at this time due to acute kidney injury, will consider addition of this as an outpatient as needed -Continue conservative management of NSTEMI at this time due to concurrent multiorgan failure and current illness. -Continue current care and management for severe septic shock with a community-acquired pneumonia, hypoxic respiratory failure, metabolic encephalopathy, multiorgan failure. -No further cardiac diagnostics necessary at  this time -Begin ambulation and rehabilitation as able Signed, Jettie Booze, PA-C

## 2020-11-02 NOTE — Progress Notes (Signed)
ANTICOAGULATION CONSULT NOTE  Pharmacy Consult for heparin infusion Indication: ACS/STEMI  No Known Allergies  Patient Measurements: Height: 5\' 3"  (160 cm) Weight: 76.8 kg (169 lb 5 oz) IBW/kg (Calculated) : 56.9 Heparin Dosing Weight: 71.6 kg  Vital Signs: Temp: 99.4 F (37.4 C) (10/19 1721) Temp Source: Oral (10/19 1721) BP: 110/84 (10/19 2254) Pulse Rate: 76 (10/19 2254)  Labs: Recent Labs    10/30/20 2211 10/31/20 0152 10/31/20 0421 10/31/20 0539 10/31/20 0859 10/31/20 1200 10/31/20 2004 11/01/20 0434 11/01/20 1318 11/01/20 2309  HGB 13.7  --  11.3*  --   --   --   --  11.4*  --   --   HCT 39.6  --  33.0*  --   --   --   --  33.1*  --   --   PLT 165  --  139*  --   --   --   --  154  --   --   APTT 40*  --   --   --   --   --   --   --   --   --   LABPROT 21.5*  --   --   --   --   --   --   --   --   --   INR 1.9*  --   --   --   --   --   --   --   --   --   HEPARINUNFRC  --   --   --   --  <0.10*  --    < > 0.15* 0.33 0.23*  CREATININE 3.64*  --  3.16*  --   --   --   --  3.81*  --   --   CKTOTAL 16,180*  --  12,478*  --   --   --   --  5,684*  --   --   TROPONINIHS 17,932*   < > 16,665* 17,317* 32,122* 11,936*  --   --   --   --    < > = values in this interval not displayed.     Estimated Creatinine Clearance: 18.7 mL/min (A) (by C-G formula based on SCr of 3.81 mg/dL (H)).   Medical History: Past Medical History:  Diagnosis Date   Alcoholism in remission ([place])    Anxiety    Depression    Hepatitis C    Hypertension    Kidney stone    Larynx cancer ([place]) 04/05/2017   Stage 3    Assessment: 60 y/o male with h/o SCC lung, laryngeal cancer s/p chemotherapy/XRT, radiation induced esophageal stricture s/p dilation, chronic trach, recurrent aspiration PNA, Hep C, anxiety, depression, etoh abuse (remission) and HTN who is admitted with lacunar infarct, CAP, septic shock and NSTEMI. Pharmacy consulted for heparin monitoring. He is being followed by  cardiology with plans to continue heparin drip for 48 hours. H&H, platelets noted to be trending down slightly.   Goal of Therapy:  Heparin level 0.3-0.7 units/ml Monitor platelets by anticoagulation protocol: Yes   Plan:  Heparin level subtherapeutic Bolus 1100 units x 1 Increase heparin to 1650 units/hr Recheck HL in 8 hr after rate change CBC daily while on heparin  Renda Rolls, PharmD, [facility] 11/02/2020 12:17 AM

## 2020-11-02 NOTE — Progress Notes (Signed)
Pt taken off vent and placed on trach collar, tolerating well at this time. RN aware.

## 2020-11-03 LAB — CBC WITH DIFFERENTIAL/PLATELET
Abs Immature Granulocytes: 0.31 10*3/uL — ABNORMAL HIGH (ref 0.00–0.07)
Basophils Absolute: 0 10*3/uL (ref 0.0–0.1)
Basophils Relative: 0 %
Eosinophils Absolute: 0 10*3/uL (ref 0.0–0.5)
Eosinophils Relative: 0 %
HCT: 33.6 % — ABNORMAL LOW (ref 39.0–52.0)
Hemoglobin: 11.3 g/dL — ABNORMAL LOW (ref 13.0–17.0)
Immature Granulocytes: 3 %
Lymphocytes Relative: 4 %
Lymphs Abs: 0.4 10*3/uL — ABNORMAL LOW (ref 0.7–4.0)
MCH: 29.1 pg (ref 26.0–34.0)
MCHC: 33.6 g/dL (ref 30.0–36.0)
MCV: 86.6 fL (ref 80.0–100.0)
Monocytes Absolute: 0.7 10*3/uL (ref 0.1–1.0)
Monocytes Relative: 6 %
Neutro Abs: 9.4 10*3/uL — ABNORMAL HIGH (ref 1.7–7.7)
Neutrophils Relative %: 87 %
Platelets: 206 10*3/uL (ref 150–400)
RBC: 3.88 MIL/uL — ABNORMAL LOW (ref 4.22–5.81)
RDW: 14.6 % (ref 11.5–15.5)
WBC: 10.8 10*3/uL — ABNORMAL HIGH (ref 4.0–10.5)
nRBC: 0.2 % (ref 0.0–0.2)

## 2020-11-03 LAB — GLUCOSE, CAPILLARY
Glucose-Capillary: 122 mg/dL — ABNORMAL HIGH (ref 70–99)
Glucose-Capillary: 156 mg/dL — ABNORMAL HIGH (ref 70–99)
Glucose-Capillary: 138 mg/dL — ABNORMAL HIGH (ref 70–99)
Glucose-Capillary: 134 mg/dL — ABNORMAL HIGH (ref 70–99)
Glucose-Capillary: 117 mg/dL — ABNORMAL HIGH (ref 70–99)
Glucose-Capillary: 137 mg/dL — ABNORMAL HIGH (ref 70–99)

## 2020-11-03 LAB — BLOOD GAS, ARTERIAL
Acid-base deficit: 0.8 mmol/L (ref 0.0–2.0)
Bicarbonate: 23.4 mmol/L (ref 20.0–28.0)
FIO2: 0.28
O2 Saturation: 95.2 %
Patient temperature: 37
pCO2 arterial: 36 mmHg (ref 32.0–48.0)
pH, Arterial: 7.42 (ref 7.350–7.450)
pO2, Arterial: 75 mmHg — ABNORMAL LOW (ref 83.0–108.0)

## 2020-11-03 LAB — COMPREHENSIVE METABOLIC PANEL
ALT: 854 U/L — ABNORMAL HIGH (ref 0–44)
AST: 263 U/L — ABNORMAL HIGH (ref 15–41)
Albumin: 2.3 g/dL — ABNORMAL LOW (ref 3.5–5.0)
Alkaline Phosphatase: 77 U/L (ref 38–126)
Anion gap: 8 (ref 5–15)
BUN: 99 mg/dL — ABNORMAL HIGH (ref 8–23)
CO2: 24 mmol/L (ref 22–32)
Calcium: 8.1 mg/dL — ABNORMAL LOW (ref 8.9–10.3)
Chloride: 108 mmol/L (ref 98–111)
Creatinine, Ser: 3.73 mg/dL — ABNORMAL HIGH (ref 0.61–1.24)
GFR, Estimated: 18 mL/min — ABNORMAL LOW (ref >60)
Glucose, Bld: 123 mg/dL — ABNORMAL HIGH (ref 70–99)
Potassium: 4.6 mmol/L (ref 3.5–5.1)
Sodium: 140 mmol/L (ref 135–145)
Total Bilirubin: 0.5 mg/dL (ref 0.3–1.2)
Total Protein: 5.6 g/dL — ABNORMAL LOW (ref 6.5–8.1)

## 2020-11-03 LAB — MAGNESIUM: Magnesium: 2.4 mg/dL (ref 1.7–2.4)

## 2020-11-03 LAB — CULTURE, RESPIRATORY W GRAM STAIN

## 2020-11-03 LAB — PHOSPHORUS: Phosphorus: 2.8 mg/dL (ref 2.5–4.6)

## 2020-11-03 LAB — CK: Total CK: 1588 U/L — ABNORMAL HIGH (ref 49–397)

## 2020-11-03 MED ORDER — ASPIRIN 81 MG PO CHEW
81.0000 mg | CHEWABLE_TABLET | Freq: Every day | ORAL | Status: DC
  Administered 2020-11-03 – 2020-11-15 (×13): 81 mg
  Filled 2020-11-03 (×13): qty 1

## 2020-11-03 MED ORDER — CEFAZOLIN SODIUM-DEXTROSE 1-4 GM/50ML-% IV SOLN
1.0000 g | Freq: Two times a day (BID) | INTRAVENOUS | Status: DC
  Administered 2020-11-03 – 2020-11-04 (×4): 1 g via INTRAVENOUS
  Filled 2020-11-03 (×6): qty 50

## 2020-11-03 NOTE — Progress Notes (Signed)
Patient placed on ATC @ 28% and cuff on Trach deflated. Tolerating well at this time, saturations are 96%.

## 2020-11-03 NOTE — Progress Notes (Signed)
PHARMACY CONSULT NOTE - FOLLOW UP  Pharmacy Consult for Electrolyte Monitoring and Replacement   Recent Labs: Potassium (mmol/L)  Date Value  11/03/2020 4.6   Magnesium (mg/dL)  Date Value  11/03/2020 2.4   Calcium (mg/dL)  Date Value  11/03/2020 8.1 (L)   Albumin (g/dL)  Date Value  11/03/2020 2.3 (L)   Phosphorus (mg/dL)  Date Value  11/03/2020 2.8   Sodium (mmol/L)  Date Value  11/03/2020 140     Assessment: 60 year old male presented with AMS. Patient found to have elevated creatinine and LFTs. Concern for NSTEMI with increased troponin; he received 48 hours of heparin. He has a history of laryngeal cancer with tracheostomy in place. Patient admitted to the ICU. Pharmacy consult to manage electrolytes.  Goal of Therapy:  Electrolytes WNL  Plan:  --Creatinine appears to have possibly peaked, now trending down --No replacement indicated today --Continue to follow along  Tawnya Crook, PharmD, BCPS Clinical Pharmacist 11/03/2020 11:44 AM

## 2020-11-03 NOTE — Progress Notes (Signed)
[facility] Encounter Note  Patient: Ronald Murphy / Admit Date: 10/30/2020 / Date of Encounter: 11/03/2020, 9:01 AM   Subjective: 60 year old male with a past medical history of laryngeal cancer status post tracheostomy who presented to the ED with concerns of altered mental status per the patient's son. Patient was last seen well around 1 PM on Monday, 10/30/2020.  Patient's son had described the patient leaning to his right side and unable to follow commands or speak.  Patient is normally ambulatory, alert and oriented and able to care for himself.  Initial labs in the ED showed a troponin of 17,000 with severe transaminitis, acute kidney injury.  Chest x-ray showed diffuse hazy opacity in the left thorax.  CT of the head showed bilateral basal ganglia lacunar infarcts likely chronic.  He had a peak troponin of 19,822 trending downward.  Patient appears more aware of this morning.  Still unable to converse due to tracheostomy tube but was able to nod yes or no to specific questions.   Patient has had some weaning of Levophed 10/19 with a possible opportunity to add beta-blocker for heart rate control and myocardial infarction but still had significant amount of hypotension requiring stoppage of metoprolol and reinstatement of Levophed.  Overall appears to be hemodynamically stable with improved heart rate this morning  Echocardiogram showed normal LV systolic function with an EF of greater than 55%.  Moderately reduced right ventricular systolic function.  Moderate to severe mitral annular calcification, moderate aortic valve thickening, negative bubble study.  Review of Systems: Positive for: Shortness of breath Negative for: Vision change, hearing change, syncope, dizziness, nausea, vomiting,diarrhea, bloody stool, stomach pain, cough, congestion, diaphoresis, urinary frequency, urinary pain,skin lesions, skin rashes Others previously listed  Objective: Telemetry: Sinus  tachycardia Physical Exam: Blood pressure 133/84, pulse (!) 102, temperature 98 F (36.7 C), temperature source Oral, resp. rate 14, height 5' 3" (1.6 m), weight 76.8 kg, SpO2 96 %. Body mass index is 29.99 kg/m. General: Critically ill, ventilated Head: Normocephalic, atraumatic, sclera non-icteric, no xanthomas, nares are without discharge. Neck: No apparent masses Lungs: Increased respirations with some wheezes, diffuse rhonchi, no rales , no crackles   Heart: Regular rate and rhythm, normal S1 S2, no murmur, no rub, no gallop, PMI is normal size and placement, carotid upstroke normal without bruit, jugular venous pressure normal Abdomen: Soft, non-tender, non-distended with normoactive bowel sounds. No hepatosplenomegaly. Abdominal aorta is normal size without bruit Extremities: Trace edema, no clubbing, no cyanosis, no ulcers,  Peripheral: 2+ radial, 2+ femoral, 2+ dorsal pedal pulses Neuro: Alert, unable to respond to questions Psych: Unable to respond to questions   Intake/Output Summary (Last 24 hours) at 11/03/2020 0901 Last data filed at 11/03/2020 0600 Gross per 24 hour  Intake 1952.17 ml  Output 1600 ml  Net 352.17 ml    Inpatient Medications:  . budesonide (PULMICORT) nebulizer solution  0.5 mg Nebulization BID  . chlorhexidine gluconate (MEDLINE KIT)  15 mL Mouth Rinse BID  . Chlorhexidine Gluconate Cloth  6 each Topical Daily  . escitalopram  20 mg Per Tube Daily  . free water  30 mL Per Tube Q4H  . heparin injection (subcutaneous)  5,000 Units Subcutaneous Q8H  . ipratropium-albuterol  3 mL Nebulization BID  . mouth rinse  15 mL Mouth Rinse 10 times per day  . methylPREDNISolone (SOLU-MEDROL) injection  40 mg Intravenous Q24H  . nutrition supplement (JUVEN)  1 packet Per Tube BID BM  . pantoprazole (PROTONIX) IV  40 mg Intravenous QHS   Infusions:  . sodium chloride    . sodium chloride    . azithromycin Stopped (11/03/20 0032)  . cefTRIAXone (ROCEPHIN)  IV  Stopped (11/02/20 2254)  . feeding supplement (VITAL AF 1.2 CAL) 1,000 mL (11/01/20 1712)  . norepinephrine (LEVOPHED) Adult infusion      Labs: Recent Labs    11/02/20 0820 11/03/20 0452  NA 136 140  K 4.4 4.6  CL 101 108  CO2 23 24  GLUCOSE 131* 123*  BUN 86* 99*  CREATININE 3.96* 3.73*  CALCIUM 8.3* 8.1*  MG 2.5* 2.4  PHOS 3.8 2.8   Recent Labs    11/02/20 0820 11/03/20 0452  AST 711* 263*  ALT 1,460* 854*  ALKPHOS 100 77  BILITOT 0.6 0.5  PROT 6.5 5.6*  ALBUMIN 2.6* 2.3*   Recent Labs    11/02/20 0820 11/03/20 0452  WBC 12.8* 10.8*  NEUTROABS 11.7* 9.4*  HGB 13.2 11.3*  HCT 39.0 33.6*  MCV 87.1 86.6  PLT 208 206   Recent Labs    11/01/20 0434 11/02/20 0820 11/03/20 0452  CKTOTAL 5,684* 2,866* 1,588*   Invalid input(s): POCBNP No results for input(s): HGBA1C in the last 72 hours.   Weights: Filed Weights   10/30/20 2212 11/01/20 0457  Weight: 72.8 kg 76.8 kg     Radiology/Studies:  CT Abdomen Pelvis Wo Contrast  Result Date: 10/31/2020 CLINICAL DATA:  Abdominal pain EXAM: CT ABDOMEN AND PELVIS WITHOUT CONTRAST TECHNIQUE: Multidetector CT imaging of the abdomen and pelvis was performed following the standard protocol without IV contrast. COMPARISON:  None. FINDINGS: Lower chest: Patchy opacities in the bilateral lower lungs, suspicious for pneumonia. Hepatobiliary: Liver is within normal limits. Layering noncalcified gallstones versus gallbladder sludge (series 2/image 28), without associated inflammatory changes. No intrahepatic or extrahepatic duct dilatation. Pancreas: Within normal limits. Spleen: Within normal limits. Adrenals/Urinary Tract: Adrenal glands are within normal limits. Multiple nonobstructing right lower pole renal calculi measuring up to 7 mm (series 2/image 34). Left kidney is within normal limits. No hydronephrosis. Mildly thick-walled bladder. Stomach/Bowel: Stomach is within normal limits. No evidence of bowel obstruction.  Normal appendix (series 2/image 56). Sigmoid diverticulosis, without evidence of diverticulitis. Vascular/Lymphatic: No evidence of abdominal aortic aneurysm. Atherosclerotic calcifications of the abdominal aorta and branch vessels. No suspicious abdominopelvic lymphadenopathy. Reproductive: Prostate is unremarkable. Other: No abdominopelvic ascites. Tiny fat containing periumbilical hernia (series 2/image 48). Mild fat in the bilateral inguinal canals (series 2/image 76). Musculoskeletal: Degenerative changes of the visualized thoracolumbar spine. IMPRESSION: No evidence of bowel obstruction.  Normal appendix. Sigmoid diverticulosis, without evidence of diverticulitis. Multiple nonobstructing right lower pole renal calculi measuring up to 7 mm. No hydronephrosis. Cholelithiasis, without associated inflammatory changes. Electronically Signed   By: Julian Hy M.D.   On: 10/31/2020 00:18   DG Abd 1 View  Result Date: 11/01/2020 CLINICAL DATA:  NG tube placement EXAM: ABDOMEN - 1 VIEW COMPARISON:  10/31/2020 FINDINGS: NG tube tip is in the stomach. Nonobstructive bowel gas pattern. No visible free air. Left base atelectasis or infiltrate. IMPRESSION: NG tube tip in the stomach. Electronically Signed   By: Rolm Baptise M.D.   On: 11/01/2020 16:45   DG Abd 1 View  Result Date: 10/31/2020 CLINICAL DATA:  Encounter for imaging study to confirm nasogastric (NG) tube placement Z01.89 (ICD-10-CM) EXAM: ABDOMEN - 1 VIEW COMPARISON:  CT 10/30/2020 FINDINGS: Limited radiograph of the lower chest and upper abdomen was obtained for the purposes of enteric tube localization.  Enteric tube is seen coursing below the diaphragm with distal tip and side port terminating within the expected location of the gastric body. Visualized bowel gas pattern is nonobstructive. Patchy bibasilar opacities. IMPRESSION: Enteric tube tip and side port terminating within the expected location of the gastric body. Electronically Signed    By: Davina Poke D.O.   On: 10/31/2020 14:18   CT HEAD WO CONTRAST (5MM)  Result Date: 10/30/2020 CLINICAL DATA:  Mental status change, unknown cause EXAM: CT HEAD WITHOUT CONTRAST TECHNIQUE: Contiguous axial images were obtained from the base of the skull through the vertex without intravenous contrast. COMPARISON:  None. FINDINGS: Brain: Low-density areas in the basal ganglia bilaterally compatible with acute infarcts, age indeterminate, favor chronic. No hemorrhage or hydrocephalus. Vascular: No hyperdense vessel or unexpected calcification. Skull: No acute calvarial abnormality. Sinuses/Orbits: No acute findings Other: None IMPRESSION: Bilateral basal ganglia lacunar infarcts, age indeterminate but favor chronic. Electronically Signed   By: Rolm Baptise M.D.   On: 10/30/2020 23:58   MR ANGIO HEAD WO CONTRAST  Result Date: 11/01/2020 CLINICAL DATA:  Stroke, follow-up EXAM: MRI HEAD WITHOUT CONTRAST MRA HEAD WITHOUT CONTRAST TECHNIQUE: Multiplanar, multi-echo pulse sequences of the brain and surrounding structures were acquired without intravenous contrast. Angiographic images of the Circle of Willis were acquired using MRA technique without intravenous contrast. COMPARISON:  No prior MRI, correlation is made with CT head 10/30/2020 FINDINGS: MRI HEAD FINDINGS Brain: Symmetric focal areas of hyperintense signal on diffusion-weighted imaging in the bilateral globi palladi (series 5, image 26), without ADC correlate (series 6, image 26), and associated with increased T2 signal (series 15, image 29), likely subacute infarcts. No acute hemorrhage or hemosiderin deposition to suggest remote hemorrhage. No hydrocephalus, mass, mass effect, or midline shift. Vascular: See MRI findings below. Skull and upper cervical spine: Normal marrow signal. Sinuses/Orbits: Mucosal thickening in the ethmoid air cells. Otherwise negative. Other: Fluid in the bilateral mastoid air cells. MRA HEAD FINDINGS Anterior  circulation: Both internal carotid arteries are patent to the termini, with irregularity in the left-greater-than-right cavernous portions, likely related to atherosclerotic disease, but without focal stenosis or occlusion. A1 segments patent. Normal anterior communicating artery. Anterior cerebral arteries are patent to their distal aspects. No M1 stenosis or occlusion. Normal MCA bifurcations. Evaluation of the distal MCA branches is somewhat limited by the degree of motion artifact on the slices, however no focal occlusion is seen. Posterior circulation: Vertebral arteries patent to the vertebrobasilar junction without stenosis. Basilar patent to its distal aspect. Superior cerebral arteries patent bilaterally. PCAs perfused to their distal aspects without focal stenosis. Diminutive left P1 with a patent left posterior communicating artery. Anatomic variants: None significant. IMPRESSION: 1. Focal, symmetric increased signal on diffusion-weighted imaging in the bilateral globi palladi, without ADC correlate, and associated with increased T2 signal. While these signal characteristics can be seen in the setting of subacute ischemia, the symmetry of these finding would be unusual for ischemia but can be seen in the setting of carbon monoxide poisoning, with other etiologies including mitochondrial or toxic encephalopathies. 2. Evaluation of the intracranial vasculature somewhat limited by motion artifact. Within this limitation, no large vessel occlusion or significant focal stenosis. These results were called by telephone at the time of interpretation on 11/01/2020 at 10:23 pm to provider Rufina Falco, who verbally acknowledged these results. Electronically Signed   By: Merilyn Baba M.D.   On: 11/01/2020 22:31   MR BRAIN WO CONTRAST  Result Date: 11/01/2020 CLINICAL DATA:  Stroke, follow-up EXAM:  MRI HEAD WITHOUT CONTRAST MRA HEAD WITHOUT CONTRAST TECHNIQUE: Multiplanar, multi-echo pulse sequences of the  brain and surrounding structures were acquired without intravenous contrast. Angiographic images of the Circle of Willis were acquired using MRA technique without intravenous contrast. COMPARISON:  No prior MRI, correlation is made with CT head 10/30/2020 FINDINGS: MRI HEAD FINDINGS Brain: Symmetric focal areas of hyperintense signal on diffusion-weighted imaging in the bilateral globi palladi (series 5, image 26), without ADC correlate (series 6, image 26), and associated with increased T2 signal (series 15, image 29), likely subacute infarcts. No acute hemorrhage or hemosiderin deposition to suggest remote hemorrhage. No hydrocephalus, mass, mass effect, or midline shift. Vascular: See MRI findings below. Skull and upper cervical spine: Normal marrow signal. Sinuses/Orbits: Mucosal thickening in the ethmoid air cells. Otherwise negative. Other: Fluid in the bilateral mastoid air cells. MRA HEAD FINDINGS Anterior circulation: Both internal carotid arteries are patent to the termini, with irregularity in the left-greater-than-right cavernous portions, likely related to atherosclerotic disease, but without focal stenosis or occlusion. A1 segments patent. Normal anterior communicating artery. Anterior cerebral arteries are patent to their distal aspects. No M1 stenosis or occlusion. Normal MCA bifurcations. Evaluation of the distal MCA branches is somewhat limited by the degree of motion artifact on the slices, however no focal occlusion is seen. Posterior circulation: Vertebral arteries patent to the vertebrobasilar junction without stenosis. Basilar patent to its distal aspect. Superior cerebral arteries patent bilaterally. PCAs perfused to their distal aspects without focal stenosis. Diminutive left P1 with a patent left posterior communicating artery. Anatomic variants: None significant. IMPRESSION: 1. Focal, symmetric increased signal on diffusion-weighted imaging in the bilateral globi palladi, without ADC  correlate, and associated with increased T2 signal. While these signal characteristics can be seen in the setting of subacute ischemia, the symmetry of these finding would be unusual for ischemia but can be seen in the setting of carbon monoxide poisoning, with other etiologies including mitochondrial or toxic encephalopathies. 2. Evaluation of the intracranial vasculature somewhat limited by motion artifact. Within this limitation, no large vessel occlusion or significant focal stenosis. These results were called by telephone at the time of interpretation on 11/01/2020 at 10:23 pm to provider Rufina Falco, who verbally acknowledged these results. Electronically Signed   By: Merilyn Baba M.D.   On: 11/01/2020 22:31   DG Chest Port 1 View  Result Date: 11/02/2020 CLINICAL DATA:  60 year old male with acute respiratory failure. EXAM: PORTABLE CHEST 1 VIEW COMPARISON:  Portable chest 10/30/2020. FINDINGS: Portable AP view at 1028 hours. Stable tracheostomy. Enteric tube now in place, courses to the stomach with tip not included. Mildly improved lung volumes. Regressed patchy and peripheral left lung opacity, only mild residual. Elsewhere lung markings are stable. No pneumothorax, pleural effusion or new pulmonary opacity. No acute osseous abnormality identified. Visible bowel-gas pattern within normal limits. IMPRESSION: 1. Stable tracheostomy. Enteric tube courses to the stomach with tip not included. 2. Substantially regressed left lung opacity with mild residual. 3. No new cardiopulmonary abnormality. Electronically Signed   By: Genevie Ann M.D.   On: 11/02/2020 11:39   DG Chest Portable 1 View  Result Date: 10/30/2020 CLINICAL DATA:  Hypoxia EXAM: PORTABLE CHEST 1 VIEW COMPARISON:  None. FINDINGS: Tracheostomy tube tip about 5.8 cm superior to carina. The right lung shows no focal airspace disease. The left lung shows mild diffuse ground-glass opacity. No pleural effusion. Normal cardiac size. No  pneumothorax. IMPRESSION: Diffuse hazy opacity in the left thorax which may be secondary to pneumonia  Electronically Signed   By: Donavan Foil M.D.   On: 10/30/2020 22:44   EEG adult  Result Date: 10/31/2020 Lora Havens, MD     10/31/2020  4:12 PM Patient Name: BRAXDEN LOVERING MRN: [id number] Epilepsy Attending: Lora Havens Referring Physician/Provider: Rust-Chester, Huel Cote, NP Date: 10/31/2020 Duration: 23.50 mins Patient history: 60 year old male with altered mental status.  EEG to evaluate for seizure. Level of alertness:  lethargic AEDs during EEG study: None Technical aspects: This EEG study was done with scalp electrodes positioned according to the 10-20 International system of electrode placement. Electrical activity was acquired at a sampling rate of 500Hz and reviewed with a high frequency filter of 70Hz and a low frequency filter of 1Hz. EEG data were recorded continuously and digitally stored. Description: No posterior dominant rhythm was seen. EEG showed continuous generalized 3 to 6 Hz theta-delta slowing. Hyperventilation and photic stimulation were not performed.   ABNORMALITY - Continuous slow, generalized IMPRESSION: This study is suggestive of moderate diffuse encephalopathy, nonspecific etiology. No seizures or epileptiform discharges were seen throughout the recording. Lora Havens   ECHOCARDIOGRAM COMPLETE BUBBLE STUDY  Result Date: 10/31/2020    ECHOCARDIOGRAM REPORT   Patient Name:   Ronald Murphy Date of Exam: 10/31/2020 Medical Rec #:  [id number]      Height:       63.0 in Accession #:    [id number]     Weight:       160.5 lb Date of Birth:  1960/08/03      BSA:          1.761 m Patient Age:    33 years       BP:           106/70 mmHg Patient Gender: M              HR:           89 bpm. Exam Location:  [facility] Procedure: 2D Echo, Cardiac Doppler, Color Doppler and Saline Contrast Bubble            Study Indications:     Stroke 434.91 / I63.9  History:         Patient  has no prior history of Echocardiogram examinations.                  Risk Factors:Hypertension.  Sonographer:     Sherrie Sport Referring Phys:  [id number] BRITTON L RUST-CHESTER Diagnosing Phys: Harrell Gave End MD  Sonographer Comments: Echo performed with patient supine and on artificial respirator and no parasternal window. IMPRESSIONS  1. Left ventricular ejection fraction, by estimation, is >55%. The left ventricle has normal function. Left ventricular endocardial border not optimally defined to evaluate regional wall motion. Left ventricular diastolic parameters are consistent with Grade I diastolic dysfunction (impaired relaxation). Elevated left atrial pressure.  2. Right ventricular systolic function is moderately reduced. The right ventricular size is normal. Moderately increased right ventricular wall thickness. Tricuspid regurgitation signal is inadequate for assessing PA pressure.  3. A small pericardial effusion is present.  4. The mitral valve is abnormal. No evidence of mitral valve regurgitation. No evidence of mitral stenosis. Moderate to severe mitral annular calcification.  5. The aortic valve has an indeterminant number of cusps. There is moderate calcification of the aortic valve. There is moderate thickening of the aortic valve. Aortic valve regurgitation is not visualized. There is at least mild aortic stenosis, though  evaluation is limited by suboptimal windows.  6. Agitated saline contrast bubble study was negative, with no evidence of any interatrial shunt. FINDINGS  Left Ventricle: Left ventricular ejection fraction, by estimation, is >55%. The left ventricle has normal function. Left ventricular endocardial border not optimally defined to evaluate regional wall motion. The left ventricular internal cavity size was  normal in size. There is no left ventricular hypertrophy. Left ventricular diastolic parameters are consistent with Grade I diastolic dysfunction (impaired relaxation). Elevated  left atrial pressure. Right Ventricle: The right ventricular size is normal. Moderately increased right ventricular wall thickness. Right ventricular systolic function is moderately reduced. Tricuspid regurgitation signal is inadequate for assessing PA pressure. Left Atrium: Left atrial size was normal in size. Right Atrium: Right atrial size was normal in size. Pericardium: A small pericardial effusion is present. Mitral Valve: The mitral valve is abnormal. Moderate to severe mitral annular calcification. No evidence of mitral valve regurgitation. No evidence of mitral valve stenosis. MV peak gradient, 3.9 mmHg. The mean mitral valve gradient is 2.0 mmHg. Tricuspid Valve: The tricuspid valve is not well visualized. Tricuspid valve regurgitation is trivial. Aortic Valve: The aortic valve has an indeterminant number of cusps. There is moderate calcification of the aortic valve. There is moderate thickening of the aortic valve. Aortic valve regurgitation is not visualized. There is at least mild aortic stenosis, though evaluation is limited by suboptimal windows. Aortic valve mean gradient measures 10.0 mmHg. Aortic valve peak gradient measures 12.4 mmHg. Aortic valve area, by VTI measures 1.59 cm. Pulmonic Valve: The pulmonic valve was not well visualized. Pulmonic valve regurgitation is not visualized. No evidence of pulmonic stenosis. Aorta: The aortic root was not well visualized. Pulmonary Artery: The pulmonary artery is of normal size. Venous: IVC assessment for right atrial pressure unable to be performed due to mechanical ventilation. IAS/Shunts: The interatrial septum was not well visualized. Agitated saline contrast was given intravenously to evaluate for intracardiac shunting. Agitated saline contrast bubble study was negative, with no evidence of any interatrial shunt.  LEFT VENTRICLE PLAX 2D LVIDd:         3.40 cm   Diastology LVIDs:         2.00 cm   LV e' medial:    3.59 cm/s LV PW:         0.90 cm    LV E/e' medial:  20.1 LV IVS:        0.90 cm   LV e' lateral:   6.20 cm/s LVOT diam:     2.00 cm   LV E/e' lateral: 11.6 LV SV:         41 LV SV Index:   23 LVOT Area:     3.14 cm  RIGHT VENTRICLE RV S prime:     14.10 cm/s TAPSE (M-mode): 2.8 cm LEFT ATRIUM           Index        RIGHT ATRIUM          Index LA diam:      3.10 cm 1.76 cm/m   RA Area:     6.72 cm LA Vol (A4C): 19.9 ml 11.30 ml/m  RA Volume:   12.20 ml 6.93 ml/m  AORTIC VALVE                     PULMONIC VALVE AV Area (Vmax):    0.99 cm      PV Vmax:        0.83 m/s AV Area (Vmean):   1.11  cm      PV Peak grad:   2.7 mmHg AV Area (VTI):     1.59 cm      RVOT Peak grad: 2 mmHg AV Vmax:           176.00 cm/s AV Vmean:          116.500 cm/s AV VTI:            0.257 m AV Peak Grad:      12.4 mmHg AV Mean Grad:      10.0 mmHg LVOT Vmax:         55.50 cm/s LVOT Vmean:        41.000 cm/s LVOT VTI:          0.130 m LVOT/AV VTI ratio: 0.51 MITRAL VALVE MV Area (PHT): 2.57 cm    SHUNTS MV Area VTI:   1.63 cm    Systemic VTI:  0.13 m MV Peak grad:  3.9 mmHg    Systemic Diam: 2.00 cm MV Mean grad:  2.0 mmHg MV Vmax:       0.99 m/s MV Vmean:      57.2 cm/s MV Decel Time: 295 msec MV E velocity: 72.00 cm/s MV A velocity: 84.00 cm/s MV E/A ratio:  0.86 Christopher End MD Electronically signed by [name] MD Signature Date/Time: 10/31/2020/5:39:22 PM    Final      Assessment and Recommendation  60 y.o. male critically ill with a past medical history of laryngeal cancer status post tracheostomy tube placement presenting with pneumonia with severe hypoxic respiratory failure, elevated troponin secondary to NSTEMI complicated by multiorgan failure now slowly improving.  Echocardiogram showed preserved ejection fraction and left ventricular systolic function.  Plan: -Continue heparin drip for total of 48 hours for management of suspected NSTEMI, then continue with single antiplatelet therapy after.  Would consider the possibility of dual  antiplatelet therapy if patient would tolerate well for his non-ST elevation myocardial infarction -Plan to add beta-blocker for further management of NSTEMI with preserved ejection fraction when the patient is no longer in need of pressure support and/or Levophed for further treatment of myocardial infarction -Avoid ACE inhibitor's at this time due to acute kidney injury, will consider addition of this as an outpatient as needed -Continue conservative management of NSTEMI at this time due to concurrent multiorgan failure and current illness. -Continue current care and management for severe septic shock with a community-acquired pneumonia, hypoxic respiratory failure, metabolic encephalopathy, multiorgan failure. -No further cardiac diagnostics necessary at this time -Begin ambulation and rehabilitation as able -Please call with any further questions, Dr. Corky Sox will be on call this weekend  Signed, Jettie Booze, PA-C

## 2020-11-03 NOTE — TOC Initial Note (Signed)
Transition of Care [facility]) - Initial/Assessment Note    Patient Details  Name: Ronald Murphy MRN: [id number] Date of Birth: Dec 21, 1960  Transition of Care [facility]) CM/SW Contact:    Kerin Salen, RN Phone Number: 11/03/2020, 2:31 PM  Clinical Narrative:  Spoke with patient's Son who lives in the home with patient and patients wife. Son says patient was independent with ADL's, driving to medical appointments. PCP is with the [facility], uses the [employer]. Son says he does most of the cooking and shopping. Patient does not use assisted devices nor other DME. Wife at bedside says patient was doing good until he became ill, and will discharge back to home. TOC to continue to track and assist as needed.                    Patient Goals and CMS Choice        Expected Discharge Plan and Services                                                Prior Living Arrangements/Services                       Activities of Daily Living      Permission Sought/Granted                  Emotional Assessment              Admission diagnosis:  Hyperkalemia [E87.5] Hyponatremia [E87.1] Altered mental status [R41.82] Elevated troponin [R77.8] NSTEMI (non-ST elevated myocardial infarction) (HCC) [I21.4] Calculus of gallbladder without cholecystitis without obstruction [K80.20] Acute renal failure, unspecified acute renal failure type ([place]) [N17.9] Altered mental status, unspecified altered mental status type [R41.82] Patient Active Problem List   Diagnosis Date Noted   Protein-calorie malnutrition, severe 11/01/2020   Tracheostomy status ([place]) 89/38/1017   Acute metabolic encephalopathy 51/02/5850   History of laryngeal cancer 10/31/2020   AKI (acute kidney injury) ([place]) 10/31/2020   NSTEMI (non-ST elevated myocardial infarction) ([place]) 10/31/2020   Small cell carcinoma of lung ([place]) 10/31/2020   Radiation-induced esophageal stricture  10/31/2020   Chronic, continuous use of opioids 10/31/2020   Chronic hyponatremia 10/31/2020   Acute hepatitis 10/31/2020   Acute respiratory failure with hypoxia ([place]) 10/31/2020   Altered mental status 10/31/2020   Pressure injury of skin 10/31/2020   Larynx cancer ([place]) 09/02/2017   Chronic hepatitis C without hepatic coma ([place]) 09/02/2017   PCP:  Pcp, No Pharmacy:  No Pharmacies Listed    Social Determinants of Health (SDOH) Interventions    Readmission Risk Interventions Readmission Risk Prevention Plan 11/03/2020  Transportation Screening Complete  Medication Review [employer]) Not Complete  Med Review Comments Spoke with Son who had to hang up to take care of personal business.  PCP or Specialist appointment within 3-5 days of discharge Not Complete  PCP/Specialist Appt Not Complete comments NMS for discharge  [place] or [place] Complete  SW Recovery Care/Counseling Consult Complete  Palliative Care Screening Not Applicable  Skilled Nursing Facility Complete  Some recent data might be hidden

## 2020-11-03 NOTE — Progress Notes (Addendum)
NAME:  Ronald Murphy, MRN:  [id number], DOB:  [birth year], LOS: 3 ADMISSION DATE:  10/30/2020, CONSULTATION DATE:  10/31/2020 REFERRING MD:  Dr. Beather Arbour, CHIEF COMPLAINT:  Altered Mental Status   11/03/2020 APP Dewaine Conger. I saw and evaluated the patient. Discussed with APP and agree with their findings and plan as documented below.  I have seen and evaluated the patient for respiratory failure  S:  Tolerating TCT during the day Wife at bedside Sputum culture with MSSA. Changed to cefazolin  O: Blood pressure 121/76, pulse 80, temperature 97.8 F (36.6 C), temperature source Oral, resp. rate 12, height 5\' 3"  (1.6 m), weight 169 lb 5 oz (76.8 kg), SpO2 95 %.   Exam: Gen:       No acute distress on trach collar HEENT:  EOMI, tracking, sclera anicteric Neck:      No masses, JVD Lungs:    Clear to auscultation bilaterally; normal respiratory effort. Breathing is nonlabored CV:         Regular rate and rhythm; no murmurs. No edema Abd:      + bowel sounds; soft, non-tender; no palpable masses, no distension Ext:    No edema; adequate peripheral perfusion Skin:       Warm and dry; no rash Neuro:    follows commands in all extremities Psych:    normal mood and affect    A/P:   #NSTEMI --cards following, planning for conservative medical management. Completed 48 hrs of heparin gtt. Start ASA/statin  #Acute Hypoxic and Hypercapnic Respiratory Failure --secondary HY:QMVH pneumonia in this patient with a chronic trach 2/2 laryngeal cancer (not ventilated at home) --Now toelrating trach collar during the day with MV support at night --Therapeutic plan: Transition abx to cefazolin with plan to complete a 7 day course Will wean steroids for presumed obstructive lung disease component over coming days  #Septic Shock 2/2 MSSA PNA (resolved) --HD stable. Abx plan as above  #AKI 2/2 rhabdomyolysis --CK downtrending. Probable ATN component at this time. Continue to monitor and provide  supportive care  #Shock liver --LFTs downtrending. monitor  All other plans as detailed in the APP's note   Patient critically ill due to Acute hypoxic/hypercapnic respiratory failure Interventions to address this today weaning of mechanical ventilation Risk of deterioration without these interventions is high  I personally spent 35 minutes providing critical care not including any separately billable procedures   Ronald Murphy Ronald Murphy Pulmonary/Critical Care   Brief Synopsis:  Pneumonia with severe hypoxic resp failure with septic shock due to MSSA Pneumonia, acute ischemic cardiomyopathy with NSTEMI,  acute renal failure and liver failure and acidosis.  History of Present Illness:  60 year old male with PMHx of laryngeal cancer status post tracheostomy presented to [facility] ED from home via EMS on 10/30/2020 due to his son's concern of altered mental status.  Per his son's report he last saw his father well around 1 PM on Monday, 10/30/2020.  His father lives with his wife, for whom he is caretaker, as she has dementia and is very hard of hearing.  When his son returned from work after 8 PM on Monday, 10/30/2020 his father appeared altered leaning to his right side, unable to follow commands and unable to speak to him. Per his son his father is ambulatory, alert and oriented x4 and able to care for himself.  Per his son's report patient does have a history of alcoholism but currently only drinks nonalcoholic beer.  He is not aware of any previous stroke  history. ED course: Per ED documentation on arrival patient was sluggish and would occasionally follow commands but not answering any questions.  The patient will look at you when you talk to him, but is not verbal. Medications given: 10 units of insulin and D50 given for hyperkalemia.  Heparin infusion started for NSTEMI Initial Vitals: T 99.1, RR 17, HR 93, BP 116/61 and SPO2 100% on trach collar at 10 L Significant labs: (Labs/ Imaging  personally reviewed) I, Domingo Pulse [place], AGACNP-BC, personally viewed and interpreted this ECG. EKG Interpretation: Date: 10/31/2019, EKG Time: 22:11, Rate: 102, Rhythm: Sinus tachycardia, QRS Axis: Normal, Intervals: Normal, ST/T Wave abnormalities: None, Narrative Interpretation: Sinus tachycardia Chemistry: Na+: 129, K+: 6.3, BUN/Cr.:  34/3.64, Serum CO2/ AG: 26/13 Severe Transaminitis: AST 7291 ALT 2441 ammonia 51 total bilirubin 1.4 Hematology: WBC: 9.9, Hgb: 13.7,  Troponin: 17,932, Lactic/ PCT: 2.8/1.73, COVID-19 & Influenza A/B: Negative ABG: 7.31/51/169/25.7 CXR 10/30/2020: Diffuse hazy opacity in the left thorax which may be secondary to pneumonia CT head 10/30/2020: Bilateral basal ganglia lacunar infarcts age-indeterminate but favored chronic CT abdomen pelvis without contrast 10/30/2020: No evidence of bowel obstruction.  Sigmoid diverticulosis without evidence of diverticulitis.  Multiple nonobstructing right lower pole renal calculi measuring up to 7 mm, no hydronephrosis.  Cholelithiasis without cholecystitis.  PCCM consulted for admission due to complexity of patient with chronic tracheostomy requiring mechanical ventilatory support for respiratory acidosis and hypercapnia.  Pertinent  Medical History  Laryngeal cancer status post chronic tracheostomy Anxiety and depression Hepatitis C Hypertension Alcoholism in remission Kidney stones  Significant Hospital Events: Including procedures, antibiotic start and stop dates in addition to other pertinent events   10/31/20: Patient admitted to ICU with chronic tracheostomy requiring mechanical ventilatory support.  Complex patient with severe Transaminitis, possible sepsis, NSTEMI, acute kidney injury with severe hyperkalemia, and acute altered mental status. 10/19 remains on vent, weaned off pressors 10/20 remains on vent, low BP, ms changes, follow up NEURO RECS 10/21: Tolerating TC trials during day, vent at night;  Tracheal aspirate with Staph aureus (MSSA), ABX changed to Cefazolin  Interim History / Subjective:  -No acute events overnight -Afebrile, hemodynamically stable, no vasopressors -Tracheal Aspirate with Staph Aureus (MSSA) ~ change ABX to Cefazolin -Tolerating TC during day, vent at night -Initially lethargic upon exam ~ ABG normal ~ more alert later in the morning -Leukocytosis, renal function, Rhabdomyolysis,  and LFT's slowly improving  Objective   Blood pressure (!) 142/92, pulse 95, temperature 98 F (36.7 C), temperature source Oral, resp. rate 15, height 5\' 3"  (1.6 m), weight 76.8 kg, SpO2 95 %.    Vent Mode: PRVC FiO2 (%):  [28 %] 28 % Set Rate:  [20 bmp] 20 bmp Vt Set:  [450 mL] 450 mL PEEP:  [5 cmH20] 5 cmH20   Intake/Output Summary (Last 24 hours) at 11/03/2020 1357 Last data filed at 11/03/2020 1100 Gross per 24 hour  Intake 1240.33 ml  Output 1940 ml  Net -699.67 ml   Filed Weights   10/30/20 2212 11/01/20 0457  Weight: 72.8 kg 76.8 kg    Examination: General: Acute on chronically ill appearing male, laying in bed, sleeping, in NAD HEENT: MM pink/moist, anicteric, atraumatic, neck supple, tracheostomy in place Neuro: Lethargic, arouses to voice, nonverbal, follows intermittent simple commands, PERRL +3, MAE-generalized weakness CV: s1s2 RRR, NSR on monitor, no r/m/g Pulm: Regular, non labored on trach collar, breath sounds diminished throughout GI: soft, rounded, non tender, bs x 4 GU: foley in place with clear yellow  urine Skin: Burn noted on left ear, DTI on left hip/right knee/multiple areas on feet   Extremities: warm/dry, pulses + 2 R/P, no edema noted  Resolved Hospital Problem list     Assessment & Plan:   N-STEMI PMHx: Hypertension   -Continuous cardiac monitoring -Maintain MAP >65 -HS Troponin peaked at 77,824 -Echocardiogram with LVEF >55%, moderately reduced RV systolic function -Cardiology following, appreciate input ~ plan for  conservative management at this time due to critical illness and multiorgan failure -Completed 48 hrs of Heparin per ACS protocol -Add BB once consistently off vasopressors  Suspected sepsis with septic shock due to CAP (MSSA Pneumonia) Lactic: 2.8> 1.7, Baseline PCT: 1.73, UA: Negative, CXR: Diffuse hazy opacity in the left thorax possible for pneumonia, CT abdomen pelvis: Cholelithiasis without cholecystitis, renal calculi without hydronephrosis Initial interventions/workup included: 1 L of NS & ceftriaxone and azithromycin -Monitor fever curve -Trend WBC's & Procalcitonin -Follow cultures as above -ABX changed to Cefazolin 11/03/20  Acute on chronic hepatitis C in the setting of NSTEMI & rhabdomyolysis PMHx: Hepatitis C, alcoholism in remission AST 7291, ALT 2441, ammonia 51, total bilirubin 1.4 (liver enzymes normal August 2022).  CT abdomen pelvis: Cholelithiasis without cholecystitis - Trend hepatic function, follow-up hepatitis panel - avoid hepatotoxic agents - consult GI as needed  Acute Hypoxic / Hypercapnic Respiratory Failure secondary to  CAP (Methicillin sensitive staph aureus) in the setting of acute metabolic encephalopathy  Chronic tracheostomy -Full vent support as needed, implement lung protective strategies -Vent at night / Trach collar trials as tolerated during the day -Plateau pressures less than 30 cm H20 -Wean FiO2 & PEEP as tolerated to maintain O2 sats >92% -Follow intermittent Chest X-ray & ABG as needed -Implement VAP Bundle -Bronchodilators & Budesonide nebs -Continue Solumedrol 40 mg daily for now due to wheezing -ABX changed to Cefazolin 10/21  Rhabdomyolysis Acute Kidney Injury  Baseline Cr: 0.7, Cr on admission: 3.64 -Monitor I&O's / urinary output -Follow BMP -Ensure adequate renal perfusion -Avoid nephrotoxic agents as able -Replace electrolytes as indicated -Trend CK -Low threshold for Nephrology consult  Acute Metabolic  encephalopathy Bilateral basal ganglia lacunar infarct-new since May 2022 Infarct suspected chronic in nature, due to son's story of acute change in mental status and difficulty with balance.  Stat telemetry neuro consult obtained to look over imaging and receive recommendations as patient is receiving high-dose heparin for N-STEMI.  Per telemetry neurologist infarcts appear chronic on imaging, follow-up EEG recommended. -Frequent neuro checks -Provide supportive care -Avoid sedating meds as able -MRI Brain / MRA Head 10/19: IMPRESSION: 1. Focal, symmetric increased signal on diffusion-weighted imaging in the bilateral globi palladi, without ADC correlate, and associated with increased T2 signal. While these signal characteristics can be seen in the setting of subacute ischemia, the symmetry of these finding would be unusual for ischemia but can be seen in the setting of carbon monoxide poisoning, with other etiologies including mitochondrial or toxic encephalopathies. 2. Evaluation of the intracranial vasculature somewhat limited by motion artifact. Within this limitation, no large vessel occlusion or significant focal stenosis. -Neurology following, appreciate input  Pressure injuries on hip/knee/feet Burn on left ear -Supportive care -Turn every 2 hours, range of motion every 4 hours -Wound care consult  Best Practice (right click and "Reselect all SmartList Selections" daily)  Diet/type: NPO w/ meds via tube, tube feeds DVT prophylaxis: Heparin SQ GI prophylaxis: PPI Lines: N/A Foley:  Yes, and it is still needed Code Status:  full code Last date of multidisciplinary goals  of care discussion [11/03/20]  Labs   CBC: Recent Labs  Lab 10/30/20 2211 10/31/20 0421 11/01/20 0434 11/02/20 0820 11/03/20 0452  WBC 9.9 9.8 7.0 12.8* 10.8*  NEUTROABS  --   --  6.5 11.7* 9.4*  HGB 13.7 11.3* 11.4* 13.2 11.3*  HCT 39.6 33.0* 33.1* 39.0 33.6*  MCV 88.8 88.5 86.4 87.1 86.6  PLT  165 139* 154 208 206     Basic Metabolic Panel: Recent Labs  Lab 10/30/20 2211 10/31/20 0151 10/31/20 0421 11/01/20 0434 11/02/20 0820 11/03/20 0452  NA 129*  --  130* 132* 136 140  K 6.3* 5.1 5.0 4.5 4.4 4.6  CL 90*  --  98 102 101 108  CO2 26  --  24 23 23 24   GLUCOSE 106*  --  103* 132* 131* 123*  BUN 34*  --  38* 58* 86* 99*  CREATININE 3.64*  --  3.16* 3.81* 3.96* 3.73*  CALCIUM 8.5*  --  7.5* 7.8* 8.3* 8.1*  MG  --   --  2.2 2.3 2.5* 2.4  PHOS  --   --  5.5* 4.0 3.8 2.8    GFR: Estimated Creatinine Clearance: 19.1 mL/min (A) (by C-G formula based on SCr of 3.73 mg/dL (H)). Recent Labs  Lab 10/30/20 2211 10/31/20 0152 10/31/20 0421 11/01/20 0434 11/02/20 0820 11/03/20 0452  PROCALCITON 1.73  --   --   --   --   --   WBC 9.9  --  9.8 7.0 12.8* 10.8*  LATICACIDVEN 2.8* 1.7  --   --   --   --      Liver Function Tests: Recent Labs  Lab 10/30/20 2211 10/31/20 0421 11/01/20 0434 11/02/20 0820 11/03/20 0452  AST 7,291* 4,293* 1,490* 711* 263*  ALT 2,441* 2,410* 1,790* 1,460* 854*  ALKPHOS 91 74 77 100 77  BILITOT 1.4* 0.9 0.6 0.6 0.5  PROT 8.0 6.1* 5.8* 6.5 5.6*  ALBUMIN 2.9* 2.5* 2.3* 2.6* 2.3*    No results for input(s): LIPASE, AMYLASE in the last 168 hours. Recent Labs  Lab 10/31/20 0006 11/01/20 0434  AMMONIA 51* 34     ABG    Component Value Date/Time   PHART 7.42 11/03/2020 0846   PCO2ART 36 11/03/2020 0846   PO2ART 75 (L) 11/03/2020 0846   HCO3 23.4 11/03/2020 0846   ACIDBASEDEF 0.8 11/03/2020 0846   O2SAT 95.2 11/03/2020 0846      Coagulation Profile: Recent Labs  Lab 10/30/20 2211  INR 1.9*     Cardiac Enzymes: Recent Labs  Lab 10/30/20 2211 10/31/20 0421 11/01/20 0434 11/02/20 0820 11/03/20 0452  CKTOTAL 16,180* 12,478* 5,684* 2,866* 1,588*     HbA1C: Hgb A1c MFr Bld  Date/Time Value Ref Range Status  10/31/2020 08:59 AM 5.5 4.8 - 5.6 % Final    Comment:    (NOTE)         Prediabetes: 5.7 - 6.4          Diabetes: >6.4         Glycemic control for adults with diabetes: <7.0     CBG: Recent Labs  Lab 11/02/20 1919 11/02/20 2344 11/03/20 0345 11/03/20 0741 11/03/20 1123  GLUCAP 134* 110* 122* 156* 138*     Review of Systems:   UTA- patient altered and unable to participate in interview  Past Medical History:  He,  has a past medical history of Alcoholism in remission ([place]), Anxiety, Depression, Hepatitis C, Hypertension, Kidney stone, and Larynx cancer ([place]) (04/05/2017).  Surgical History:   Past Surgical History:  Procedure Laterality Date   SPINE SURGERY  1991     Social History:   reports that he quit smoking about 7 years ago. He has never used smokeless tobacco. He reports that he does not currently use alcohol. He reports that he does not currently use drugs after having used the following drugs: Marijuana.   Family History:  His family history is not on file. He was adopted.   Allergies No Known Allergies   Home Medications  Prior to Admission medications   Medication Sig Start Date End Date Taking? Authorizing Provider  clonazePAM (KLONOPIN) 0.5 MG tablet Take 0.5 mg by mouth 2 (two) times daily as needed for anxiety.    [provider]  escitalopram (LEXAPRO) 20 MG tablet Take 20 mg by mouth daily.    [provider]  gabapentin (NEURONTIN) 300 MG capsule Take 300 mg by mouth at bedtime.    [provider]  ondansetron (ZOFRAN) 8 MG tablet Take by mouth every 8 (eight) hours as needed for nausea or vomiting.    [provider]  oxycodone (OXY-IR) 5 MG capsule Take 5 mg by mouth every 4 (four) hours as needed.    [provider]  prochlorperazine (COMPAZINE) 10 MG tablet Take 10 mg by mouth every 6 (six) hours as needed for nausea or vomiting.    [provider]  tamsulosin (FLOMAX) 0.4 MG CAPS capsule Take 0.4 mg by mouth daily.    [provider]     Critical care time: 40 minutes     Darel Hong, AGACNP-BC [facility] epic messenger for cross cover needs If after hours, please call E-link

## 2020-11-04 LAB — BLOOD GAS, VENOUS
Acid-Base Excess: 1.9 mmol/L (ref 0.0–2.0)
Bicarbonate: 25.6 mmol/L (ref 20.0–28.0)
FIO2: 0.28
O2 Saturation: 95.5 %
Patient temperature: 37
pCO2, Ven: 36 mmHg — ABNORMAL LOW (ref 44.0–60.0)
pH, Ven: 7.46 — ABNORMAL HIGH (ref 7.250–7.430)
pO2, Ven: 74 mmHg — ABNORMAL HIGH (ref 32.0–45.0)

## 2020-11-04 LAB — CBC WITH DIFFERENTIAL/PLATELET
Abs Immature Granulocytes: 0.58 10*3/uL — ABNORMAL HIGH (ref 0.00–0.07)
Basophils Absolute: 0 10*3/uL (ref 0.0–0.1)
Basophils Relative: 0 %
Eosinophils Absolute: 0 10*3/uL (ref 0.0–0.5)
Eosinophils Relative: 0 %
HCT: 33.1 % — ABNORMAL LOW (ref 39.0–52.0)
Hemoglobin: 11.4 g/dL — ABNORMAL LOW (ref 13.0–17.0)
Immature Granulocytes: 5 %
Lymphocytes Relative: 3 %
Lymphs Abs: 0.4 10*3/uL — ABNORMAL LOW (ref 0.7–4.0)
MCH: 30.1 pg (ref 26.0–34.0)
MCHC: 34.4 g/dL (ref 30.0–36.0)
MCV: 87.3 fL (ref 80.0–100.0)
Monocytes Absolute: 0.9 10*3/uL (ref 0.1–1.0)
Monocytes Relative: 8 %
Neutro Abs: 9.9 10*3/uL — ABNORMAL HIGH (ref 1.7–7.7)
Neutrophils Relative %: 84 %
Platelets: 227 10*3/uL (ref 150–400)
RBC: 3.79 MIL/uL — ABNORMAL LOW (ref 4.22–5.81)
RDW: 14.8 % (ref 11.5–15.5)
WBC: 11.8 10*3/uL — ABNORMAL HIGH (ref 4.0–10.5)
nRBC: 0.2 % (ref 0.0–0.2)

## 2020-11-04 LAB — COMPREHENSIVE METABOLIC PANEL
ALT: 533 U/L — ABNORMAL HIGH (ref 0–44)
AST: 113 U/L — ABNORMAL HIGH (ref 15–41)
Albumin: 2.3 g/dL — ABNORMAL LOW (ref 3.5–5.0)
Alkaline Phosphatase: 75 U/L (ref 38–126)
Anion gap: 5 (ref 5–15)
BUN: 86 mg/dL — ABNORMAL HIGH (ref 8–23)
CO2: 25 mmol/L (ref 22–32)
Calcium: 8.2 mg/dL — ABNORMAL LOW (ref 8.9–10.3)
Chloride: 112 mmol/L — ABNORMAL HIGH (ref 98–111)
Creatinine, Ser: 2.98 mg/dL — ABNORMAL HIGH (ref 0.61–1.24)
GFR, Estimated: 23 mL/min — ABNORMAL LOW (ref >60)
Glucose, Bld: 121 mg/dL — ABNORMAL HIGH (ref 70–99)
Potassium: 4.3 mmol/L (ref 3.5–5.1)
Sodium: 142 mmol/L (ref 135–145)
Total Bilirubin: 0.7 mg/dL (ref 0.3–1.2)
Total Protein: 6 g/dL — ABNORMAL LOW (ref 6.5–8.1)

## 2020-11-04 LAB — GLUCOSE, CAPILLARY
Glucose-Capillary: 122 mg/dL — ABNORMAL HIGH (ref 70–99)
Glucose-Capillary: 132 mg/dL — ABNORMAL HIGH (ref 70–99)
Glucose-Capillary: 100 mg/dL — ABNORMAL HIGH (ref 70–99)
Glucose-Capillary: 114 mg/dL — ABNORMAL HIGH (ref 70–99)
Glucose-Capillary: 108 mg/dL — ABNORMAL HIGH (ref 70–99)

## 2020-11-04 LAB — VITAMIN B1: Vitamin B1 (Thiamine): 167.5 nmol/L (ref 66.5–200.0)

## 2020-11-04 LAB — CK: Total CK: 703 U/L — ABNORMAL HIGH (ref 49–397)

## 2020-11-04 LAB — MAGNESIUM: Magnesium: 2.2 mg/dL (ref 1.7–2.4)

## 2020-11-04 LAB — PHOSPHORUS: Phosphorus: 2.5 mg/dL (ref 2.5–4.6)

## 2020-11-04 MED ORDER — ATORVASTATIN CALCIUM 20 MG PO TABS: 40.0000 mg | ORAL_TABLET | Freq: Every day | ORAL | Status: DC

## 2020-11-04 NOTE — Progress Notes (Signed)
Inpatient Rehab Admissions Coordinator Note:   Per PT/OT patient was screened for CIR candidacy by Jenise Iannelli Danford Bad, CCC-SLP. At this time, pt appears to be a potential candidate for CIR.  Please place an IP Rehab MD consult order if pt would like to be considered.  Please contact me any with questions.Gayland Curry, [place], [place] Admissions Coordinator [phone] 11/04/20 5:03 PM

## 2020-11-04 NOTE — Evaluation (Signed)
Occupational Therapy Evaluation Patient Details Name: Ronald Murphy MRN: [id number] DOB: [birth year] Today's Date: 11/04/2020   History of Present Illness Pt is a 60 y/o M who presented to the ED from home on 10/30/20 due to his son's concern of AMS. Pt was admitted to the ICU on 10/18 & treated for severe transaminitis, possible sepsis, NTSEMI, AKI with severe hyperkalemia, and acute AMS. Pt found to have Tracheal aspirate with Staph aureus. PMH: laryngeal CA s/p tracheostomy, Hep C, alcoholism in remission, anxiety, depression, HTN   Clinical Impression   Chart reviewed, RN cleared pt for participation in OT evaluation. Pt is +trach 5L 02, +IV, +NG + rectal tube +cath; Co-evaluation with PT due to pt medical status. Pt BP at rest was 173/106, cleared by MD for mobility. Pt is alert and appears to be oriented to place, not oriented to time, or situation. Pt presents with flat affect throughout evaluation and frequent vcs for sustained attention to task. Pt endorses via head nod and mouthing that he assists wife at home, was MOD I-independent in ADL, home assistance and set up requires further confirmation. Pt requires MAX A x2 for bed mobility, STS 3x. TOTAL A for peri care, MOD A for grooming (washing face) at edge of bed. BUE AROM, strength, FMC/dexterity with impairments.  Pt tolerated mobility with SPO2 remaining between 93-96% at edge of bed. Pt returned to supine, all needs met. All lines/tubes intact. RN aware of pt status. Pt presents with functional deficits in strength, endurance, FMC/dexterity,functional nobility, cognition all affecting independent and safe ADL completion. Pt is performing below PLOF.  OT recommends discharge to CIR to address functional deficits and to improve safe completion of ADL/IADL following discharge.      Recommendations for follow up therapy are one component of a multi-disciplinary discharge planning process, led by the attending physician.  Recommendations may  be updated based on patient status, additional functional criteria and insurance authorization.   Follow Up Recommendations  CIR    Equipment Recommendations   (per next venue of care)    Recommendations for Other Services       Precautions / Restrictions Precautions Precautions: Fall Precaution Comments: catheter, rectal tube, NG tube, chronic trach on 5L o2 Restrictions Weight Bearing Restrictions: No      Mobility Bed Mobility Overal bed mobility: Needs Assistance Bed Mobility: Supine to Sit;Sit to Supine     Supine to sit: Max assist;+2 for physical assistance Sit to supine: Max assist;+2 for physical assistance   General bed mobility comments: initiation of BLE towards edge of bed, MAX A for supine<>seated Patient Response: Flat affect  Transfers Overall transfer level: Needs assistance Equipment used: 2 person hand held assist Transfers: Sit to/from Stand Sit to Stand: Max assist;+2 physical assistance;From elevated surface         General transfer comment: vcs required for posture, blocking B knees    Balance Overall balance assessment: Needs assistance Sitting-balance support: Feet supported;Single extremity supported Sitting balance-Leahy Scale: Poor Sitting balance - Comments: R lateral lean requiring MOD A for correction;   Standing balance support: Bilateral upper extremity supported;During functional activity Standing balance-Leahy Scale: Zero                             ADL either performed or assessed with clinical judgement   ADL Overall ADL's : Needs assistance/impaired Eating/Feeding: NPO Eating/Feeding Details (indicate cue type and reason): NG tube Grooming: Wash/dry face;Sitting;Moderate assistance  Toileting- Clothing Manipulation and Hygiene: +2 for safety/equipment;+2 for physical assistance;Maximal assistance Toileting - Clothing Manipulation Details (indicate cue type and reason): MAX A  for peri care x2 for standing at edge of bed     Functional mobility during ADLs: Maximal assistance General ADL Comments: max A x2 for standing 3x, sustained standing for no more than 30 seconds     Vision   Additional Comments: will continue to assess     Perception     Praxis      Pertinent Vitals/Pain Pain Assessment: Faces Faces Pain Scale: No hurt     Hand Dominance     Extremity/Trunk Assessment Upper Extremity Assessment Upper Extremity Assessment: Generalized weakness;RUE deficits/detail;LUE deficits/detail RUE Deficits / Details: AROM shoulder flexion to approx 90 degrees, elbow flexion/extension through 3/4 full AROM, weak grip strength; will continue to assess LUE Deficits / Details: AROM shoulder flexion to approx 90 degrees, elbow flexion/extension through 3/4 full AROM, weak grip strength; will continue to assess   Lower Extremity Assessment Lower Extremity Assessment: Generalized weakness   Cervical / Trunk Assessment Cervical / Trunk Assessment: Kyphotic   Communication Communication Communication: Tracheostomy   Cognition Arousal/Alertness: Awake/alert Behavior During Therapy: Flat affect Overall Cognitive Status: Impaired/Different from baseline Area of Impairment: Orientation;Memory;Following commands;Safety/judgement;Awareness;Problem solving;Attention                 Orientation Level: Disoriented to;Place;Time;Situation Current Attention Level: Focused (frequent vcs required for sustained attention to task) Memory: Decreased short-term memory;Decreased recall of precautions Following Commands: Follows one step commands with increased time Safety/Judgement: Decreased awareness of safety;Decreased awareness of deficits Awareness: Intellectual Problem Solving: Slow processing;Requires verbal cues;Requires tactile cues     General Comments  Pt required total assist for peri hygiene 2/2 BM leaking around rectal tubing. Pt tolerated tx well  although did have elevated BP (but MD cleared him).    Exercises     Shoulder Instructions      Home Living Family/patient expects to be discharged to:: Private residence Living Arrangements: Spouse/significant other Available Help at Discharge:  (conflicting information provided from wife at beside re: assistance at home. Pt endorses he helps care for wife.)                             Additional Comments: Pt unable to provide home set up information, wife present but per chart is [facility] & has hx of dementia- conflicting information provided      Prior Functioning/Environment          Comments: per chart and pt report pt was MOD I-I in ADL/IADL        OT Problem List: Decreased strength;Impaired balance (sitting and/or standing);Decreased cognition;Decreased safety awareness;Decreased activity tolerance;Decreased coordination;Impaired UE functional use      OT Treatment/Interventions: Self-care/ADL training;Therapeutic exercise;Patient/family education;Energy conservation;Balance training;Therapeutic activities;DME and/or AE instruction;Cognitive remediation/compensation    OT Goals(Current goals can be found in the care plan section) Acute Rehab OT Goals Patient Stated Goal: unable to verbalize ADL Goals Pt Will Perform Grooming: with modified independence;sitting Pt Will Perform Lower Body Dressing: sitting/lateral leans Pt Will Transfer to Toilet: with modified independence  OT Frequency: Min 3X/week   Barriers to D/C:            Co-evaluation PT/OT/SLP Co-Evaluation/Treatment: Yes Reason for Co-Treatment: Necessary to address cognition/behavior during functional activity;Complexity of the patient's impairments (multi-system involvement);To address functional/ADL transfers PT goals addressed during session: Mobility/safety with mobility;Balance OT goals addressed during  session: ADL's and self-care      AM-PAC OT "6 Clicks" Daily Activity     Outcome  Measure Help from another person eating meals?: Total Help from another person taking care of personal grooming?: A Lot Help from another person toileting, which includes using toliet, bedpan, or urinal?: Total Help from another person bathing (including washing, rinsing, drying)?: Total Help from another person to put on and taking off regular upper body clothing?: A Lot Help from another person to put on and taking off regular lower body clothing?: Total 6 Click Score: 8   End of Session Nurse Communication: Mobility status  Activity Tolerance: Patient limited by fatigue Patient left: in bed;with call bell/phone within reach;with family/visitor present  OT Visit Diagnosis: Unsteadiness on feet (R26.81);Other abnormalities of gait and mobility (R26.89);Muscle weakness (generalized) (M62.81)                Time: 0034-9179 OT Time Calculation (min): 29 min Charges:  OT General Charges $OT Visit: 1 Visit OT Treatments $Self Care/Home Management : 8-22 mins Shanon Payor, OTD OTR/L  11/04/20, 2:00 PM

## 2020-11-04 NOTE — Progress Notes (Signed)
Pt did not want to use ventilator last night. Pt remained on 28% ATC throughout the night. No apparent distress noted. NP and pt RN aware of patient's wishes.

## 2020-11-04 NOTE — Progress Notes (Signed)
[facility] for Electrolyte Monitoring and Replacement   Recent Labs: Potassium (mmol/L)  Date Value  11/04/2020 4.3   Magnesium (mg/dL)  Date Value  11/04/2020 2.2   Calcium (mg/dL)  Date Value  11/04/2020 8.2 (L)   Albumin (g/dL)  Date Value  11/04/2020 2.3 (L)   Phosphorus (mg/dL)  Date Value  11/04/2020 2.5   Sodium (mmol/L)  Date Value  11/04/2020 142    Assessment: 60 year old male presented with AMS. Patient found to have elevated creatinine and LFTs. Concern for NSTEMI with increased troponin; he received 48 hours of heparin. He has a history of laryngeal cancer with tracheostomy in place. Patient admitted to the ICU. Pharmacy consult to follow and replace electrolytes while in CCU.  Goal of Therapy:  Electrolytes WNL  Plan:  --Creatinine appears to have possibly peaked, now trending down --No replacement indicated today --Continue to follow along  Dallie Piles, PharmD, BCPS Clinical Pharmacist 11/04/2020 12:50 PM

## 2020-11-04 NOTE — Progress Notes (Signed)
Neuro: mental status varies from alert to flat affect and not interactive Resp: stable on 5L trach collar CV: Shows HTN throughout the shift, when pt is guided to keep arm straight and still his BP runs 170s/90s-MD aware, afebrile GIGU: foley in place-did not remove d/t patient's varying alertness/mental status, flexiseal in place, tolerating tube feeding well Skin:see flowsheets Social: wife visiting throughout the day, all questions and concerns addressed

## 2020-11-04 NOTE — Evaluation (Signed)
Physical Therapy Evaluation Patient Details Name: Ronald Murphy MRN: [id number] DOB: 1960/04/06 Today's Date: 11/04/2020  History of Present Illness  Pt is a 60 y/o M who presented to the ED from home on 10/30/20 due to his son's concern of AMS. Pt was admitted to the ICU on 10/18 & treated for severe transaminitis, possible sepsis, NTSEMI, AKI with severe hyperkalemia, and acute AMS. Pt found to have Tracheal aspirate with Staph aureus. PMH: laryngeal CA s/p tracheostomy, Hep C, alcoholism in remission, anxiety, depression, HTN  Clinical Impression  Pt seen for PT evaluation with co-tx with OT. Per chart pt was independent with ADL's prior to admission. Pt unable to provide reliable home set up & PLOF information; wife present at end of session but unable to provide this information accurately as well. Pt is able to consistently follow 1 step commands during session & participates in sit<>stand x 3 trials with max assist +2 but unable to achieve full upright posture. 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   BP in LUE at rest at beginning of session: 174/109 mmHg MAP 127 Rechecked: 173/106 mmHg MAP 124 -- MD cleared pt for participation in session BP at end of session supine in LUE: 199/110 mmHg MAP 134      Recommendations for follow up therapy are one component of a multi-disciplinary discharge planning process, led by the attending physician.  Recommendations may be updated based on patient status, additional functional criteria and insurance authorization.  Follow Up Recommendations CIR;Supervision/Assistance - 24 hour    Equipment Recommendations   (TBD in next venue)    Recommendations for Other Services       Precautions / Restrictions Precautions Precautions: Fall Precaution Comments: catheter, rectal tube, NG tube, chronic trach (on vent at  night) Restrictions Weight Bearing Restrictions: No      Mobility  Bed Mobility Overal bed mobility: Needs Assistance Bed Mobility: Supine to Sit;Sit to Supine     Supine to sit: Max assist;+2 for physical assistance Sit to supine: Max assist;+2 for physical assistance   General bed mobility comments: pt initiates moving BLE towards EOB but ultimately requires max assist +2 for supine<>sit, total assist to scoot to [facility]    Transfers Overall transfer level: Needs assistance Equipment used: 2 person hand held assist Transfers: Sit to/from Stand Sit to Stand: Max assist;+2 physical assistance;From elevated surface         General transfer comment: blocking BLE knees, performed 3 sit<>stands, requires cuing for upright posture but unable to shift pelvis anteriorly to achieve upright posture.  Ambulation/Gait                Stairs            Wheelchair Mobility    Modified Rankin (Stroke Patients Only)       Balance Overall balance assessment: Needs assistance Sitting-balance support: Feet supported;Single extremity supported Sitting balance-Leahy Scale: Poor Sitting balance - Comments: R lateral lean requiring assistance to correct, able to progress to CGA for static sitting   Standing balance support: Bilateral upper extremity supported;During functional activity Standing balance-Leahy Scale: Zero                               Pertinent Vitals/Pain Pain Assessment: Faces Faces Pain Scale: No hurt    Home Living Family/patient  expects to be discharged to:: Private residence Living Arrangements: Spouse/significant other (unsure if pt's son lives with him or only helps out as needed)               Additional Comments: Pt unable to provide home set up information, wife present but per chart is [facility] & has hx of dementia & she unable to provide accurate PLOF & home set up information    Prior Function           Comments: Per chart  pt was independent with ADLs     Hand Dominance        Extremity/Trunk Assessment   Upper Extremity Assessment Upper Extremity Assessment: Defer to OT evaluation;Generalized weakness    Lower Extremity Assessment Lower Extremity Assessment: Generalized weakness (2/5 knee extension in sitting, requires blocking BLE knees to attempt sit>stand)    Cervical / Trunk Assessment Cervical / Trunk Assessment: Kyphotic  Communication   Communication: Tracheostomy  Cognition Arousal/Alertness: Awake/alert Behavior During Therapy: Flat affect Overall Cognitive Status: Impaired/Different from baseline Area of Impairment: Orientation;Memory;Following commands;Attention;Awareness;Safety/judgement;Problem solving (Oriented to self only)                 Orientation Level: Disoriented to;Place;Time;Situation   Memory: Decreased recall of precautions;Decreased short-term memory Following Commands: Follows one step commands consistently;Follows one step commands with increased time Safety/Judgement: Decreased awareness of safety;Decreased awareness of deficits Awareness: Intellectual Problem Solving: Slow processing;Requires verbal cues;Requires tactile cues        General Comments General comments (skin integrity, edema, etc.): Pt required total assist for peri hygiene 2/2 BM leaking around rectal tubing. Pt tolerated tx well although did have elevated BP (but MD cleared him).    Exercises     Assessment/Plan    PT Assessment Patient needs continued PT services  PT Problem List Decreased strength;Decreased mobility;Decreased safety awareness;Decreased range of motion;Decreased coordination;Decreased knowledge of precautions;Decreased activity tolerance;Decreased cognition;Cardiopulmonary status limiting activity;Pain;Decreased knowledge of use of DME;Impaired sensation;Decreased balance;Decreased skin integrity       PT Treatment Interventions DME instruction;Therapeutic  activities;Modalities;Cognitive remediation;Therapeutic exercise;Gait training;Patient/family education;Stair training;Balance training;Functional mobility training;Neuromuscular re-education;Manual techniques    PT Goals (Current goals can be found in the Care Plan section)  Acute Rehab PT Goals Patient Stated Goal: pt unable to verbalize PT Goal Formulation: Patient unable to participate in goal setting Time For Goal Achievement: 11/18/20 Potential to Achieve Goals: Fair    Frequency 7X/week   Barriers to discharge Decreased caregiver support;Inaccessible home environment      Co-evaluation PT/OT/SLP Co-Evaluation/Treatment: Yes Reason for Co-Treatment: Necessary to address cognition/behavior during functional activity;Complexity of the patient's impairments (multi-system involvement);For patient/therapist safety;To address functional/ADL transfers PT goals addressed during session: Mobility/safety with mobility;Balance         AM-PAC PT "6 Clicks" Mobility  Outcome Measure Help needed turning from your back to your side while in a flat bed without using bedrails?: Total Help needed moving from lying on your back to sitting on the side of a flat bed without using bedrails?: Total Help needed moving to and from a bed to a chair (including a wheelchair)?: Total Help needed standing up from a chair using your arms (e.g., wheelchair or bedside chair)?: Total Help needed to walk in hospital room?: Total Help needed climbing 3-5 steps with a railing? : Total 6 Click Score: 6    End of Session Equipment Utilized During Treatment: Oxygen Activity Tolerance: Patient tolerated treatment well Patient left: in bed;with call bell/phone within reach;with bed alarm set  Nurse Communication: Mobility status (BP) PT Visit Diagnosis: Unsteadiness on feet (R26.81);Muscle weakness (generalized) (M62.81);Difficulty in walking, not elsewhere classified (R26.2)    Time: 1137-1206 PT Time  Calculation (min) (ACUTE ONLY): 29 min   Charges:   PT Evaluation $PT Eval High Complexity: 1 High PT Treatments $Therapeutic Activity: 8-22 mins        Lavone Nian, PT, DPT 11/04/20, 12:32 PM   [name] 11/04/2020, 12:26 PM

## 2020-11-04 NOTE — Progress Notes (Signed)
NAME:  Ronald Murphy, MRN:  [id number], DOB:  [birth year], LOS: 4 ADMISSION DATE:  10/30/2020, CONSULTATION DATE:  10/31/20 REFERRING MD:  Dr. Beather Arbour, CHIEF COMPLAINT:  AMS    Brief Synopsis:  Pneumonia with severe hypoxic resp failure with septic shock due to MSSA Pneumonia, acute ischemic cardiomyopathy with NSTEMI,  acute renal failure and liver failure and acidosis.  History of Present Illness:  60 year old male with PMHx of laryngeal cancer status post tracheostomy presented to [facility] ED from home via EMS on 10/30/2020 due to his son's concern of altered mental status.  Per his son's report he last saw his father well around 1 PM on Monday, 10/30/2020.  His father lives with his wife, for whom he is caretaker, as she has dementia and is very hard of hearing.  When his son returned from work after 8 PM on Monday, 10/30/2020 his father appeared altered leaning to his right side, unable to follow commands and unable to speak to him. Per his son his father is ambulatory, alert and oriented x4 and able to care for himself.  Per his son's report patient does have a history of alcoholism but currently only drinks nonalcoholic beer.  He is not aware of any previous stroke history. ED course: Per ED documentation on arrival patient was sluggish and would occasionally follow commands but not answering any questions.  The patient will look at you when you talk to him, but is not verbal. Medications given: 10 units of insulin and D50 given for hyperkalemia.  Heparin infusion started for NSTEMI Initial Vitals: T 99.1, RR 17, HR 93, BP 116/61 and SPO2 100% on trach collar at 10 L Significant labs: (Labs/ Imaging personally reviewed) I, Domingo Pulse Rust-[place]ster, AGACNP-BC, personally viewed and interpreted this ECG. EKG Interpretation: Date: 10/31/2019, EKG Time: 22:11, Rate: 102, Rhythm: Sinus tachycardia, QRS Axis: Normal, Intervals: Normal, ST/T Wave abnormalities: None, Narrative Interpretation: Sinus  tachycardia Chemistry: Na+: 129, K+: 6.3, BUN/Cr.:  34/3.64, Serum CO2/ AG: 26/13 Severe Transaminitis: AST 7291 ALT 2441 ammonia 51 total bilirubin 1.4 Hematology: WBC: 9.9, Hgb: 13.7,  Troponin: 17,932, Lactic/ PCT: 2.8/1.73, COVID-19 & Influenza A/B: Negative ABG: 7.31/51/169/25.7 CXR 10/30/2020: Diffuse hazy opacity in the left thorax which may be secondary to pneumonia CT head 10/30/2020: Bilateral basal ganglia lacunar infarcts age-indeterminate but favored chronic CT abdomen pelvis without contrast 10/30/2020: No evidence of bowel obstruction.  Sigmoid diverticulosis without evidence of diverticulitis.  Multiple nonobstructing right lower pole renal calculi measuring up to 7 mm, no hydronephrosis.  Cholelithiasis without cholecystitis.   PCCM consulted for admission due to complexity of patient with chronic tracheostomy requiring mechanical ventilatory support for respiratory acidosis and hypercapnia.  Pertinent  Medical History  Laryngeal cancer status post chronic tracheostomy Anxiety and depression Hepatitis C Hypertension Alcoholism in remission Kidney stones  Significant Hospital Events: Including procedures, antibiotic start and stop dates in addition to other pertinent events   10/31/20: Patient admitted to ICU with chronic tracheostomy requiring mechanical ventilatory support.  Complex patient with severe Transaminitis, possible sepsis, NSTEMI, acute kidney injury with severe hyperkalemia, and acute altered mental status. 10/19 remains on vent, weaned off pressors 10/20 remains on vent, low BP, ms changes, follow up NEURO RECS 10/21: Tolerating TC trials during day, vent at night; Tracheal aspirate with Staph aureus (MSSA), ABX changed to Cefazolin 10/22: Patient declined nocturnal ventilation. Tolerated TC well throughout the night    Interim History / Subjective:  No acute events. Remains HDS, afebrile Declined nocturnal ventilation. Stayed on  TC overnight without  issue Renal function continues to improve  Objective   Blood pressure (!) 156/90, pulse 87, temperature 98.2 F (36.8 C), temperature source Oral, resp. rate (!) 21, height 5\' 3"  (1.6 m), weight 78.8 kg, SpO2 95 %.    FiO2 (%):  [28 %] 28 %   Intake/Output Summary (Last 24 hours) at 11/04/2020 0944 Last data filed at 11/04/2020 0800 Gross per 24 hour  Intake 2060 ml  Output 2370 ml  Net -310 ml   Filed Weights   10/30/20 2212 11/01/20 0457 11/04/20 0500  Weight: 72.8 kg 76.8 kg 78.8 kg    Examination: General: chronically ill appearing, resting in bed watching TV. No apparent disstress HENT: anicteic sclera. Pupils equla in size. Moist oral mucosa. Trach in place Lungs: soft rhonchi bilaterally. Breathing nonlabored on trach collar Cardiovascular: RRR. No murmurs. No pedal edema. Cap refill < 2 seconds Abdomen: nondistended, nontender Extremities: no cyanosis. No clubbing Neuro: awake. Follows commands in all extremities GU: foley in place, clear yellow urine   Assessment & Plan:   #NSTEMI --cards following with plans for conservative medical management. Completed 48hrs of heparin gtt. --ASA/statin started. Cardiology will consider DAPT prior to discharge  #Acute hypoxic/hypercapnic respiratory failure #Septic Shock 2/2 MSSA PNA (resolved) --now weaned entirely from mechanical ventilation --Therapeutic plan: Day 2/5 cefazolin. Previously completed 2 days of ceftriaxone prior to culture data finalization Discontinue solumedrol Continue bronchodilators for now   #Acute metabolic encephalopathy #Bilateral basal ganglia lacunar infarct-new since May 2022 --slowly improving.  --Infarct suspected chronic in nature, due to son's story of acute change in mental status and difficulty with balance.  Stat telemetry neuro consult obtained to look over imaging and receive recommendations as patient is receiving high-dose heparin for N-STEMI. Per telemetry neurologist infarcts  appear chronic on imaging,  #Acute kidney injury 2/2 Rhabdomyolysis --probable ATN component. CK downtrending. Renal function slowly improving. Continue to monitor and provide supportive care  #Shock liver #Chronic hepatitis C --LFTs downtrending. Has underlying active HCV by chart review (+ viral load in [facility] system in 2020). Patient unable to comment on any prior attempts at eradication therapy. Can be followed up in the outpatient setting  #Pressure injuries of hip/knee/feet #Burn on left ear --supportive care, wound consult, Turn q2hrs  Best Practice (right click and "Reselect all SmartList Selections" daily)   Diet/type: tubefeeds DVT prophylaxis: prophylactic heparin  GI prophylaxis: N/A Lines: N/A Foley:  Yes, and it is no longer needed will discuss with RN Code Status:  full code Last date of multidisciplinary goals of care discussion [11/04/20]  Labs   CBC: Recent Labs  Lab 10/31/20 0421 11/01/20 0434 11/02/20 0820 11/03/20 0452 11/04/20 0436  WBC 9.8 7.0 12.8* 10.8* 11.8*  NEUTROABS  --  6.5 11.7* 9.4* 9.9*  HGB 11.3* 11.4* 13.2 11.3* 11.4*  HCT 33.0* 33.1* 39.0 33.6* 33.1*  MCV 88.5 86.4 87.1 86.6 87.3  PLT 139* 154 208 206 929    Basic Metabolic Panel: Recent Labs  Lab 10/31/20 0421 11/01/20 0434 11/02/20 0820 11/03/20 0452 11/04/20 0436  NA 130* 132* 136 140 142  K 5.0 4.5 4.4 4.6 4.3  CL 98 102 101 108 112*  CO2 24 23 23 24 25   GLUCOSE 103* 132* 131* 123* 121*  BUN 38* 58* 86* 99* 86*  CREATININE 3.16* 3.81* 3.96* 3.73* 2.98*  CALCIUM 7.5* 7.8* 8.3* 8.1* 8.2*  MG 2.2 2.3 2.5* 2.4 2.2  PHOS 5.5* 4.0 3.8 2.8 2.5   GFR: Estimated Creatinine  Clearance: 24.2 mL/min (A) (by C-G formula based on SCr of 2.98 mg/dL (H)). Recent Labs  Lab 10/30/20 2211 10/31/20 0152 10/31/20 0421 11/01/20 0434 11/02/20 0820 11/03/20 0452 11/04/20 0436  PROCALCITON 1.73  --   --   --   --   --   --   WBC 9.9  --    < > 7.0 12.8* 10.8* 11.8*  LATICACIDVEN 2.8*  1.7  --   --   --   --   --    < > = values in this interval not displayed.    Liver Function Tests: Recent Labs  Lab 10/31/20 0421 11/01/20 0434 11/02/20 0820 11/03/20 0452 11/04/20 0436  AST 4,293* 1,490* 711* 263* 113*  ALT 2,410* 1,790* 1,460* 854* 533*  ALKPHOS 74 77 100 77 75  BILITOT 0.9 0.6 0.6 0.5 0.7  PROT 6.1* 5.8* 6.5 5.6* 6.0*  ALBUMIN 2.5* 2.3* 2.6* 2.3* 2.3*   No results for input(s): LIPASE, AMYLASE in the last 168 hours. Recent Labs  Lab 10/31/20 0006 11/01/20 0434  AMMONIA 51* 34    ABG    Component Value Date/Time   PHART 7.42 11/03/2020 0846   PCO2ART 36 11/03/2020 0846   PO2ART 75 (L) 11/03/2020 0846   HCO3 23.4 11/03/2020 0846   ACIDBASEDEF 0.8 11/03/2020 0846   O2SAT 95.2 11/03/2020 0846     Coagulation Profile: Recent Labs  Lab 10/30/20 2211  INR 1.9*    Cardiac Enzymes: Recent Labs  Lab 10/31/20 0421 11/01/20 0434 11/02/20 0820 11/03/20 0452 11/04/20 0436  CKTOTAL 12,478* 5,684* 2,866* 1,588* 703*    HbA1C: Hgb A1c MFr Bld  Date/Time Value Ref Range Status  10/31/2020 08:59 AM 5.5 4.8 - 5.6 % Final    Comment:    (NOTE)         Prediabetes: 5.7 - 6.4         Diabetes: >6.4         Glycemic control for adults with diabetes: <7.0     CBG: Recent Labs  Lab 11/03/20 1511 11/03/20 1936 11/03/20 2311 11/04/20 0300 11/04/20 0724  GLUCAP 134* 117* 137* 122* 132*     Past Medical History:  He,  has a past medical history of Alcoholism in remission ([place]), Anxiety, Depression, Hepatitis C, Hypertension, Kidney stone, and Larynx cancer ([place]) (04/05/2017).   Surgical History:   Past Surgical History:  Procedure Laterality Date   SPINE SURGERY  1991     Social History:   reports that he quit smoking about 7 years ago. He has never used smokeless tobacco. He reports that he does not currently use alcohol. He reports that he does not currently use drugs after having used the following drugs: Marijuana.   Family  History:  His family history is not on file. He was adopted.   Allergies No Known Allergies   Home Medications  Prior to Admission medications   Medication Sig Start Date End Date Taking? Authorizing Provider  EUTHYROX 50 MCG tablet Take 50 mcg by mouth daily. 09/25/20  Yes [provider]  gabapentin (NEURONTIN) 300 MG capsule Take 300 mg by mouth at bedtime.   Yes [provider]  mirtazapine (REMERON) 45 MG tablet Take 45 mg by mouth at bedtime. 10/23/20  Yes [provider]  morphine (MS CONTIN) 30 MG 12 hr tablet Take 30-60 mg by mouth 2 (two) times daily. Take 30 mg by mouth every morning and 60 mg at bedtime 10/05/20  Yes [provider]  oxyCODONE (ROXICODONE) 15 MG immediate release tablet Take 15 mg by mouth every 4 (four) hours as needed for pain. 10/05/20  Yes [provider]  traZODone (DESYREL) 50 MG tablet Take 50-100 mg by mouth at bedtime. 09/16/20  Yes [provider]  clonazePAM (KLONOPIN) 0.5 MG tablet Take 0.5 mg by mouth 2 (two) times daily as needed for anxiety. Patient not taking: No sig reported    [provider]  escitalopram (LEXAPRO) 20 MG tablet Take 20 mg by mouth daily. Patient not taking: No sig reported    [provider]  ondansetron (ZOFRAN) 8 MG tablet Take by mouth every 8 (eight) hours as needed for nausea or vomiting.    [provider]  prochlorperazine (COMPAZINE) 10 MG tablet Take 10 mg by mouth every 6 (six) hours as needed for nausea or vomiting.    [provider]  tamsulosin (FLOMAX) 0.4 MG CAPS capsule Take 0.4 mg by mouth daily. Patient not taking: No sig reported    [provider]     Critical care time: 33 minutes

## 2020-11-05 DIAGNOSIS — J15211 Pneumonia due to Methicillin susceptible Staphylococcus aureus: Secondary | ICD-10-CM

## 2020-11-05 DIAGNOSIS — J9601 Acute respiratory failure with hypoxia: Secondary | ICD-10-CM | POA: Diagnosis not present

## 2020-11-05 DIAGNOSIS — B179 Acute viral hepatitis, unspecified: Secondary | ICD-10-CM

## 2020-11-05 DIAGNOSIS — I214 Non-ST elevation (NSTEMI) myocardial infarction: Secondary | ICD-10-CM | POA: Diagnosis not present

## 2020-11-05 LAB — CULTURE, BLOOD (ROUTINE X 2)
Culture: NO GROWTH
Culture: NO GROWTH
Special Requests: ADEQUATE

## 2020-11-05 LAB — CBC WITH DIFFERENTIAL/PLATELET
Abs Immature Granulocytes: 0.69 10*3/uL — ABNORMAL HIGH (ref 0.00–0.07)
Basophils Absolute: 0.1 10*3/uL (ref 0.0–0.1)
Basophils Relative: 1 %
Eosinophils Absolute: 0 10*3/uL (ref 0.0–0.5)
Eosinophils Relative: 0 %
HCT: 39.5 % (ref 39.0–52.0)
Hemoglobin: 13.4 g/dL (ref 13.0–17.0)
Immature Granulocytes: 6 %
Lymphocytes Relative: 5 %
Lymphs Abs: 0.7 10*3/uL (ref 0.7–4.0)
MCH: 30.2 pg (ref 26.0–34.0)
MCHC: 33.9 g/dL (ref 30.0–36.0)
MCV: 89 fL (ref 80.0–100.0)
Monocytes Absolute: 1.1 10*3/uL — ABNORMAL HIGH (ref 0.1–1.0)
Monocytes Relative: 9 %
Neutro Abs: 9.7 10*3/uL — ABNORMAL HIGH (ref 1.7–7.7)
Neutrophils Relative %: 79 %
Platelets: 225 10*3/uL (ref 150–400)
RBC: 4.44 MIL/uL (ref 4.22–5.81)
RDW: 15.4 % (ref 11.5–15.5)
WBC: 12.2 10*3/uL — ABNORMAL HIGH (ref 4.0–10.5)
nRBC: 0 % (ref 0.0–0.2)

## 2020-11-05 LAB — COMPREHENSIVE METABOLIC PANEL
ALT: 305 U/L — ABNORMAL HIGH (ref 0–44)
AST: 94 U/L — ABNORMAL HIGH (ref 15–41)
Albumin: 2.5 g/dL — ABNORMAL LOW (ref 3.5–5.0)
Alkaline Phosphatase: 77 U/L (ref 38–126)
Anion gap: 9 (ref 5–15)
BUN: 104 mg/dL — ABNORMAL HIGH (ref 8–23)
CO2: 25 mmol/L (ref 22–32)
Calcium: 8.6 mg/dL — ABNORMAL LOW (ref 8.9–10.3)
Chloride: 113 mmol/L — ABNORMAL HIGH (ref 98–111)
Creatinine, Ser: 2.08 mg/dL — ABNORMAL HIGH (ref 0.61–1.24)
GFR, Estimated: 36 mL/min — ABNORMAL LOW (ref >60)
Glucose, Bld: 117 mg/dL — ABNORMAL HIGH (ref 70–99)
Potassium: 4.1 mmol/L (ref 3.5–5.1)
Sodium: 147 mmol/L — ABNORMAL HIGH (ref 135–145)
Total Bilirubin: 0.7 mg/dL (ref 0.3–1.2)
Total Protein: 6.3 g/dL — ABNORMAL LOW (ref 6.5–8.1)

## 2020-11-05 LAB — GLUCOSE, CAPILLARY
Glucose-Capillary: 112 mg/dL — ABNORMAL HIGH (ref 70–99)
Glucose-Capillary: 115 mg/dL — ABNORMAL HIGH (ref 70–99)
Glucose-Capillary: 120 mg/dL — ABNORMAL HIGH (ref 70–99)
Glucose-Capillary: 123 mg/dL — ABNORMAL HIGH (ref 70–99)
Glucose-Capillary: 127 mg/dL — ABNORMAL HIGH (ref 70–99)
Glucose-Capillary: 130 mg/dL — ABNORMAL HIGH (ref 70–99)

## 2020-11-05 LAB — MAGNESIUM: Magnesium: 1.8 mg/dL (ref 1.7–2.4)

## 2020-11-05 LAB — METHYLMALONIC ACID, SERUM: Methylmalonic Acid, Quantitative: 516 nmol/L — ABNORMAL HIGH (ref 0–378)

## 2020-11-05 LAB — PHOSPHORUS: Phosphorus: 2.9 mg/dL (ref 2.5–4.6)

## 2020-11-05 LAB — CK: Total CK: 583 U/L — ABNORMAL HIGH (ref 49–397)

## 2020-11-05 MED ORDER — METOPROLOL TARTRATE 5 MG/5ML IV SOLN
5.0000 mg | INTRAVENOUS | Status: DC | PRN
  Administered 2020-11-05 – 2020-11-06 (×4): 5 mg via INTRAVENOUS
  Filled 2020-11-05 (×4): qty 5

## 2020-11-05 MED ORDER — MAGNESIUM SULFATE 2 GM/50ML IV SOLN
2.0000 g | Freq: Once | INTRAVENOUS | Status: AC
  Administered 2020-11-05: 2 g via INTRAVENOUS
  Filled 2020-11-05: qty 50

## 2020-11-05 MED ORDER — LEVOTHYROXINE SODIUM 50 MCG PO TABS
50.0000 ug | ORAL_TABLET | Freq: Every day | ORAL | Status: DC
  Administered 2020-11-06 – 2020-11-15 (×7): 50 ug
  Filled 2020-11-05 (×9): qty 1

## 2020-11-05 MED ORDER — CEFAZOLIN SODIUM-DEXTROSE 1-4 GM/50ML-% IV SOLN
1.0000 g | Freq: Three times a day (TID) | INTRAVENOUS | Status: DC
  Administered 2020-11-05 – 2020-11-06 (×3): 1 g via INTRAVENOUS
  Filled 2020-11-05 (×4): qty 50

## 2020-11-05 MED ORDER — METOPROLOL TARTRATE 25 MG PO TABS
25.0000 mg | ORAL_TABLET | Freq: Two times a day (BID) | ORAL | Status: DC
  Administered 2020-11-05 – 2020-11-15 (×19): 25 mg
  Filled 2020-11-05 (×20): qty 1

## 2020-11-05 MED ORDER — FREE WATER
50.0000 mL | Status: DC
  Administered 2020-11-05 – 2020-11-06 (×5): 50 mL

## 2020-11-05 NOTE — Progress Notes (Signed)
NAME:  Ronald Murphy, MRN:  [id number], DOB:  1960-06-07, LOS: 5 ADMISSION DATE:  10/30/2020, CONSULTATION DATE:  10/31/20 REFERRING MD:  Dr. Beather Arbour, CHIEF COMPLAINT:  AMS    Brief Synopsis:  Pneumonia with severe hypoxic resp failure with septic shock due to MSSA Pneumonia, acute ischemic cardiomyopathy with NSTEMI,  acute renal failure and liver failure and acidosis.  History of Present Illness:  60 year old male with PMHx of laryngeal cancer status post tracheostomy presented to [facility] ED from home via EMS on 10/30/2020 due to his son's concern of altered mental status.  Per his son's report he last saw his father well around 1 PM on Monday, 10/30/2020.  His father lives with his wife, for whom he is caretaker, as she has dementia and is very hard of hearing.  When his son returned from work after 8 PM on Monday, 10/30/2020 his father appeared altered leaning to his right side, unable to follow commands and unable to speak to him. Per his son his father is ambulatory, alert and oriented x4 and able to care for himself.  Per his son's report patient does have a history of alcoholism but currently only drinks nonalcoholic beer.  He is not aware of any previous stroke history. ED course: Per ED documentation on arrival patient was sluggish and would occasionally follow commands but not answering any questions.  The patient will look at you when you talk to him, but is not verbal. Medications given: 10 units of insulin and D50 given for hyperkalemia.  Heparin infusion started for NSTEMI Initial Vitals: T 99.1, RR 17, HR 93, BP 116/61 and SPO2 100% on trach collar at 10 L Significant labs: (Labs/ Imaging personally reviewed) I, Domingo Pulse Rust-[place], AGACNP-BC, personally viewed and interpreted this ECG. EKG Interpretation: Date: 10/31/2019, EKG Time: 22:11, Rate: 102, Rhythm: Sinus tachycardia, QRS Axis: Normal, Intervals: Normal, ST/T Wave abnormalities: None, Narrative Interpretation: Sinus  tachycardia Chemistry: Na+: 129, K+: 6.3, BUN/Cr.:  34/3.64, Serum CO2/ AG: 26/13 Severe Transaminitis: AST 7291 ALT 2441 ammonia 51 total bilirubin 1.4 Hematology: WBC: 9.9, Hgb: 13.7,  Troponin: 17,932, Lactic/ PCT: 2.8/1.73, COVID-19 & Influenza A/B: Negative ABG: 7.31/51/169/25.7 CXR 10/30/2020: Diffuse hazy opacity in the left thorax which may be secondary to pneumonia CT head 10/30/2020: Bilateral basal ganglia lacunar infarcts age-indeterminate but favored chronic CT abdomen pelvis without contrast 10/30/2020: No evidence of bowel obstruction.  Sigmoid diverticulosis without evidence of diverticulitis.  Multiple nonobstructing right lower pole renal calculi measuring up to 7 mm, no hydronephrosis.  Cholelithiasis without cholecystitis.   PCCM consulted for admission due to complexity of patient with chronic tracheostomy requiring mechanical ventilatory support for respiratory acidosis and hypercapnia.  Pertinent  Medical History  Laryngeal cancer status post chronic tracheostomy Anxiety and depression Hepatitis C Hypertension Alcoholism in remission Kidney stones  Significant Hospital Events: Including procedures, antibiotic start and stop dates in addition to other pertinent events   10/31/20: Patient admitted to ICU with chronic tracheostomy requiring mechanical ventilatory support.  Complex patient with severe Transaminitis, possible sepsis, NSTEMI, acute kidney injury with severe hyperkalemia, and acute altered mental status. 10/19 remains on vent, weaned off pressors 10/20 remains on vent, low BP, ms changes, follow up NEURO RECS 10/21: Tolerating TC trials during day, vent at night; Tracheal aspirate with Staph aureus (MSSA), ABX changed to Cefazolin 10/22: Patient declined nocturnal ventilation. Tolerated TC well throughout the night 11/05/20- patient stable ovenight without acute events. Now receiving NGT feeding. He is able to nod and respond with  eye contact and gesturing  to verbal communication. Vitals were stable with transient ST and nonlabored resp throgouthout examination, foley+ with adequate yellow urine. Labs include trending down ck, improving shock liver and aki/ckd profile post rescusitation. TRH to take over careplan.     Interim History / Subjective:  No acute events. Remains HDS, afebrile Declined nocturnal ventilation. Stayed on TC overnight without issue Renal function continues to improve  Objective   Blood pressure (!) 143/88, pulse 82, temperature 98.2 F (36.8 C), temperature source Oral, resp. rate 15, height 5\' 3"  (1.6 m), weight 76 kg, SpO2 98 %.    FiO2 (%):  [28 %] 28 %   Intake/Output Summary (Last 24 hours) at 11/05/2020 0822 Last data filed at 11/05/2020 0500 Gross per 24 hour  Intake 1591.37 ml  Output 3380 ml  Net -1788.63 ml    Filed Weights   11/01/20 0457 11/04/20 0500 11/05/20 0500  Weight: 76.8 kg 78.8 kg 76 kg    Examination: General: chronically ill appearing, resting in bed watching TV. No apparent disstress HENT: anicteic sclera. Pupils equla in size. Moist oral mucosa. Trach in place Lungs: soft rhonchi bilaterally. Breathing nonlabored on trach collar Cardiovascular: RRR. No murmurs. No pedal edema. Cap refill < 2 seconds Abdomen: nondistended, nontender Extremities: no cyanosis. No clubbing Neuro: awake. Follows commands in all extremities GU: foley in place, clear yellow urine   Assessment & Plan:   #NSTEMI --cards following with plans for conservative medical management. Completed 48hrs of heparin gtt. --ASA/statin started. Cardiology will consider DAPT prior to discharge  #Acute hypoxic/hypercapnic respiratory failure #Septic Shock 2/2 MSSA PNA (resolved) --now weaned entirely from mechanical ventilation --Therapeutic plan: Day 2/5 cefazolin. Previously completed 2 days of ceftriaxone prior to culture data finalization Discontinue solumedrol Continue bronchodilators for now   #Acute  metabolic encephalopathy #Bilateral basal ganglia lacunar infarct-new since May 2022 --slowly improving.  --Infarct suspected chronic in nature, due to son's story of acute change in mental status and difficulty with balance.  Stat telemetry neuro consult obtained to look over imaging and receive recommendations as patient is receiving high-dose heparin for N-STEMI. Per telemetry neurologist infarcts appear chronic on imaging,  #Acute kidney injury 2/2 Rhabdomyolysis --probable ATN component. CK downtrending. Renal function slowly improving. Continue to monitor and provide supportive care  #Shock liver #Chronic hepatitis C --LFTs downtrending. Has underlying active HCV by chart review (+ viral load in [facility] system in 2020). Patient unable to comment on any prior attempts at eradication therapy. Can be followed up in the outpatient setting  #Pressure injuries of hip/knee/feet #Burn on left ear --supportive care, wound consult, Turn q2hrs  Best Practice (right click and "Reselect all SmartList Selections" daily)   Diet/type: tubefeeds DVT prophylaxis: prophylactic heparin  GI prophylaxis: N/A Lines: N/A Foley:  Yes, and it is no longer needed will discuss with RN Code Status:  full code Last date of multidisciplinary goals of care discussion [11/04/20]  Labs   CBC: Recent Labs  Lab 11/01/20 0434 11/02/20 0820 11/03/20 0452 11/04/20 0436 11/05/20 0455  WBC 7.0 12.8* 10.8* 11.8* 12.2*  NEUTROABS 6.5 11.7* 9.4* 9.9* 9.7*  HGB 11.4* 13.2 11.3* 11.4* 13.4  HCT 33.1* 39.0 33.6* 33.1* 39.5  MCV 86.4 87.1 86.6 87.3 89.0  PLT 154 208 206 227 225     Basic Metabolic Panel: Recent Labs  Lab 11/01/20 0434 11/02/20 0820 11/03/20 0452 11/04/20 0436 11/05/20 0455  NA 132* 136 140 142 147*  K 4.5 4.4 4.6 4.3 4.1  CL 102 101 108 112* 113*  CO2 23 23 24 25 25   GLUCOSE 132* 131* 123* 121* 117*  BUN 58* 86* 99* 86* 104*  CREATININE 3.81* 3.96* 3.73* 2.98* 2.08*  CALCIUM 7.8* 8.3*  8.1* 8.2* 8.6*  MG 2.3 2.5* 2.4 2.2 1.8  PHOS 4.0 3.8 2.8 2.5 2.9    GFR: Estimated Creatinine Clearance: 34 mL/min (A) (by C-G formula based on SCr of 2.08 mg/dL (H)). Recent Labs  Lab 10/30/20 2211 10/31/20 0152 10/31/20 0421 11/02/20 0820 11/03/20 0452 11/04/20 0436 11/05/20 0455  PROCALCITON 1.73  --   --   --   --   --   --   WBC 9.9  --    < > 12.8* 10.8* 11.8* 12.2*  LATICACIDVEN 2.8* 1.7  --   --   --   --   --    < > = values in this interval not displayed.     Liver Function Tests: Recent Labs  Lab 11/01/20 0434 11/02/20 0820 11/03/20 0452 11/04/20 0436 11/05/20 0455  AST 1,490* 711* 263* 113* 94*  ALT 1,790* 1,460* 854* 533* 305*  ALKPHOS 77 100 77 75 77  BILITOT 0.6 0.6 0.5 0.7 0.7  PROT 5.8* 6.5 5.6* 6.0* 6.3*  ALBUMIN 2.3* 2.6* 2.3* 2.3* 2.5*    No results for input(s): LIPASE, AMYLASE in the last 168 hours. Recent Labs  Lab 10/31/20 0006 11/01/20 0434  AMMONIA 51* 34     ABG    Component Value Date/Time   PHART 7.42 11/03/2020 0846   PCO2ART 36 11/03/2020 0846   PO2ART 75 (L) 11/03/2020 0846   HCO3 25.6 11/04/2020 1604   ACIDBASEDEF 0.8 11/03/2020 0846   O2SAT 95.5 11/04/2020 1604      Coagulation Profile: Recent Labs  Lab 10/30/20 2211  INR 1.9*     Cardiac Enzymes: Recent Labs  Lab 11/01/20 0434 11/02/20 0820 11/03/20 0452 11/04/20 0436 11/05/20 0455  CKTOTAL 5,684* 2,866* 1,588* 703* 583*     HbA1C: Hgb A1c MFr Bld  Date/Time Value Ref Range Status  10/31/2020 08:59 AM 5.5 4.8 - 5.6 % Final    Comment:    (NOTE)         Prediabetes: 5.7 - 6.4         Diabetes: >6.4         Glycemic control for adults with diabetes: <7.0     CBG: Recent Labs  Lab 11/04/20 1653 11/04/20 2026 11/04/20 2359 11/05/20 0455 11/05/20 0729  GLUCAP 114* 108* 112* 120* 115*      Past Medical History:  He,  has a past medical history of Alcoholism in remission ([place]), Anxiety, Depression, Hepatitis C, Hypertension, Kidney  stone, and Larynx cancer ([place]) (04/05/2017).   Surgical History:   Past Surgical History:  Procedure Laterality Date   SPINE SURGERY  1991     Social History:   reports that he quit smoking about 7 years ago. He has never used smokeless tobacco. He reports that he does not currently use alcohol. He reports that he does not currently use drugs after having used the following drugs: Marijuana.   Family History:  His family history is not on file. He was adopted.   Allergies No Known Allergies   Home Medications  Prior to Admission medications   Medication Sig Start Date End Date Taking? Authorizing Provider  EUTHYROX 50 MCG tablet Take 50 mcg by mouth daily. 09/25/20  Yes [provider]  gabapentin (NEURONTIN) 300 MG capsule  Take 300 mg by mouth at bedtime.   Yes [provider]  mirtazapine (REMERON) 45 MG tablet Take 45 mg by mouth at bedtime. 10/23/20  Yes [provider]  morphine (MS CONTIN) 30 MG 12 hr tablet Take 30-60 mg by mouth 2 (two) times daily. Take 30 mg by mouth every morning and 60 mg at bedtime 10/05/20  Yes [provider]  oxyCODONE (ROXICODONE) 15 MG immediate release tablet Take 15 mg by mouth every 4 (four) hours as needed for pain. 10/05/20  Yes [provider]  traZODone (DESYREL) 50 MG tablet Take 50-100 mg by mouth at bedtime. 09/16/20  Yes [provider]  clonazePAM (KLONOPIN) 0.5 MG tablet Take 0.5 mg by mouth 2 (two) times daily as needed for anxiety. Patient not taking: No sig reported    [provider]  escitalopram (LEXAPRO) 20 MG tablet Take 20 mg by mouth daily. Patient not taking: No sig reported    [provider]  ondansetron (ZOFRAN) 8 MG tablet Take by mouth every 8 (eight) hours as needed for nausea or vomiting.    [provider]  prochlorperazine (COMPAZINE) 10 MG tablet Take 10 mg by mouth every 6 (six) hours as needed for nausea or vomiting.    [provider]  tamsulosin (FLOMAX) 0.4 MG CAPS capsule Take 0.4 mg by mouth daily. Patient not taking: No sig reported    [provider]     Critical care time: 33 minutes

## 2020-11-05 NOTE — Progress Notes (Signed)
PHARMACY NOTE:  ANTIMICROBIAL RENAL DOSAGE ADJUSTMENT  Current antimicrobial regimen includes a mismatch between antimicrobial dosage and estimated renal function.  As per policy approved by the Pharmacy & Therapeutics and Medical Executive Committees, the antimicrobial dosage will be adjusted accordingly.  Current antimicrobial dosage:  cefazolin 2 grams IV every 12 hours  Indication: MSSA PNA  Renal Function:  Estimated Creatinine Clearance: 34 mL/min (A) (by C-G formula based on SCr of 2.08 mg/dL (H)). []      On intermittent HD, scheduled: []      On CRRT    Antimicrobial dosage has been changed to:  cefazolin 2 grams IV every 8 hours   Thank you for allowing pharmacy to be a part of this patient's care.  Dallie Piles, [facility] 11/05/2020 7:32 AM

## 2020-11-05 NOTE — Progress Notes (Signed)
Physical Therapy Treatment Patient Details Name: Ronald Murphy MRN: [id number] DOB: 1960-12-08 Today's Date: 11/05/2020   History of Present Illness Pt is a 60 y/o M who presented to the ED from home on 10/30/20 due to his son's concern of AMS. Pt was admitted to the ICU on 10/18 & treated for severe transaminitis, possible sepsis, NTSEMI, AKI with severe hyperkalemia, and acute AMS. Pt found to have Tracheal aspirate with Staph aureus. PMH: laryngeal CA s/p tracheostomy, Hep C, alcoholism in remission, anxiety, depression, HTN    PT Comments    Pt seen for PT tx with nurses in room & assisting with peri hygiene 2/2 incontinent BM. Pt requires max assist +2 for supine>sit but pt is able to participate in moving BLE to EOB & demonstrates improving ability to maintain upright static sitting at EOB. Pt participates in 2 sit<>stand from EOB with max assist with PT blocking BLE knees & shifting pelvis anteriorly to help facilitate upright posture. Pt is able to progress to pivoting to recliner with PT providing manual facilitation for weight shifting L<>R & advancing BLE. Pt continues to make good progress & would benefit from ongoing acute PT services to progress mobility as able. Pt continues to remain strong CIR candidate as pt was independent & caring for his wife prior to admission.   Recommendations for follow up therapy are one component of a multi-disciplinary discharge planning process, led by the attending physician.  Recommendations may be updated based on patient status, additional functional criteria and insurance authorization.  Follow Up Recommendations  CIR;Supervision/Assistance - 24 hour     Equipment Recommendations   (TBD in next venue)    Recommendations for Other Services       Precautions / Restrictions Precautions Precautions: Fall Precaution Comments: catheter, NG tube, chronic trach on 5L O2 Restrictions Weight Bearing Restrictions: No     Mobility  Bed  Mobility Overal bed mobility: Needs Assistance Bed Mobility: Supine to Sit     Supine to sit: Max assist;+2 for physical assistance;HOB elevated     General bed mobility comments: pt participates in moving BLE to EOB, requires assistance to upright trunk & fully scoot to sitting EOB    Transfers Overall transfer level: Needs assistance   Transfers: Sit to/from Stand;Stand Pivot Transfers Sit to Stand: Max assist;+2 physical assistance (PT's blocking BLE knees and helping to facilitate anterior pelvic shift for upright posture; STS x 2-3 times during session) Stand pivot transfers: Mod assist;Max assist;+2 physical assistance (Pt assisted with stand pivot to recliner on R. PT provides manual facilitation for weight shifting & advancing BLE to help take steps. Pt is able to take 1 step with RLE but very minimal step length.)          Ambulation/Gait                 Stairs             Wheelchair Mobility    Modified Rankin (Stroke Patients Only)       Balance Overall balance assessment: Needs assistance Sitting-balance support: Feet supported;Bilateral upper extremity supported Sitting balance-Leahy Scale: Fair     Standing balance support: Bilateral upper extremity supported;During functional activity Standing balance-Leahy Scale: Zero Standing balance comment: max assist +2                            Cognition Arousal/Alertness: Awake/alert Behavior During Therapy: Flat affect Overall Cognitive Status: Impaired/Different from baseline  Area of Impairment: Orientation;Memory;Following commands;Safety/judgement;Awareness;Problem solving;Attention                 Orientation Level: Disoriented to;Place;Time;Situation   Memory: Decreased short-term memory;Decreased recall of precautions Following Commands: Follows one step commands with increased time Safety/Judgement: Decreased awareness of safety;Decreased awareness of  deficits Awareness: Intellectual Problem Solving: Slow processing;Requires verbal cues;Requires tactile cues        Exercises      General Comments        Pertinent Vitals/Pain Pain Assessment: Faces Faces Pain Scale: No hurt    Home Living                      Prior Function            PT Goals (current goals can now be found in the care plan section) Acute Rehab PT Goals Patient Stated Goal: unable to verbalize PT Goal Formulation: Patient unable to participate in goal setting Time For Goal Achievement: 11/18/20 Potential to Achieve Goals: Fair Progress towards PT goals: Progressing toward goals    Frequency    7X/week      PT Plan Current plan remains appropriate    Co-evaluation              AM-PAC PT "6 Clicks" Mobility   Outcome Measure  Help needed turning from your back to your side while in a flat bed without using bedrails?: Total Help needed moving from lying on your back to sitting on the side of a flat bed without using bedrails?: Total Help needed moving to and from a bed to a chair (including a wheelchair)?: Total Help needed standing up from a chair using your arms (e.g., wheelchair or bedside chair)?: Total Help needed to walk in hospital room?: Total Help needed climbing 3-5 steps with a railing? : Total 6 Click Score: 6    End of Session Equipment Utilized During Treatment: Oxygen (via trach collar) Activity Tolerance: Patient tolerated treatment well Patient left: in chair;with call bell/phone within reach;with nursing/sitter in room Nurse Communication: Mobility status PT Visit Diagnosis: Unsteadiness on feet (R26.81);Muscle weakness (generalized) (M62.81);Difficulty in walking, not elsewhere classified (R26.2)     Time: 6767-2094 PT Time Calculation (min) (ACUTE ONLY): 15 min  Charges:  $Therapeutic Activity: 8-22 mins                     Lavone Nian, PT, DPT 11/05/20, 2:30 PM    [name] 11/05/2020, 2:26 PM

## 2020-11-05 NOTE — Progress Notes (Signed)
Neuro: mental status varies from alert to flat affect and not interactive but more alert today than yesterday, out of the bed to chair this afternoon-requires 2 person assist, eager to move and participate Resp: stable on 5L trach collar, strong cough, clearing secretions well CV: afebrile, continues to be hypertensive, notified Dr. Posey Pronto- see MAR for new orders, IV metoprolol fairly effective GIGU: foley in place-did not remove d/t patient's varying alertness/mental status, flexiseal removed overnight, multiple liquid BMs today, tolerating tube feeding well Skin:see flowsheets Social: wife and son visiting throughout the day, all questions and concerns addressed

## 2020-11-05 NOTE — Progress Notes (Signed)
[facility] for Electrolyte Monitoring and Replacement   Recent Labs: Potassium (mmol/L)  Date Value  11/05/2020 4.1   Magnesium (mg/dL)  Date Value  11/05/2020 1.8   Calcium (mg/dL)  Date Value  11/05/2020 8.6 (L)   Albumin (g/dL)  Date Value  11/05/2020 2.5 (L)   Phosphorus (mg/dL)  Date Value  11/05/2020 2.9   Sodium (mmol/L)  Date Value  11/05/2020 147 (H)   Corrected Ca: 9.8 mg/dL  Assessment: 60 year old male presented with AMS. Patient found to have elevated creatinine and LFTs. Concern for NSTEMI with increased troponin; he received 48 hours of heparin. He has a history of laryngeal cancer with tracheostomy in place. Patient admitted to the ICU. Pharmacy consult to follow and replace electrolytes while in CCU.  Nutrition: Vital AF tube feeds at 68mL/hr  Goal of Therapy:  Electrolytes WNL  Plan:  2 grams IV magnesium sulfate x 1 (magnesium is borderline low) Free water was increased to 50 mL every 4 hours by provider for hyponatremia Recheck electrolytes in am  Dallie Piles, PharmD, BCPS Clinical Pharmacist 11/05/2020 11:21 AM

## 2020-11-05 NOTE — Progress Notes (Signed)
[facility] [place] NAME: Ronald Murphy    MR#:  [id number]  DATE OF BIRTH:  1960-10-09  SUBJECTIVE:   patient minimally responsive. Nods yes or no to few questions. Wife at bedside apparently she has dementia and hearing loss REVIEW OF SYSTEMS:   Review of Systems  Unable to perform ROS: Mental status change  Tolerating Diet:ng feeding Tolerating PT:   DRUG ALLERGIES:  No Known Allergies  VITALS:  Blood pressure (!) 143/88, pulse 82, temperature 98.2 F (36.8 C), temperature source Oral, resp. rate 15, height 5\' 3"  (1.6 m), weight 76 kg, SpO2 98 %.  PHYSICAL EXAMINATION:   Physical Exam  GENERAL:  60 y.o.-year-old patient lying in the bed with no acute distress. Weekly ill HEENT: Head atraumatic, normocephalic.NG+ NECK: trach collar + LUNGS: Normal breath sounds bilaterally, no wheezing, rales, rhonchi. No use of accessory muscles of respiration.  CARDIOVASCULAR: S1, S2 normal. No murmurs, rubs, or gallops.  ABDOMEN: Soft, nontender, nondistended. FOLEY+ EXTREMITIES: No cyanosis, clubbing or edema b/l.    NEUROLOGIC: unable to assess due to pt participation PSYCHIATRIC:  patient is alert   SKIN:  Pressure Injury 10/31/20 Buttocks Left Stage 1 -  Intact skin with non-blanchable redness of a localized area usually over a bony prominence. (Active)  10/31/20 0300  Location: Buttocks  Location Orientation: Left  Staging: Stage 1 -  Intact skin with non-blanchable redness of a localized area usually over a bony prominence.  Wound Description (Comments):   Present on Admission: Yes     Pressure Injury Knee Anterior;Right Stage 1 -  Intact skin with non-blanchable redness of a localized area usually over a bony prominence. (Active)     Location: Knee  Location Orientation: Anterior;Right  Staging: Stage 1 -  Intact skin with non-blanchable redness of a localized area usually over a bony prominence.  Wound Description (Comments):    Present on Admission:          LABORATORY PANEL:  CBC Recent Labs  Lab 11/05/20 0455  WBC 12.2*  HGB 13.4  HCT 39.5  PLT 225    Chemistries  Recent Labs  Lab 11/05/20 0455  NA 147*  K 4.1  CL 113*  CO2 25  GLUCOSE 117*  BUN 104*  CREATININE 2.08*  CALCIUM 8.6*  MG 1.8  AST 94*  ALT 305*  ALKPHOS 77  BILITOT 0.7   Cardiac Enzymes No results for input(s): TROPONINI in the last 168 hours. RADIOLOGY:  No results found. ASSESSMENT AND PLAN:   Ronald Murphy is a 60 y.o. 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  --Completed 48hrs of heparin gtt. --ASA/statin started. Cardiology will consider DAPT prior to discharge   #Acute hypoxic/hypercapnic respiratory failure #Septic Shock 2/2 MSSA PNA (resolved) --now weaned entirely from mechanical ventilation --on IV Day cefazolin. Previously completed 2 days of ceftriaxone prior to culture data finalization --Discontinue solumedrol --Continue bronchodilators for now     -- patient currently on trach collar   #  Acute metabolic encephalopathy #Bilateral basal ganglia lacunar infarct-new since May 2022 --slowly improving.  --Infarct suspected chronic in nature, due to son's story of acute change in mental status and difficulty with balance.  [facility] telemetry neuro consult obtained to look over imaging and receive recommendations as patient  is receiving high-dose heparin for N-STEMI. Per telemetry neurologist infarcts appear chronic on imaging -- continue aspirin   #Acute kidney injury 2/2 Rhabdomyolysis --probable ATN component. CK downtrending. Renal function slowly improving. Continue to monitor and provide supportive care  -- good urine output -- creatinine trending down 3.61--3.81--2.9--2.08  #Shock liver-- please significantly improve #Chronic hepatitis C --LFTs downtrending. Has underlying active HCV by chart review (+ viral load in [facility] system in 2020). Can be followed up in the outpatient setting   #Pressure injuries of hip/knee/feet #Burn on left ear --supportive care, wound consult, Turn q2hrs    Procedures: Family communication : wife at bedside Consults : CODE STATUS: full code DVT Prophylaxis : heparin Level of care: Progressive Cardiac Status is: Inpatient  Remains inpatient appropriate because: quite sick critically ill        TOTAL TIME TAKING CARE OF THIS PATIENT: 35 minutes.  >50% time spent on counselling and coordination of care  Note: This dictation was prepared with Dragon dictation along with smaller phrase technology. Any transcriptional errors that result from this process are unintentional.  Fritzi Mandes M.D    [facility]   CC: Primary care physician; Pcp, No Patient ID: Ronald Murphy, male   DOB: 05-02-1960, 60 y.o.   MRN: [id number]

## 2020-11-06 DIAGNOSIS — E43 Unspecified severe protein-calorie malnutrition: Secondary | ICD-10-CM

## 2020-11-06 DIAGNOSIS — J9600 Acute respiratory failure, unspecified whether with hypoxia or hypercapnia: Secondary | ICD-10-CM | POA: Diagnosis not present

## 2020-11-06 DIAGNOSIS — E870 Hyperosmolality and hypernatremia: Secondary | ICD-10-CM | POA: Diagnosis not present

## 2020-11-06 DIAGNOSIS — I214 Non-ST elevation (NSTEMI) myocardial infarction: Secondary | ICD-10-CM | POA: Diagnosis not present

## 2020-11-06 DIAGNOSIS — N179 Acute kidney failure, unspecified: Secondary | ICD-10-CM | POA: Diagnosis not present

## 2020-11-06 LAB — BASIC METABOLIC PANEL
Anion gap: 5 (ref 5–15)
BUN: 92 mg/dL — ABNORMAL HIGH (ref 8–23)
CO2: 29 mmol/L (ref 22–32)
Calcium: 8.8 mg/dL — ABNORMAL LOW (ref 8.9–10.3)
Chloride: 116 mmol/L — ABNORMAL HIGH (ref 98–111)
Creatinine, Ser: 1.52 mg/dL — ABNORMAL HIGH (ref 0.61–1.24)
GFR, Estimated: 52 mL/min — ABNORMAL LOW (ref >60)
Glucose, Bld: 115 mg/dL — ABNORMAL HIGH (ref 70–99)
Potassium: 3.8 mmol/L (ref 3.5–5.1)
Sodium: 150 mmol/L — ABNORMAL HIGH (ref 135–145)

## 2020-11-06 LAB — GLUCOSE, CAPILLARY
Glucose-Capillary: 110 mg/dL — ABNORMAL HIGH (ref 70–99)
Glucose-Capillary: 113 mg/dL — ABNORMAL HIGH (ref 70–99)
Glucose-Capillary: 152 mg/dL — ABNORMAL HIGH (ref 70–99)

## 2020-11-06 LAB — MAGNESIUM: Magnesium: 2.3 mg/dL (ref 1.7–2.4)

## 2020-11-06 MED ORDER — CEFAZOLIN SODIUM-DEXTROSE 2-4 GM/100ML-% IV SOLN
2.0000 g | Freq: Three times a day (TID) | INTRAVENOUS | Status: AC
  Administered 2020-11-06 – 2020-11-08 (×6): 2 g via INTRAVENOUS
  Filled 2020-11-06 (×7): qty 100

## 2020-11-06 MED ORDER — FREE WATER: 125.0000 mL | Status: DC

## 2020-11-06 MED ORDER — HYDRALAZINE HCL 50 MG PO TABS
50.0000 mg | ORAL_TABLET | Freq: Three times a day (TID) | ORAL | Status: DC
  Administered 2020-11-06 – 2020-11-14 (×23): 50 mg
  Filled 2020-11-06 (×24): qty 1

## 2020-11-06 MED ORDER — FREE WATER
100.0000 mL | Status: DC
  Administered 2020-11-06 (×2): 100 mL

## 2020-11-06 MED ORDER — DEXTROSE 5 % IV SOLN: INTRAVENOUS | Status: DC

## 2020-11-06 MED ORDER — IPRATROPIUM-ALBUTEROL 0.5-2.5 (3) MG/3ML IN SOLN
3.0000 mL | Freq: Four times a day (QID) | RESPIRATORY_TRACT | Status: DC | PRN
  Administered 2020-11-10 – 2020-11-13 (×2): 3 mL via RESPIRATORY_TRACT
  Filled 2020-11-06 (×2): qty 3

## 2020-11-06 MED ORDER — VITAL 1.5 CAL PO LIQD: 1000.0000 mL | ORAL | Status: DC

## 2020-11-06 NOTE — Progress Notes (Signed)
Inpatient Rehab Admissions Coordinator:   Spoke to pt's son over the phone to review CIR recommendations and goals/expectations of CIR stay.  I reviewed 3 hrs/day of therapy with goals of discharge home, likely with recommendations for 24/7 supervision.  I reviewed average length of stay ~2 weeks, and need for prior authorization, as well as the fact that [place] would not likely approve SNF following CIR stay.  Pt's son reports he works part time and all are hopeful for CIR, so will try to get as much coverage/supervision as possible.  I will discuss with our MDs in the morning whether this will suffice.    Shann Medal, PT, DPT Admissions Coordinator [phone] 11/06/20  4:24 PM

## 2020-11-06 NOTE — Care Management Important Message (Signed)
Important Message  Patient Details  Name: BREYLEN AGYEMAN MRN: [id number] Date of Birth: [birth year]   Medicare Important Message Given:  Yes     Dannette Barbara 11/06/2020, 4:06 PM

## 2020-11-06 NOTE — Progress Notes (Signed)
[facility] for Electrolyte Monitoring and Replacement   Recent Labs: Potassium (mmol/L)  Date Value  11/06/2020 3.8   Magnesium (mg/dL)  Date Value  11/06/2020 2.3   Calcium (mg/dL)  Date Value  11/06/2020 8.8 (L)   Albumin (g/dL)  Date Value  11/05/2020 2.5 (L)   Phosphorus (mg/dL)  Date Value  11/05/2020 2.9   Sodium (mmol/L)  Date Value  11/06/2020 150 (H)    Assessment: 60 year old male presented with AMS. Patient found to have elevated creatinine and LFTs. Concern for NSTEMI with increased troponin; he received 48 hours of heparin. He has a history of laryngeal cancer with tracheostomy in place. Patient admitted to the ICU. Pharmacy consult to follow and replace electrolytes while in CCU.  Nutrition: Vital AF tube feeds at 103mL/hr + free water flushes 50 mL q4h (300 mL/day)  Goal of Therapy:  Electrolytes within normal limits  Plan:  --Worsening hypernatremia / hyperchloremia. Defer management to hospitalist --No electrolyte replacement warranted at this time --Will continue to follow along  Benita Gutter 11/06/2020 7:40 AM

## 2020-11-06 NOTE — Progress Notes (Signed)
Occupational Therapy Treatment Patient Details Name: Ronald Murphy MRN: [id number] DOB: [birth year] Today's Date: 11/06/2020   History of present illness Pt is a 60 y/o M who presented to the ED from home on 10/30/20 due to his son's concern of AMS. Pt was admitted to the ICU on 10/18 & treated for severe transaminitis, possible sepsis, NTSEMI, AKI with severe hyperkalemia, and acute AMS. Pt found to have Tracheal aspirate with Staph aureus. PMH: laryngeal CA s/p tracheostomy, Hep C, alcoholism in remission, anxiety, depression, HTN   OT comments  Pt seen for OT tx after finishing up with SLP and having NG tube removed. PMV in place and pt able to speak clearly when prompted. Pt alert and following commands with cues and +time. Pt in recliner, required cues for hand placement and Mod A for STS transfer (nurse tech acting as +1 for lines/leads mgt). Pt performed SPT to EOB with Min-Mod A + cues for sequencing. VSS throughout. Mod A to return to bed, nurse tech +1 for lines/leads. Able to bridge hips to adjust positioning in bed with cues to initiate. Set up with applesauce and pudding once back in bed.  HOB elevated all the way. Pt able to verbalize precautions for small bites when asked and demo'd good carryover. Wife and son in room at end of session. Son instructed in optimal furniture at home and ways to modify home for improved safety/indep with ADL/mobility. Son very Patent attorney. Pt is making excellent progress towards goals. Continue to strongly recommend CIR at this time given previous independent baseline and functional progress to date.    Recommendations for follow up therapy are one component of a multi-disciplinary discharge planning process, led by the attending physician.  Recommendations may be updated based on patient status, additional functional criteria and insurance authorization.    Follow Up Recommendations  Acute inpatient rehab (3hours/day)    Assistance Recommended at  Discharge    Equipment Recommendations  Other (comment) (defer to next venue of care, anticipate need for [facility])    Recommendations for Other Services      Precautions / Restrictions Precautions Precautions: Fall Precaution Comments: NG tube removed today, chronic trach on 5L O2 Restrictions Weight Bearing Restrictions: No       Mobility Bed Mobility Overal bed mobility: Needs Assistance Bed Mobility: Sit to Supine      Sit to supine: Mod assist;+2 for safety/equipment   General bed mobility comments: Mod A for trunk support and BLE mgt back to bed, nurse tech assist for lines/leads    Transfers Overall transfer level: Needs assistance Equipment used: Rolling walker (2 wheels) Transfers: Sit to/from [employer] Transfers Sit to Stand: Mod assist;+2 safety/equipment Stand pivot transfers: Min assist;Mod assist;+2 safety/equipment         General transfer comment: Assist for lines/leads, Min VC for sequencing, hand placement     Balance Overall balance assessment: Needs assistance Sitting-balance support: Feet supported;Bilateral upper extremity supported Sitting balance-Leahy Scale: Fair Sitting balance - Comments: static sitting EOB x 5 minutes with close supervision   Standing balance support: Bilateral upper extremity supported;During functional activity Standing balance-Leahy Scale: Poor Standing balance comment: CGA-Min A in standing, does require BUE support on RW                           ADL either performed or assessed with clinical judgement   ADL   Eating/Feeding: Sitting;Set up;Supervision/ safety Eating/Feeding Details (indicate cue type and reason): PRN  VC for small bites, pt demo's great follow through for minimizing aspiration risk                         Toileting- Clothing Manipulation and Hygiene: Sit to/from stand;Maximal assistance Toileting - Clothing Manipulation Details (indicate cue type and reason): While  standing EOB, OT assisting with standing balance/safety + RW, nurse tech provided max A for pericare 2/2 impaired balance without BUE support on RW             Vision       Perception     Praxis      Cognition Arousal/Alertness: Awake/alert Behavior During Therapy: Flat affect Overall Cognitive Status: Impaired/Different from baseline Area of Impairment: Memory;Problem solving;Following commands;Safety/judgement                 Orientation Level: Disoriented to;Place;Time;Situation   Memory: Decreased short-term memory Following Commands: Follows one step commands with increased time Safety/Judgement: Decreased awareness of safety;Decreased awareness of deficits Awareness: Intellectual Problem Solving: Slow processing;Decreased initiation;Requires verbal cues            Exercises Other Exercises Other Exercises: Son instructed in home/routines modifications and ideal furniture height for improved ease with transfers   Shoulder Instructions       General Comments Pt incontinent of BM & requires total assist for peri hygiene.    Pertinent Vitals/ Pain       Pain Assessment: No/denies pain Faces Pain Scale: No hurt  Home Living Family/patient expects to be discharged to:: Private residence Living Arrangements: Spouse/significant other (adult son) Available Help at Discharge: Family Type of Home: House Home Access: Stairs to enter Technical brewer of Steps: 4-5 Entrance Stairs-Rails: Right;Left;Can reach both Home Layout: One level     Bathroom Shower/Tub:  (has access to tub shower & walk in shower)                    Prior Functioning/Environment              Frequency  Min 3X/week        Progress Toward Goals  OT Goals(current goals can now be found in the care plan section)  Progress towards OT goals: Progressing toward goals     Plan Discharge plan remains appropriate;Frequency remains appropriate     Co-evaluation                 AM-PAC OT "6 Clicks" Daily Activity     Outcome Measure   Help from another person eating meals?: A Little Help from another person taking care of personal grooming?: A Little Help from another person toileting, which includes using toliet, bedpan, or urinal?: A Lot Help from another person bathing (including washing, rinsing, drying)?: A Lot Help from another person to put on and taking off regular upper body clothing?: A Lot Help from another person to put on and taking off regular lower body clothing?: A Lot 6 Click Score: 14    End of Session    OT Visit Diagnosis: Unsteadiness on feet (R26.81);Other abnormalities of gait and mobility (R26.89);Muscle weakness (generalized) (M62.81)   Activity Tolerance Patient tolerated treatment well   Patient Left in bed;with call bell/phone within reach;with family/visitor present;with bed alarm set;Other (comment) (all lines/leads in place, PMV on)   Nurse Communication Mobility status        Time: 3474-2595 OT Time Calculation (min): 17 min  Charges: OT General Charges $OT Visit: 1 Visit OT  Treatments $Self Care/Home Management : 8-22 mins  Ardeth Perfect., MPH, MS, OTR/L ascom [phone] 11/06/20, 3:36 PM

## 2020-11-06 NOTE — Progress Notes (Signed)
Ronald Murphy NAME: Ronald Murphy    MR#:  [id number]  DATE OF BIRTH:  09/22/1960  SUBJECTIVE:   patient very alert and oriented. Pointing towards removing his NG tube. He wants to drink water. No family at bedside REVIEW OF SYSTEMS:   Review of Systems  Unable to perform ROS: Mental status change  Tolerating Diet:ng feeding Tolerating PT: CIR  DRUG ALLERGIES:  No Known Allergies  VITALS:  Blood pressure (!) 178/107, pulse 80, temperature 97.7 F (36.5 C), temperature source Axillary, resp. rate (!) 24, height 5\' 3"  (1.6 m), weight 76 kg, SpO2 100 %.  PHYSICAL EXAMINATION:   Physical Exam  GENERAL:  60 y.o.-year-old patient lying in the bed with no acute distress. Weekly ill HEENT: Head atraumatic, normocephalic.NG+ NECK: trach shiley cuffed #6  LUNGS: Normal breath sounds bilaterally, no wheezing, rales, rhonchi. No use of accessory muscles of respiration.  CARDIOVASCULAR: S1, S2 normal. No murmurs, rubs, or gallops.  ABDOMEN: Soft, nontender, nondistended. EXTREMITIES: No cyanosis, clubbing or edema b/l.    NEUROLOGIC: unable to assess due to pt participation PSYCHIATRIC:  patient is alert   SKIN:  Pressure Injury 10/31/20 Buttocks Left Stage 1 -  Intact skin with non-blanchable redness of a localized area usually over a bony prominence. (Active)  10/31/20 0300  Location: Buttocks  Location Orientation: Left  Staging: Stage 1 -  Intact skin with non-blanchable redness of a localized area usually over a bony prominence.  Wound Description (Comments):   Present on Admission: Yes     Pressure Injury Knee Anterior;Right Stage 1 -  Intact skin with non-blanchable redness of a localized area usually over a bony prominence. (Active)     Location: Knee  Location Orientation: Anterior;Right  Staging: Stage 1 -  Intact skin with non-blanchable redness of a localized area usually over a bony prominence.  Wound Description  (Comments):   Present on Admission:      Pressure Injury 11/05/20 Shoulder Left;Posterior Stage 1 -  Intact skin with non-blanchable redness of a localized area usually over a bony prominence. (Active)  11/05/20 0800  Location: Shoulder  Location Orientation: Left;Posterior  Staging: Stage 1 -  Intact skin with non-blanchable redness of a localized area usually over a bony prominence.  Wound Description (Comments):   Present on Admission:          LABORATORY PANEL:  CBC Recent Labs  Lab 11/05/20 0455  WBC 12.2*  HGB 13.4  HCT 39.5  PLT 225     Chemistries  Recent Labs  Lab 11/05/20 0455 11/06/20 0505  NA 147* 150*  K 4.1 3.8  CL 113* 116*  CO2 25 29  GLUCOSE 117* 115*  BUN 104* 92*  CREATININE 2.08* 1.52*  CALCIUM 8.6* 8.8*  MG 1.8 2.3  AST 94*  --   ALT 305*  --   ALKPHOS 77  --   BILITOT 0.7  --     Cardiac Enzymes No results for input(s): TROPONINI in the last 168 hours. RADIOLOGY:  No results found. ASSESSMENT AND PLAN:   Ronald Murphy is a 60 y.o. 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  --Completed 48hrs of heparin gtt. --ASA/statin started. Cardiology will consider DAPT prior to discharge   #Acute hypoxic/hypercapnic respiratory failure #Septic Shock 2/2 MSSA PNA (resolved) --now weaned entirely from mechanical ventilation --on IV Day cefazolin. Previously  completed 2 days of ceftriaxone prior to culture data finalization --Discontinue solumedrol --Continue bronchodilators for now     -- patient currently on trach collar --10/24-- plan to change to passi muir valve   #Acute metabolic encephalopathy #Bilateral basal ganglia lacunar infarct-new since May 2022 --slowly improving.  --Infarct suspected chronic in nature, due to son's story of acute change in mental status and difficulty with balance.  Stat telemetry neuro consult obtained to look over imaging and receive recommendations as patient is receiving high-dose heparin for N-STEMI. Per telemetry neurologist infarcts appear chronic on imaging -- continue aspirin   #Acute kidney injury 2/2 Rhabdomyolysis Hypernatremia --probable ATN component. CK downtrending. Renal function slowly improving. Continue to monitor and provide supportive care  -- good urine output -- creatinine trending down 3.61--3.81--2.9--2.08--1.5 --D5 water at 75 cc/hr, increase free h2o flushes  #Shock liver-- please significantly improve #Chronic hepatitis C --LFTs downtrending. Has underlying active HCV by chart review (+ viral load in [facility] system in 2020). Can be followed up in the outpatient setting   #Pressure injuries of hip/knee/feet #Burn on left ear --supportive care, wound consult, Turn q2hrs    Procedures: Family communication : tried call Georgiana Spinner (no VM) and Joey did not pick up the phone Consults :Speech CODE STATUS: full code DVT Prophylaxis : heparin Level of care: Progressive Cardiac Status is: Inpatient  Remains inpatient appropriate because: quite sick critically ill        TOTAL TIME TAKING CARE OF THIS PATIENT: 25 minutes.  >50% time spent on counselling and coordination of care  Note: This dictation was prepared with Dragon dictation along with smaller phrase technology. Any transcriptional errors that result from this process are unintentional.  Fritzi Mandes M.D    [facility]   CC: Primary care physician; Pcp, No Patient ID: Ronald Murphy, male   DOB: Nov 29, 1960, 60 y.o.   MRN: [id number]

## 2020-11-06 NOTE — Progress Notes (Addendum)
Physical Therapy Treatment Patient Details Name: Ronald Murphy MRN: [id number] DOB: 1960-12-20 Today's Date: 11/06/2020   History of Present Illness Pt is a 60 y/o M who presented to the ED from home on 10/30/20 due to his son's concern of AMS. Pt was admitted to the ICU on 10/18 & treated for severe transaminitis, possible sepsis, NTSEMI, AKI with severe hyperkalemia, and acute AMS. Pt found to have Tracheal aspirate with Staph aureus. PMH: laryngeal CA s/p tracheostomy, Hep C, alcoholism in remission, anxiety, depression, HTN    PT Comments    Pt seen for PT tx with son present for session. Son reports pt was independent without AD, driving, living in 1 level home with 4-5 steps to enter with B rails. Discussed d/c recommendations with son with son reporting he can provide assistance upon pt's return home. On this date, pt requires max assist +1 for supine>sit but has progressed to sit>stand with mod assist & stand pivot with mod assist +1 with use of RW. Pt is even able to progress to taking a few steps with RW before requiring seated rest break 2/2 fatigue. Continue to recommend CIR as pt remains a great candidate with good family support.   Addendum: Pt performs BLE LAQ x 10 repetitions each LE while sitting EOB for BLE strengthening.    Recommendations for follow up therapy are one component of a multi-disciplinary discharge planning process, led by the attending physician.  Recommendations may be updated based on patient status, additional functional criteria and insurance authorization.  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 Restrictions Weight Bearing Restrictions: No     Mobility  Bed Mobility Overal bed  mobility: Needs Assistance Bed Mobility: Supine to Sit     Supine to sit: Max assist;HOB elevated     General bed mobility comments: Multimodal cuing to initiate, max assist to move BLE to EOB, pt participates in uprighting trunk but requires max assist overall for supine>sit & scooting to sit EOB    Transfers Overall transfer level: Needs assistance Equipment used: Rolling walker (2 wheels) Transfers: Sit to/from [facility] Sit to Stand: Mod assist Stand pivot transfers: Mod assist         General transfer comment: PT provides multimodal cuing & instruction for safe hand placement with pt requiring HOH. Pt is able to advance BLE & step to recliner without assistance to step today.    Ambulation/Gait Ambulation/Gait assistance: Mod assist Gait Distance (Feet): 2 Feet Assistive device: Rolling walker (2 wheels) Gait Pattern/deviations: Decreased step length - right;Decreased step length - left;Decreased stride length;Decreased weight shift to left;Decreased dorsiflexion - right;Decreased dorsiflexion - left;Decreased weight shift to right Gait velocity: decreased   General Gait Details: significantly decreased gait speed   Stairs             Wheelchair Mobility    Modified Rankin (Stroke Patients Only)       Balance Overall balance assessment: Needs assistance Sitting-balance support: Feet supported;Bilateral upper extremity supported Sitting balance-Leahy Scale: Fair Sitting balance - Comments: static sitting EOB x 5 minutes with close supervision   Standing balance support: Bilateral upper extremity supported;During functional activity Standing balance-Leahy Scale: Poor Standing balance comment: BUE support on RW &  mod assist, improving ability to shift pelvis anteriorly & achieve upright posture                            Cognition Arousal/Alertness: Awake/alert Behavior During Therapy: Flat affect Overall Cognitive Status:  Impaired/Different from baseline Area of Impairment: Orientation;Memory;Following commands;Safety/judgement;Awareness;Problem solving;Attention                 Orientation Level: Disoriented to;Place;Time;Situation   Memory: Decreased short-term memory Following Commands: Follows one step commands with increased time Safety/Judgement: Decreased awareness of safety;Decreased awareness of deficits Awareness: Intellectual Problem Solving: Slow processing;Requires verbal cues;Requires tactile cues;Decreased initiation          Exercises      General Comments General comments (skin integrity, edema, etc.): Pt incontinent of BM & requires total assist for peri hygiene.      Pertinent Vitals/Pain Pain Assessment: Faces Faces Pain Scale: No hurt    Home Living                          Prior Function            PT Goals (current goals can now be found in the care plan section) Acute Rehab PT Goals Patient Stated Goal: unable to verbalize PT Goal Formulation: Patient unable to participate in goal setting Time For Goal Achievement: 11/18/20 Potential to Achieve Goals: Fair Progress towards PT goals: Progressing toward goals    Frequency    7X/week      PT Plan Current plan remains appropriate    Co-evaluation              AM-PAC PT "6 Clicks" Mobility   Outcome Measure  Help needed turning from your back to your side while in a flat bed without using bedrails?: A Lot Help needed moving from lying on your back to sitting on the side of a flat bed without using bedrails?: A Lot Help needed moving to and from a bed to a chair (including a wheelchair)?: A Lot Help needed standing up from a chair using your arms (e.g., wheelchair or bedside chair)?: A Lot Help needed to walk in hospital room?: A Lot Help needed climbing 3-5 steps with a railing? : Total 6 Click Score: 11    End of Session Equipment Utilized During Treatment: Oxygen;Gait  belt Activity Tolerance: Patient tolerated treatment well Patient left: in chair;with chair alarm set;with family/visitor present;with nursing/sitter in room Nurse Communication: Mobility status PT Visit Diagnosis: Unsteadiness on feet (R26.81);Muscle weakness (generalized) (M62.81);Difficulty in walking, not elsewhere classified (R26.2)     Time: 1132-1200 PT Time Calculation (min) (ACUTE ONLY): 28 min  Charges:  $Therapeutic Activity: 23-37 mins                     Lavone Nian, PT, DPT 11/06/20, 12:42 PM    [name] 11/06/2020, 12:29 PM

## 2020-11-06 NOTE — Progress Notes (Signed)
Nutrition Follow-up  DOCUMENTATION CODES:  Severe malnutrition in context of chronic illness  INTERVENTION:  Continue TF via NGT until diet can be advanced. Recommend the following: Vital 1.5 @60ml /hr Free water flushes 125 ml q4 hours Regimen provides 2160 kcal/day, 97g/day protein and 1862ml/day of free water.  Juven Fruit Punch BID via tube, each serving provides 95kcal and 2.5g of protein (amino acids glutamine and arginine)  NUTRITION DIAGNOSIS:  Severe Malnutrition related to cancer and cancer related treatments as evidenced by severe fat depletion, severe muscle depletion.  GOAL:  Patient will meet greater than or equal to 90% of their needs  MONITOR:  Labs, I & O's, TF tolerance, Skin  REASON FOR ASSESSMENT:  Ventilator    ASSESSMENT:  60 y/o male with h/o SCC lung, laryngeal cancer s/p chemotherapy/XRT, radiation induced esophageal stricture s/p dilation, chronic trach, recurrent aspiration PNA, Hep C, anxiety, depression, etoh abuse (remission) and HTN who is admitted with lacunar infarct, CAP, septic shock and NSTEMI.  10/18 - admitted to ICU, ventilated via trach. NGT placed, feeds initiated 10/22 - completely weaned from vent 10/23 - transferred out of ICU  Pt's TF continue to infuse at goal rate with good tolerance. Pt moved out of the ICU 10/23 with slowly improving mentation. Will increase nutrition needs now that pt has been weaned from the vent. Noted that Na and chloride trending up, Md increased free water flushes this AM. Will increase slightly more as new TF formula contains less free water.   Nutritionally Relevant Medications: Scheduled Meds:  free water  100 mL Per Tube Q4H   levothyroxine  50 mcg Per Tube Daily   nutrition supplement (JUVEN)  1 packet Per Tube BID BM   pantoprazole (PROTONIX) IV  40 mg Intravenous QHS   Continuous Infusions:   ceFAZolin (ANCEF) IV 2 g (11/06/20 1354)   dextrose 75 mL/hr at 11/06/20 9379   feeding supplement (VITAL  AF 1.2 CAL) 1,000 mL (11/05/20 0226)   PRN Meds:.docusate, polyethylene glycol  Labs Reviewed: Na 150, Chloride 116 BUN 92, creatinine 1.52 SBG ranges from 110-152 mg/dL over the last 24 hours  NUTRITION - FOCUSED PHYSICAL EXAM: Flowsheet Row Most Recent Value  Orbital Region Moderate depletion  Upper Arm Region Severe depletion  Thoracic and Lumbar Region Severe depletion  Buccal Region Severe depletion  Temple Region Moderate depletion  Clavicle Bone Region Moderate depletion  Clavicle and Acromion Bone Region Moderate depletion  Scapular Bone Region Severe depletion  Dorsal Hand Severe depletion  Patellar Region Severe depletion  Anterior Thigh Region Severe depletion  Posterior Calf Region Severe depletion  Edema (RD Assessment) None  Hair Reviewed  Eyes Reviewed  Mouth Reviewed  Skin Reviewed  Nails Reviewed   Diet Order:   Diet Order             Diet NPO time specified  Diet effective now                  EDUCATION NEEDS:  No education needs have been identified at this time  Skin:  Skin Assessment: Reviewed RN Assessment (Stage I buttoks/hip, Stage I knee, burn L ear)  Last BM:  10/24 - type 6  Height:  Ht Readings from Last 1 Encounters:  10/30/20 5\' 3"  (1.6 m)    Weight:  Wt Readings from Last 1 Encounters:  11/05/20 76 kg    Ideal Body Weight:  56.36 kg  BMI:  Body mass index is 29.68 kg/m.  Estimated Nutritional Needs:  Kcal:  1900-2100 kcal/d Protein:  95-110 g/d Fluid:  1.8-2 L/d  Ranell Patrick, RD, LDN Clinical Dietitian RD pager # available in [place]  After hours/weekend pager # available in [facility]

## 2020-11-06 NOTE — Evaluation (Signed)
Clinical/Bedside Swallow Evaluation Patient Details  Name: ZEN CEDILLOS MRN: [id number] Date of Birth: 1960-12-26  Today's Date: 11/06/2020 Time: SLP Start Time (ACUTE ONLY): 45 SLP Stop Time (ACUTE ONLY): 1500 SLP Time Calculation (min) (ACUTE ONLY): 75 min  Past Medical History:  Past Medical History:  Diagnosis Date   Alcoholism in remission ([place])    Anxiety    Depression    Hepatitis C    Hypertension    Kidney stone    Larynx cancer ([place]) 04/05/2017   Stage 3   Past Surgical History:  Past Surgical History:  Procedure Laterality Date   SPINE SURGERY  [id number]   HPI:  60 year old male with PMHx of laryngeal cancer status post tracheostomy presented to [facility] ED from home via EMS on 10/30/2020 due to his son's concern of altered mental status. 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  Per his son's report he last saw his father well around 1 PM on Monday, 10/30/2020.  His father lives with his wife, for whom he is caretaker, as she has dementia and is very hard of hearing.  When his son returned from work after 8 PM on Monday, 10/30/2020 his father appeared altered, unable to follow commands and unable to speak to him. Per his son his father is ambulatory, alert and oriented x4 and able to care for himself.  Per his son's report patient does have a history of alcoholism but currently only drinks nonalcoholic beer.  He is not aware of any previous stroke history.   CXR - Substantially regressed left lung opacity with mild residual.  No new cardiopulmonary abnormality; CT head - Bilateral basal ganglia  lacunar infarcts, age indeterminate but favor chronic -- chronic changes (see all Imaging in chart including 05/2020 incuding CT and PET scans in 2022).  Pt placed on vent support post admit d/t status and was felt to have multifactorial encephalopathy versus diffuse anoxic injury in the setting of possible cardiac event given his troponinemia, and started on heparin drip for concern for ACS.  Per CCU/Pulmonogy note: dx includes Pneumonia with severe hypoxic resp failure with septic shock and acute ischemic cardiomyopathy with NSTEMI with acute renal failure and liver failure and acidosis.  Weaned from vent support on 11/04/2020 and remains on Trach Collar O2 support, tracheostomy.    Assessment / Plan / Recommendation  Clinical Impression   Pt seen for both PMV  and BSE evaluations during this session. Pt is on TC on 5L. Pt was awake/alert, nodding to communicate; gestures. He required min verbal cues to follow through w/ general instructions/precautions; mild tremorous activity in U/LEs and H&N/oral cavity. MD noted this as well; d/t impact of illness. Shiley #6, cuffed trach.   PMV EVAL: Explained the use and wear of the PMV to pt; trach and stoma area inspected; ensured Cuff was deflated before placing the PMV. Pt's respiratory effort appeared calm w/ no labored effort. RR: 19-21, O2 sats 96%, HR 80's. Cuff deflated at baseline. Pt tolerated both finger occlusion tasks then PMV placement w/out difficulty. Verbalizations and brief conversation were c/b adequate in volume and intelligibility w/ fair-good breath support/effort, though noted recent ENT note in 09/2020 describing "adducted position of the TVCs". Pt's  breath support for speech was adequate to support his speech/volume; no WOB observed. Pt denied feeling andy distress or SOB. PMV placement was tolerated during sessions w/out noted O2 desaturation, or significant change in RR/HR from his baseline -- O2 sats remained 96-99%; RR in low 20s. No overt  discomfort noted in his respiratory effort -- pt stated it felt "fine" to wear/talk w/. PMV was allowed to remain on as pt completed BSE.   Pt appears to adequately tolerate PMV placement w/out overt, gross respiratory discomfort or distress; suspect he is at/close to his Baseline during wear/use. Pt has a Shiley trach size, #6, flexible trach. Much education was given on PMV use/wear, MUST have Cuff deflation for PMV wear, checking and removing the air from the balloon b/f placing, placing/removing the PMV, and care of the PMV. Discussed that is MUST be worn for all eating/drinking; and can be work w/ Therapies(OT, PT). Must NOT be worn when sleeping. Encouraged Rest Breaks at times during the day. Precautions posted at bedside and in chart. The above was discussed w/ pt/Son present. Wife has Dementia per report.  Pt remains w/ a Cuffed trach at this time. Sticker placed on Cuff line, and in room. NSG made aware. MD updated. ST services will continue to monitor for any further needs while admitted; ability to move to a cuffless trach.      DYSPHAGIA - BSE:  Pt seen for BSE. Pt reported he is eating a "regular" diet at home; he is Edentulous. He is able to wear the PMV comfortably now for verbal communication and for po intake. See session noted above. Pt is on NG TFs for support currently.  Pt explained general aspiration precautions and agreed verbally to the need for following them especially sitting upright for all oral intake, No Talking w/ food and drink in mouth, and wearing PMV for ALL oral intake. Pt assisted w/ sitting up and using multiple pillows behind him for a more forward position. Pt was then given trials of ice chips, thin and Nectar liquids, and purees. Overt clinical s/s of aspiration were noted w/ thin liquids: delayed/immediate coughing; throat clearing - 3/3 trials. No O2 desaturation during the trials. NO overt clinical s/s of aspiration were noted w/ Single ice chips; Nectar liquids  and puree: No coughing, respiratory status remained calm w/ no O2 desaturation; vocal quality clear b/t trials. O2 sats 97-98% during po trials. Pt held Cup when drinking and fed self following instructions for single, small sips slowly given intermittent verbal cues. Straws were utilized for better oral control d/t the presence of NGT. Oral phase appeared [facility] and improved for bolus management and timely A-P transfer for swallowing -- no solids given this evaluation d/t Edentulous status. Oral clearing achieved w/ all consistencies. Suspect impact on Timing of pharyngeal swallow -- MBSS would be beneficial.     Discussed pt's presentation w/ NSG and MD; MD agreed w/ removal of NGT and initiation of oral, dysphagia diet w/ precautions. NSG removed NGT at end of session.   Recommended a Dysphagia level 1 diet (puree foods) w/ gravies added to moisten foods; Nectar liquids. Recommend strict aspiration precautions; Pills Crushed vs Whole in Puree for ease of swallowing; tray setup and positioning assistance Upright for meals. PMV MUST BE PLACED FOR ALL ORAL INTAKE. Monitoring w/ all oral intake. ST services will continue to f/u w/ pt for toleration of diet and trials to upgrade to Minced foods in diet, objective swallow assessment(MBSS) as indicated, and education  on precautions. NSG and MD updated on above. Precautions posted at bedside for PMV wear/use and aspiration precautions. Son given education on above.  SLP Visit Diagnosis: Dysphagia, pharyngeal phase (R13.13);Aphonia (R49.1) (tracheostomy)    Aspiration Risk  Mild aspiration risk;Moderate aspiration risk;Risk for inadequate nutrition/hydration    Diet Recommendation  Dysphagia level 1 diet (puree foods) w/ gravies added to moisten foods; Nectar liquids. Recommend strict aspiration precautions. PMV MUST BE PLACED FOR ALL ORAL INTAKE.  Medication Administration: Whole meds with puree (vs Crushed in puree)    Other  Recommendations Recommended  Consults:  (Dietician f/u) Oral Care Recommendations: Oral care BID;Oral care before and after PO;Patient independent with oral care (setup) Other Recommendations: Order thickener from pharmacy;Prohibited food (jello, ice cream, thin soups);Remove water pitcher;Have oral suction available    Recommendations for follow up therapy are one component of a multi-disciplinary discharge planning process, led by the attending physician.  Recommendations may be updated based on patient status, additional functional criteria and insurance authorization.  Follow up Recommendations Inpatient Rehab (TBD)      Frequency and Duration min 3x week  2 weeks       Prognosis Prognosis for Safe Diet Advancement: Fair (-Good) Barriers to Reach Goals: Time post onset;Severity of deficits Barriers/Prognosis Comment: s/p laryngeal Ca and txs; tracheostomy      Swallow Study   General Date of Onset: 10/31/20 HPI: 60 year old male with PMHx of laryngeal cancer status post tracheostomy presented to [facility] ED from home via EMS on 10/30/2020 due to his son's concern of altered mental status. 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  Per his son's report he last saw his father well around 1 PM on Monday, 10/30/2020.  His father lives with his wife, for whom he is caretaker, as she has dementia and is very hard of hearing.  When his son returned from work after 8 PM on Monday, 10/30/2020 his father appeared altered, unable to follow commands and unable to speak to him. Per his son his father is ambulatory, alert and oriented x4 and able to care  for himself.  Per his son's report patient does have a history of alcoholism but currently only drinks nonalcoholic beer.  He is not aware of any previous stroke history.   CXR - Substantially regressed left lung opacity with mild residual.  No new cardiopulmonary abnormality; CT head - Bilateral basal ganglia lacunar infarcts, age indeterminate but favor chronic -- chronic changes (see all Imaging in chart including 05/2020 incuding CT and PET scans in 2022).  Pt placed on vent support post admit d/t status and was felt to have multifactorial encephalopathy versus diffuse anoxic injury in the setting of possible cardiac event given his troponinemia, and started on heparin drip for concern for ACS.  Per CCU/Pulmonogy note: dx includes Pneumonia with severe hypoxic resp failure with septic shock and acute ischemic cardiomyopathy with NSTEMI with acute renal failure and liver failure and acidosis.  Weaned from vent support on 11/04/2020 and remains on Trach Collar O2 support, tracheostomy. Type of Study: Bedside Swallow Evaluation Previous Swallow Assessment: previous assessment in 2019(?); PMV assessment in 2021 at [place] Prior to this Study: NG Tube (regular diet at home per pt)  Temperature Spikes Noted: No (wbc 12.2) Respiratory Status: Trach Collar (5L) History of Recent Intubation: No (trachesotomy) Behavior/Cognition: Alert;Cooperative;Pleasant mood;Requires cueing (min) Oral Cavity Assessment: Dry (sticky) Oral Care Completed by SLP: Yes Oral Cavity - Dentition: Edentulous (does not wear dentures) Vision: Functional for self-feeding Self-Feeding Abilities: Able to feed self;Needs set up Patient Positioning: Upright in chair Baseline Vocal Quality: Normal (grossly WFL) Volitional Cough: Strong Volitional Swallow: Able to elicit    Oral/Motor/Sensory Function Overall Oral Motor/Sensory Function: Within functional limits   Ice Chips Ice chips: Within functional limits Presentation: Spoon (fed;  4 trials)   Thin Liquid Thin Liquid: Impaired Presentation: Cup;Self Fed (3 trials) Oral Phase Impairments:  (none) Pharyngeal  Phase Impairments: Throat Clearing - Delayed;Cough - Immediate;Cough - Delayed    Nectar Thick Nectar Thick Liquid: Within functional limits Presentation: Cup;Self Fed;Straw (~3 ozs)   Honey Thick Honey Thick Liquid: Not tested   Puree Puree: Within functional limits Presentation: Self Fed;Spoon (4 ozs)   Solid     Solid: Not tested        Orinda Kenner, MS, CCC-SLP Speech Language Pathologist Rehab Services [phone] Cherity Blickenstaff 11/06/2020,6:38 PM

## 2020-11-07 DIAGNOSIS — N179 Acute kidney failure, unspecified: Secondary | ICD-10-CM | POA: Diagnosis not present

## 2020-11-07 DIAGNOSIS — Z515 Encounter for palliative care: Secondary | ICD-10-CM

## 2020-11-07 DIAGNOSIS — Z930 Tracheostomy status: Secondary | ICD-10-CM

## 2020-11-07 DIAGNOSIS — J9600 Acute respiratory failure, unspecified whether with hypoxia or hypercapnia: Secondary | ICD-10-CM | POA: Diagnosis not present

## 2020-11-07 DIAGNOSIS — C3490 Malignant neoplasm of unspecified part of unspecified bronchus or lung: Secondary | ICD-10-CM

## 2020-11-07 DIAGNOSIS — J15211 Pneumonia due to Methicillin susceptible Staphylococcus aureus: Secondary | ICD-10-CM | POA: Diagnosis not present

## 2020-11-07 DIAGNOSIS — K222 Esophageal obstruction: Secondary | ICD-10-CM

## 2020-11-07 DIAGNOSIS — Z8521 Personal history of malignant neoplasm of larynx: Secondary | ICD-10-CM

## 2020-11-07 DIAGNOSIS — Z789 Other specified health status: Secondary | ICD-10-CM

## 2020-11-07 DIAGNOSIS — E870 Hyperosmolality and hypernatremia: Secondary | ICD-10-CM | POA: Diagnosis not present

## 2020-11-07 DIAGNOSIS — I214 Non-ST elevation (NSTEMI) myocardial infarction: Secondary | ICD-10-CM | POA: Diagnosis not present

## 2020-11-07 LAB — BASIC METABOLIC PANEL
Anion gap: 8 (ref 5–15)
BUN: 67 mg/dL — ABNORMAL HIGH (ref 8–23)
CO2: 27 mmol/L (ref 22–32)
Calcium: 8.4 mg/dL — ABNORMAL LOW (ref 8.9–10.3)
Chloride: 110 mmol/L (ref 98–111)
Creatinine, Ser: 1.19 mg/dL (ref 0.61–1.24)
GFR, Estimated: >60 mL/min (ref >60)
Glucose, Bld: 134 mg/dL — ABNORMAL HIGH (ref 70–99)
Potassium: 2.8 mmol/L — ABNORMAL LOW (ref 3.5–5.1)
Sodium: 145 mmol/L (ref 135–145)

## 2020-11-07 NOTE — Progress Notes (Signed)
Speech Language Pathology Treatment: Dysphagia;Passy Muir Speaking valve  Patient Details Name: Ronald Murphy MRN: [id number] DOB: 1960/04/03 Today's Date: 11/07/2020 Time: 2229-7989 SLP Time Calculation (min) (ACUTE ONLY): 65 min  Assessment / Plan / Recommendation Clinical Impression  Pt seen for both PMV tx session for toleration of PMV wear/use then toleration of po's/oral diet -- oral diet (dysphagia 1 w/ Nectar liquids) established yesterday. Pt continues on TC O2 support Baseline. Pt was alert, responsive to direct questions during session; few verbal cues intermittently for direction and follow through w/ general instructions/precautions. Some responses seemed min slow, basic. Did not engage in spontaneous conversation w/ SLP or NSG when present.    Explained the use and wear of the PMV to pt; trach and stoma area inspected; ensured Cuff was deflated placing the PMV. Pt's respiratory effort appeared [facility]. RR: 21, O2 sats 98%, HR 84-98. FiO2 21% at 5L per chart. Cuff deflated at baseline; slight secretions. Pt verbalized desire for breakfast tray. Verbalizations were c/b adequate in volume and intelligibility w/ fair-good breath support/effort, though min gravely. Encouraged pt to focus on using good breath support to support his speech/volume. PMV placement was tolerated during sessions w/out noted O2 desaturation, or significant change in RR/HR from his baseline. No overt discomfort noted in his respiratory effort -- pt stated it felt "fine" to wear/talk w/. No increased effort noted in respirations during the expiratory phase; no overt use of accessary muscles or distress was noted in his breathing pattern. PMV was allowed to remain on as pt consumed po's.   Pt appears to adequately tolerate PMV placement w/out overt, gross respiratory discomfort or distress; ANS remained adequately stable but suspect min change from his baseline(?) - he was at home independent w/ all ADLs per chart  notes/report. Education was given on PMV use/wear, MUST have Cuff deflation for PMV wear, checking and removing the air from the balloon b/f placing, placing/removing the PMV, and care of the PMV. Discussed that is MUST be worn for all eating/drinking; and can be work w/ Therapies(OT, PT). Must NOT be worn when sleeping. Encouraged Rest Breaks at times during the day. Precautions posted at bedside and in chart.  Pt remains w/ a Cuffed trach during his wean at this time. Sticker on Cuff line, and in room. NSG made aware. ST services will continue to monitor for any further needs while admitted.     DYSPHAGIA TREATMENT:  Pt seen for ongoing assessment of swallowing. He is able to wear the PMV comfortably now for verbal communication and for po intake. See session noted above.  NSG reported no noted dysphagia reports last shift. Diet was modified to a Puree diet w/ Nectar liquids yesterday by SLP. Pt explained general aspiration precautions and agreed verbally to the need for following them especially sitting upright for all oral intake, No Talking w/ food and drink in mouth, and wearing PMV for ALL oral intake. Pt assisted w/ sitting up and using multiple pillows behind him for a forward position. He fed himself his Breakfast meal after given Setup support by SLP; nectar liquids, ice chips, and purees. NO overt clinical s/s of aspiration were noted w/ any consistency; No coughing, respiratory status remained relatively calm and overtly unlabored w/ Rest Breaks(as at his Baseline), vocal quality clear b/t trials. O2 sats 97%; HR 117. Pt held Cup when drinking and fed self following instructions for single, small sips slowly given intermittent verbal cues. Straws were utilized for better oral control d/t his positioning.  Oral phase appeared [facility] and improved for bolus management and timely A-P transfer for swallowing -- more Timely oral phase time noted this session today w/ the Pureed consistency foods than w/  minced consistency foods. No swishing and bolus holding seen as at previous session.  Oral clearing achieved w/ all consistencies given Time. Suspect min impact of his baseline Cognitive decline w/ impact of new illness and extended hospitalization. Pt has natural Dentition for mastication of broken solids.    Discussed pt's presentation w/ NSG and RT; noted Baseline of GERD/REFLUX w/ phlegm and expectoration episodes at home. Pt is on a PPI currently.   Recommended continuing a Dysphagia level 1 diet (puree foods) w/ gravies added to moisten foods; Thin liquids. Recommend strict aspiration precautions; Pills Crushed in Puree d/t Cognitive status and for ease of swallowing; tray setup and positioning assistance Upright for meals. PMV MUST BE PLACED FOR ALL ORAL INTAKE. Monitoring w/ all oral intake. ST services will continue to f/u w/ pt for toleration of diet, objective swallow assessment(MBSS) if indicated, and education on precautions while admitted. NSG and MD updated on above. Precautions posted at bedside for PMV wear/use and aspiration precautions.  HPI HPI: 60 year old male with PMHx of laryngeal cancer status post tracheostomy presented to [facility] ED from home via EMS on 10/30/2020 due to his son's concern of altered mental status. 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  Per his son's report he last saw his father well around 1 PM on Monday, 10/30/2020.  His father lives with his wife, for whom he is caretaker, as she has dementia and is very hard of hearing.  When his son returned from work after 8 PM on Monday,  10/30/2020 his father appeared altered, unable to follow commands and unable to speak to him. Per his son his father is ambulatory, alert and oriented x4 and able to care for himself.  Per his son's report patient does have a history of alcoholism but currently only drinks nonalcoholic beer.  He is not aware of any previous stroke history.   CXR - Substantially regressed left lung opacity with mild residual.  No new cardiopulmonary abnormality; CT head - Bilateral basal ganglia lacunar infarcts, age indeterminate but favor chronic -- chronic changes (see all Imaging in chart including 05/2020 incuding CT and PET scans in 2022).  Pt placed on vent support post admit d/t status and was felt to have multifactorial encephalopathy versus diffuse anoxic injury in the setting of possible cardiac event given his troponinemia, and started on heparin drip for concern for ACS.  Per CCU/Pulmonogy note: dx includes Pneumonia with severe hypoxic resp failure with septic shock and acute ischemic cardiomyopathy with NSTEMI with acute renal failure and liver failure and acidosis.  Weaned from vent support on 11/04/2020 and remains on Trach Collar O2 support, tracheostomy.      SLP Plan  Continue with current plan of care (mbs TBD)      Recommendations for follow up therapy are one component of a multi-disciplinary discharge planning process, led by the attending physician.  Recommendations may be updated based on patient status, additional functional criteria and insurance authorization.    Recommendations  Diet recommendations: Dysphagia 1 (puree);Nectar-thick liquid  Liquids provided via: Cup;No straw Medication Administration: Crushed with puree (vs Whole in Puree) Supervision: Patient able to self feed (setup) Compensations: Minimize environmental distractions;Slow rate;Small sips/bites;Lingual sweep for clearance of pocketing;Follow solids with liquid Postural Changes and/or Swallow Maneuvers: Out of bed for  meals;Seated upright 90 degrees;Upright 30-60 min after meal      Patient may use Passy-Muir Speech Valve: During all therapies with supervision;During all waking hours (remove during sleep);During PO intake/meals PMSV Supervision: Intermittent MD: Please consider changing trach tube to :  (not at this time)         General recommendations:  (Dietician f/u) Oral Care Recommendations: Oral care BID;Oral care before and after PO;Patient independent with oral care Follow up Recommendations: Inpatient Rehab (TBD) SLP Visit Diagnosis: Dysphagia, pharyngeal phase (R13.13);Aphonia (R49.1) (tracheostomy) Plan: Continue with current plan of care (mbs TBD)       GO               Orinda Kenner, MS, CCC-SLP Speech Language Pathologist Rehab Services [phone] [facility]  11/07/2020, 11:30 AM

## 2020-11-07 NOTE — Progress Notes (Signed)
Physical Therapy Treatment Patient Details Name: Ronald Murphy MRN: [id number] DOB: 12-Mar-1960 Today's Date: 11/07/2020   History of Present Illness Pt is a 60 y/o M who presented to the ED from home on 10/30/20 due to his son's concern of AMS. Pt was admitted to the ICU on 10/18 & treated for severe transaminitis, possible sepsis, NTSEMI, AKI with severe hyperkalemia, and acute AMS. Pt found to have Tracheal aspirate with Staph aureus. PMH: laryngeal CA s/p tracheostomy, Hep C, alcoholism in remission, anxiety, depression, HTN    PT Comments    Patient agreeable to PT. Patient performed seated level LE strengthening exercises with AAROM. Patient required assistance for bed mobility and declined attempting to stand due to fatigue. No significant increased work of breathing noted with activity. Sp02 99% and heart rate 88bpm with activity. Recommend to continue PT to maximize independence and facilitate return to prior level of function.    Recommendations for follow up therapy are one component of a multi-disciplinary discharge planning process, led by the attending physician.  Recommendations may be updated based on patient status, additional functional criteria and insurance authorization.  Follow Up Recommendations  Acute inpatient rehab (3hours/day)     Assistance Recommended at Discharge Frequent or constant Supervision/Assistance  Equipment Recommendations  Other (comment) (to be determined at next level of care)    Recommendations for Other Services       Precautions / Restrictions Precautions Precautions: Fall Restrictions Weight Bearing Restrictions: No     Mobility  Bed Mobility Overal bed mobility: Needs Assistance Bed Mobility: Sit to Supine     Supine to sit: Max assist;HOB elevated Sit to supine: Mod assist;HOB elevated   General bed mobility comments: assistance for LE and trunk support to sit upright. assistance for BLE support to return to bed. verbal cues  for safety and technique. increased time and effort required    Transfers                   General transfer comment: patient declined attempting to stand due to fatigue    Ambulation/Gait                 Stairs             Wheelchair Mobility    Modified Rankin (Stroke Patients Only)       Balance   Sitting-balance support: Feet supported Sitting balance-Leahy Scale: Fair Sitting balance - Comments: no loss of balance in sitting position                                    Cognition Arousal/Alertness: Awake/alert Behavior During Therapy: Flat affect Overall Cognitive Status: Impaired/Different from baseline                                 General Comments: increased time required for following commands.        Exercises General Exercises - Lower Extremity Ankle Circles/Pumps: AROM;Strengthening;Both;10 reps;Seated Long Arc Quad: AAROM;Strengthening;Both;10 reps;Seated Other Exercises Other Exercises: verbal and visual cues for exercise technique for strengthening LE. Sp02 99% and heart rate 88bpm with activity    General Comments        Pertinent Vitals/Pain Pain Assessment: No/denies pain    Home Living  Prior Function            PT Goals (current goals can now be found in the care plan section) Acute Rehab PT Goals Patient Stated Goal: unable to verbalize PT Goal Formulation: With patient Time For Goal Achievement: 11/18/20 Potential to Achieve Goals: Fair Progress towards PT goals: Progressing toward goals    Frequency    7X/week      PT Plan Current plan remains appropriate    Co-evaluation              AM-PAC PT "6 Clicks" Mobility   Outcome Measure  Help needed turning from your back to your side while in a flat bed without using bedrails?: A Little Help needed moving from lying on your back to sitting on the side of a flat bed without using  bedrails?: A Lot Help needed moving to and from a bed to a chair (including a wheelchair)?: A Lot Help needed standing up from a chair using your arms (e.g., wheelchair or bedside chair)?: A Lot Help needed to walk in hospital room?: Total Help needed climbing 3-5 steps with a railing? : Total 6 Click Score: 11    End of Session Equipment Utilized During Treatment: Oxygen Activity Tolerance: Patient tolerated treatment well Patient left: in bed;with call bell/phone within reach;with bed alarm set Nurse Communication: Mobility status PT Visit Diagnosis: Unsteadiness on feet (R26.81);Muscle weakness (generalized) (M62.81);Difficulty in walking, not elsewhere classified (R26.2)     Time: 5110-2111 PT Time Calculation (min) (ACUTE ONLY): 12 min  Charges:  $Therapeutic Activity: 8-22 mins                     Minna Merritts, PT, MPT    Percell Locus 11/07/2020, 3:42 PM

## 2020-11-07 NOTE — Progress Notes (Signed)
Speech Language Pathology Treatment: Dysphagia;Passy Muir Speaking valve  Patient Details Name: Ronald Murphy MRN: [id number] DOB: 1960/12/09 Today's Date: 11/07/2020 Time: 5852-7782 SLP Time Calculation (min) (ACUTE ONLY): 65 min  Assessment / Plan / Recommendation Clinical Impression  Pt seen for both PMV tx session for toleration of PMV wear/use then toleration of po's/oral diet -- oral diet (dysphagia 1 w/ Nectar liquids) established yesterday. Pt on TC baseline. Pt was alert, required verbal cues for direction and to follow through w/ general instructions/precautions; reduced verbal output - answered directly to basic questions. Awake and alert but passive.   Explained the use and wear of the PMV to pt; trach and stoma area inspected; ensured Cuff was deflated Before placing the PMV. Pt's respiratory effort appeared calm. RR: 21, O2 sats 98%, HR 84-89. FiO2 21% at 5L per chart. Cuff deflated at baseline. Pt verbalized desire for Breakfast meal. Verbalizations and conversation were c/b adequate in volume and intelligibility w/ fair-good breath support/effort, though min gravely. Encouraged pt to focus on his breath support to support his speech/volume. PMV placement was tolerated during sessions w/out noted O2 desaturation, or significant change in RR/HR from his baseline. No overt discomfort noted in his respiratory effort -- pt stated it felt "fine" to wear/talk w/. No increased effort noted in respirations. PMV was allowed to remain on as pt consumed po's.   Pt appears to adequately tolerate PMV placement w/out overt, gross respiratory discomfort or distress; ANS remained adequately stable though unsure if any new changes/decline -- pt was I w/ ADLs prior to admit per chart/report.  Much education was given on PMV use/wear, MUST have Cuff deflation for PMV wear, checking and removing the air from the balloon b/f placing, placing/removing the PMV, and care of the PMV. Discussed that is MUST be  worn for all eating/drinking; and can be work w/ Therapies(OT, PT). Must NOT be worn when sleeping. Encouraged Rest Breaks at times during the day. Precautions posted at bedside and in chart.  Pt remains w/ a Cuffed trach during his wean at this time. Sticker placed on Cuff line, and in room. NSG made aware. ST services will continue to monitor for any further needs while admitted.     DYSPHAGIA TREATMENT:  Pt seen for ongoing assessment of swallowing. He is able to wear the PMV comfortably now for verbal communication and for po intake. See session noted above.  NSG reported no noted dysphagia reports last shift. Diet was modified to a Puree diet w/ Nectar liquids yesterday by SLP at BSE. Pt explained general aspiration precautions and agreed verbally to the need for following them especially sitting upright for all oral intake, No Talking w/ food and drink in mouth, and wearing PMV for ALL oral intake. Pt assisted w/ sitting up and using multiple pillows behind him for a forward position. Pt consumed trials of Nectar liquids and purees. NO overt clinical s/s of aspiration were noted w/ any consistency; No coughing, respiratory status remained relatively calm, vocal quality clear b/t trials. O2 sats 98%; HR 89. Pt held Cup when drinking and fed self following instructions for single, small sips slowly given intermittent verbal cues. Timely A-P transfer for swallowing. Oral clearing achieved w/ all consistencies. Pt is Edentulous; mild UE tremors/shakiness w/ self-feeding.  Discussed pt's presentation w/ NSG.   Recommended continuing a Dysphagia level 1 diet (puree foods) w/ gravies added to moisten foods; Nectar liquids at this time of illness. Recommend aspiration precautions; Pills Crushed in Puree d/t presentation  and for ease of swallowing; tray setup and positioning assistance Upright for meals. PMV MUST BE PLACED FOR ALL ORAL INTAKE. Monitoring w/ w/ wear and oral intake. ST services will continue to  f/u w/ pt for toleration of diet, objective swallow assessment(MBSS) if indicated, and education on precautions while admitted. NSG and MD updated on above. Precautions posted at bedside for PMV wear/use and aspiration precautions.      HPI HPI: 60 year old male with PMHx of laryngeal cancer status post tracheostomy presented to [facility] ED from home via EMS on 10/30/2020 due to his son's concern of altered mental status. 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  Per his son's report he last saw his father well around 1 PM on Monday, 10/30/2020.  His father lives with his wife, for whom he is caretaker, as she has dementia and is very hard of hearing.  When his son returned from work after 8 PM on Monday, 10/30/2020 his father appeared altered, unable to follow commands and unable to speak to him. Per his son his father is ambulatory, alert and oriented x4 and able to care for himself.  Per his son's report patient does have a history of alcoholism but currently only drinks nonalcoholic beer.  He is not aware of any previous stroke history.   CXR - Substantially regressed left lung opacity with mild residual.  No new cardiopulmonary abnormality; CT head - Bilateral basal ganglia lacunar infarcts, age indeterminate but favor chronic -- chronic changes (see all Imaging in chart including 05/2020 incuding CT and PET scans in 2022).  Pt placed on vent support post admit d/t status and was felt to have multifactorial encephalopathy versus diffuse anoxic injury in the setting of possible cardiac event given his troponinemia, and started on  heparin drip for concern for ACS.  Per CCU/Pulmonogy note: dx includes Pneumonia with severe hypoxic resp failure with septic shock and acute ischemic cardiomyopathy with NSTEMI with acute renal failure and liver failure and acidosis.  Weaned from vent support on 11/04/2020 and remains on Trach Collar O2 support, tracheostomy.      SLP Plan  Continue with current plan of care (mbs TBD)      Recommendations for follow up therapy are one component of a multi-disciplinary discharge planning process, led by the attending physician.  Recommendations may be updated based on patient status, additional functional criteria and insurance authorization.    Recommendations  Diet recommendations: Dysphagia 1 (puree);Nectar-thick liquid Liquids provided via: Cup;No straw Medication Administration: Crushed with puree (vs Whole in Puree) Supervision: Patient able to self feed (setup) Compensations: Minimize environmental distractions;Slow rate;Small sips/bites;Lingual sweep for clearance of pocketing;Follow solids with liquid Postural Changes and/or Swallow Maneuvers: Out of bed for meals;Seated upright 90 degrees;Upright 30-60 min after meal      Patient may use Passy-Muir Speech Valve: During all therapies with supervision;During all waking hours (remove during sleep);During PO intake/meals PMSV Supervision: Intermittent MD: Please consider changing trach tube to :  (not at this time)         General recommendations:  (Dietician f/u) Oral Care Recommendations: Oral care BID;Oral care before and after PO;Patient independent with oral care Follow up Recommendations: Inpatient Rehab (TBD) SLP Visit Diagnosis: Dysphagia,  pharyngeal phase (R13.13);Aphonia (R49.1) (tracheostomy) Plan: Continue with current plan of care (mbs TBD)       GO                 Orinda Kenner, MS, CCC-SLP Speech Language Pathologist Rehab Services [phone] [facility]  11/07/2020, 11:42 AM

## 2020-11-07 NOTE — Progress Notes (Signed)
[place] [facility] [place] NAME: Ronald Murphy    MR#:  [id number]  DATE OF BIRTH:  1960-09-02  SUBJECTIVE:   patient very alert and oriented. Able to speak since he has his PM valve. He is on trach collar for humidification of trach. Use it as needed. Patient able to tolerate current diet.  REVIEW OF SYSTEMS:   Review of Systems  Constitutional:  Negative for chills, fever and weight loss.  HENT:  Negative for ear discharge, ear pain and nosebleeds.   Eyes:  Negative for blurred vision, pain and discharge.  Respiratory:  Negative for sputum production, shortness of breath, wheezing and stridor.   Cardiovascular:  Negative for chest pain, palpitations, orthopnea and PND.  Gastrointestinal:  Negative for abdominal pain, diarrhea, nausea and vomiting.  Genitourinary:  Negative for frequency and urgency.  Musculoskeletal:  Negative for back pain and joint pain.  Neurological:  Negative for sensory change, speech change, focal weakness and weakness.  Psychiatric/Behavioral:  Negative for depression and hallucinations. The patient is not nervous/anxious.   Tolerating Diet:ng feeding Tolerating PT: CIR  DRUG ALLERGIES:  No Known Allergies  VITALS:  Blood pressure 108/68, pulse 79, temperature 98 F (36.7 C), temperature source Axillary, resp. rate 18, height 5\' 3"  (1.6 m), weight 73 kg, SpO2 96 %.  PHYSICAL EXAMINATION:   Physical Exam  GENERAL:  60 y.o.-year-old patient lying in the bed with no acute distress. Weekly ill HEENT: Head atraumatic, normocephalic NECK: trach shiley cuffed #6 with PMV+ LUNGS: Normal breath sounds bilaterally, no wheezing, rales, rhonchi. No use of accessory muscles of respiration.  CARDIOVASCULAR: S1, S2 normal. No murmurs, rubs, or gallops.  ABDOMEN: Soft, nontender, nondistended. EXTREMITIES: No cyanosis, clubbing or edema b/l.    NEUROLOGIC: unable to assess due to pt participation PSYCHIATRIC:  patient is  alert   SKIN:  Pressure Injury 10/31/20 Buttocks Left Stage 1 -  Intact skin with non-blanchable redness of a localized area usually over a bony prominence. (Active)  10/31/20 0300  Location: Buttocks  Location Orientation: Left  Staging: Stage 1 -  Intact skin with non-blanchable redness of a localized area usually over a bony prominence.  Wound Description (Comments):   Present on Admission: Yes     Pressure Injury Knee Anterior;Right Stage 1 -  Intact skin with non-blanchable redness of a localized area usually over a bony prominence. (Active)     Location: Knee  Location Orientation: Anterior;Right  Staging: Stage 1 -  Intact skin with non-blanchable redness of a localized area usually over a bony prominence.  Wound Description (Comments):   Present on Admission:      Pressure Injury 11/05/20 Shoulder Left;Posterior Stage 1 -  Intact skin with non-blanchable redness of a localized area usually over a bony prominence. (Active)  11/05/20 0800  Location: Shoulder  Location Orientation: Left;Posterior  Staging: Stage 1 -  Intact skin with non-blanchable redness of a localized area usually over a bony prominence.  Wound Description (Comments):   Present on Admission:          LABORATORY PANEL:  CBC Recent Labs  Lab 11/05/20 0455  WBC 12.2*  HGB 13.4  HCT 39.5  PLT 225     Chemistries  Recent Labs  Lab 11/05/20 0455 11/06/20 0505 11/07/20 0514  NA 147* 150* 145  K 4.1 3.8 2.8*  CL 113* 116* 110  CO2 25 29 27   GLUCOSE 117* 115* 134*  BUN 104* 92* 67*  CREATININE  2.08* 1.52* 1.19  CALCIUM 8.6* 8.8* 8.4*  MG 1.8 2.3  --   AST 94*  --   --   ALT 305*  --   --   ALKPHOS 77  --   --   BILITOT 0.7  --   --     Cardiac Enzymes No results for input(s): TROPONINI in the last 168 hours. RADIOLOGY:  No results found. ASSESSMENT AND PLAN:   Ronald Murphy is a 60 y.o. male with a past medical history significant for T3N2B squamous cell carcinoma of the  supraglottic larynx (s/p completion of radiation treatments in June 2019 with chemotherapy begun but truncated secondary to tolerability, known C1 metastasis s/p CyberKnife radiation treatment and chemotherapy, most recently on durvalumab which was started 02/04/2019 with last infusion on 02/23/2020), hypertension, alcoholism in remission, anxiety/depression, kidney stones presented to [facility] ED from home via EMS on 10/30/2020 due to his son's concern of altered mental status  #NSTEMI --[facility] cards following with plans for conservative medical management.  --Completed 48hrs of heparin gtt. --ASA/statin started.  --Cardiology will consider DAPT prior to discharge--messaged Dr K--ok to start po plavix   #Acute hypoxic/hypercapnic respiratory failure #Septic Shock 2/2 MSSA PNA (resolved) --now weaned entirely from mechanical ventilation --on IV Day cefazolin. Previously completed 2 days of ceftriaxone prior to culture data finalization --Discontinue solumedrol --Continue bronchodilators for now     -- patient currently on trach collar --10/24-- plan to change to passi muir valve --10/25--pt is doing well. Able to talk. Will wean him off the trach collar (used just for humidification)   #Acute metabolic encephalopathy #Bilateral basal ganglia lacunar infarct-new since May 2022 --slowly improving.  --Infarct suspected chronic in nature, due to son's story of acute change in mental status and difficulty with balance.  Stat telemetry neuro consult obtained to look over imaging and receive recommendations as patient is receiving high-dose heparin for N-STEMI. Per telemetry neurologist infarcts appear chronic on imaging -- continue aspirin   #Acute kidney injury 2/2 Rhabdomyolysis Hypernatremia --probable ATN component. CK downtrending. Renal function slowly improving. Continue to monitor and provide supportive care  -- good urine output -- creatinine trending down 3.61--3.81--2.9--2.08--1.5 --D5 water  at 75 cc/hr, increase free h2o flushes-- sodium corrected 145  #Shock liver-- please significantly improve #Chronic hepatitis C --LFTs downtrending.  --Has underlying active HCV by chart review (+ viral load in [facility] system in 2020).    #Pressure injuries of hip/knee/feet #Burn on left ear --supportive care, wound consult, Turn q2hrs   Family communication : spoke with Georgiana Spinner son on the phone Consults :Speech CODE STATUS: full code DVT Prophylaxis : heparin Level of care: Progressive Cardiac Status is: Inpatient  Remains inpatient appropriate because: improving slowly  Anticiapte d/c  to CIR--pt improving and will greatly benefit from [place]: 25 minutes.  >50% time spent on counselling and coordination of care  Note: This dictation was prepared with Dragon dictation along with smaller phrase technology. Any transcriptional errors that result from this process are unintentional.  Fritzi Mandes M.D    [facility]   CC: Primary care physician; Pcp, No Patient ID: JARAE PANAS, male   DOB: 1960/10/11, 60 y.o.   MRN: [id number]

## 2020-11-07 NOTE — Progress Notes (Signed)
Inpatient Rehab Admissions Coordinator:   Will open insurance for prior auth today.   Shann Medal, PT, DPT Admissions Coordinator [phone] 11/07/20  1:16 PM

## 2020-11-07 NOTE — Consult Note (Signed)
Consultation Note Date: 11/07/2020   Patient Name: Ronald Murphy  DOB: 1960-02-21  MRN: [id number]  Age / Sex: 60 y.o., male  PCP: Pcp, No Referring Physician: Fritzi Mandes, MD  Reason for Consultation: Establishing goals of care  HPI/Patient Profile: 60 y.o. 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  Admission work-up reveals pneumonia, severe hypoxic respiratory failure, septic shock, acute ischemic cardiomyopathy, acute renal failure, liver failure, acidosis, and possible NSTEMI.   Palliative medicine team was consulted to discuss goals of care.    Clinical Assessment and Goals of Care: I have reviewed medical records including EPIC notes, labs and imaging, received report from nursing, assessed the patient and then met with patient and his wife at bedside to discuss diagnosis prognosis, GOC, EOL wishes, disposition and options.  I introduced Palliative Medicine as specialized medical care for people living with serious illness. It focuses on providing relief from the symptoms and stress of a serious illness. The goal is to improve quality of life for both the patient and the family.  I shared that palliative medicine is not hospice.  The patient seemed relieved and actually had a bit of a smile when hearing this.  I described that I am an extra layer of support designed to walk alongside him as he makes decisions about his plan of care.  I shared that these decisions can only be made when he has a healthy understanding of what is going on with his care.  I educated him that I am also a resource to help him navigate all aspects of his medical treatment so that he can make decisions and lined with his goals of care.  We discussed a brief life  review of the patient.  The patient has 3 children, [employer], [employer], and [place]  He and his wife live with [name].  His wife has memory issues.  She is not a good historian.  She shares that she loves her husband very much, that they are very close, and that she wants him to come home as soon as possible.  As far as functional and nutritional status prior to admission patient was able to complete all ADLs independently.  He was walking and driving without complication.  He did not have a decreased appetite and no GI issues.  He was functioning at home with his tracheostomy on room air.  We discussed patient's current illness and what it means in the larger context of patient's on-going co-morbidities.  I educated the patient that his biggest challenge is his respiratory system but that he also has other comorbidities that are complicating his current medical status.  He agreed that he has a lot going on, but also said that he is getting better every day.  I attempted to elicit values and goals of care important to the patient.  He said that his health is his biggest concern.  He shares he is worried about his wife and his children, and just wants to get better.  When asked what he understood about his current health situation he shared that he knows his lungs were having a hard time but that he is feeling better every day.  I asked if he had a plan as far as what would happen after this hospitalization and he said he was not sure.  I shared the medical team's current plan to have the patient apply for an be admitted to CIR.  I reviewed that this is an inpatient rehabilitation that involves daily physical therapy and Occupational Therapy. The patient smiled and said that this sounded good to him.  His wife asked when he would be able to come home.  I shared that I wish that this was not his current state but that I am worried that he will not be returning home for at least several weeks.  This seemed to be  news to her though I understand she has memory issues.  Educated both the patient and the wife that he will need rehabilitation in some form of a facility before we can consider transitioning back to being at home.  The patient said he understood this and was willing to put in the work.  Discussed with patient/family the importance of continued conversation with family and the medical providers regarding overall plan of care and treatment options, ensuring decisions are within the context of the patient's values and GOCs.    Questions and concerns were addressed. The family was encouraged to call with questions or concerns.   Primary Decision Maker PATIENT  Code Status/Advance Care Planning: Full code  Prognosis:   Unable to determine  Discharge Planning: [facility] for rehab with Palliative care service follow-up  Primary Diagnoses: Present on Admission:  Chronic hepatitis C without hepatic coma ([place])  Altered mental status   Physical Exam Constitutional:      Appearance: Normal appearance. He is normal weight. He is not toxic-appearing.  HENT:     Head: Normocephalic and atraumatic.     Ears:     Comments: Healing wound of left pinna Cardiovascular:     Rate and Rhythm: Normal rate.     Pulses: Normal pulses.  Abdominal:     Palpations: Abdomen is soft.  Skin:    General: Skin is warm and dry.  Neurological:     Mental Status: He is alert.     Comments: Delayed but appropriate responses  Psychiatric:        Mood and Affect: Mood normal.        Behavior: Behavior normal.        Thought Content: Thought content normal.        Judgment: Judgment normal.    Vital Signs: BP 108/68 (BP Location: Left Arm)   Pulse 79   Temp 98 F (36.7 C) (Axillary)   Resp 18   Ht 5' 3"  (1.6 m)   Wt 73 kg   SpO2 96%   BMI 28.51 kg/m  Pain Scale: 0-10 POSS *See Group Information*: 1-Acceptable,Awake and alert Pain Score: 0-No pain SpO2: SpO2: 96 % O2 Device:SpO2:  96 % O2 Flow Rate: .O2 Flow Rate (L/min): 5 L/min  Palliative Assessment/Data: 60%     I discussed this patient's plan of care with nursing, patient, patient's wife.  Thank you for this consult. Palliative medicine will continue to follow and assist holistically.   Time Total: 70 minutes Greater than 50%  of this time was spent counseling and coordinating care related to the above assessment and plan.  Signed by: Jordan Hawks, DNP, FNP-BC Palliative Medicine    Please contact Palliative Medicine Team phone at [phone] for questions and concerns.  For individual provider: See Shea Evans

## 2020-11-08 DIAGNOSIS — J9621 Acute and chronic respiratory failure with hypoxia: Secondary | ICD-10-CM

## 2020-11-08 DIAGNOSIS — I214 Non-ST elevation (NSTEMI) myocardial infarction: Secondary | ICD-10-CM | POA: Diagnosis not present

## 2020-11-08 DIAGNOSIS — R778 Other specified abnormalities of plasma proteins: Secondary | ICD-10-CM | POA: Diagnosis not present

## 2020-11-08 DIAGNOSIS — N179 Acute kidney failure, unspecified: Secondary | ICD-10-CM | POA: Diagnosis not present

## 2020-11-08 DIAGNOSIS — Z8521 Personal history of malignant neoplasm of larynx: Secondary | ICD-10-CM | POA: Diagnosis not present

## 2020-11-08 LAB — COMPREHENSIVE METABOLIC PANEL
ALT: 58 U/L — ABNORMAL HIGH (ref 0–44)
AST: 46 U/L — ABNORMAL HIGH (ref 15–41)
Albumin: 2.9 g/dL — ABNORMAL LOW (ref 3.5–5.0)
Alkaline Phosphatase: 66 U/L (ref 38–126)
Anion gap: 7 (ref 5–15)
BUN: 44 mg/dL — ABNORMAL HIGH (ref 8–23)
CO2: 26 mmol/L (ref 22–32)
Calcium: 8.5 mg/dL — ABNORMAL LOW (ref 8.9–10.3)
Chloride: 109 mmol/L (ref 98–111)
Creatinine, Ser: 1.09 mg/dL (ref 0.61–1.24)
GFR, Estimated: >60 mL/min (ref >60)
Glucose, Bld: 102 mg/dL — ABNORMAL HIGH (ref 70–99)
Potassium: 3.1 mmol/L — ABNORMAL LOW (ref 3.5–5.1)
Sodium: 142 mmol/L (ref 135–145)
Total Bilirubin: 1 mg/dL (ref 0.3–1.2)
Total Protein: 6.5 g/dL (ref 6.5–8.1)

## 2020-11-08 LAB — CBC WITH DIFFERENTIAL/PLATELET
Abs Immature Granulocytes: 0.12 10*3/uL — ABNORMAL HIGH (ref 0.00–0.07)
Basophils Absolute: 0 10*3/uL (ref 0.0–0.1)
Basophils Relative: 0 %
Eosinophils Absolute: 0.2 10*3/uL (ref 0.0–0.5)
Eosinophils Relative: 1 %
HCT: 38.8 % — ABNORMAL LOW (ref 39.0–52.0)
Hemoglobin: 13.4 g/dL (ref 13.0–17.0)
Immature Granulocytes: 1 %
Lymphocytes Relative: 6 %
Lymphs Abs: 0.7 10*3/uL (ref 0.7–4.0)
MCH: 30.2 pg (ref 26.0–34.0)
MCHC: 34.5 g/dL (ref 30.0–36.0)
MCV: 87.6 fL (ref 80.0–100.0)
Monocytes Absolute: 0.7 10*3/uL (ref 0.1–1.0)
Monocytes Relative: 6 %
Neutro Abs: 10.2 10*3/uL — ABNORMAL HIGH (ref 1.7–7.7)
Neutrophils Relative %: 86 %
Platelets: 216 10*3/uL (ref 150–400)
RBC: 4.43 MIL/uL (ref 4.22–5.81)
RDW: 15.6 % — ABNORMAL HIGH (ref 11.5–15.5)
WBC: 12 10*3/uL — ABNORMAL HIGH (ref 4.0–10.5)
nRBC: 0 % (ref 0.0–0.2)

## 2020-11-08 LAB — MAGNESIUM: Magnesium: 1.5 mg/dL — ABNORMAL LOW (ref 1.7–2.4)

## 2020-11-08 LAB — PHOSPHORUS: Phosphorus: 3.2 mg/dL (ref 2.5–4.6)

## 2020-11-08 MED ORDER — POTASSIUM CHLORIDE 10 MEQ/100ML IV SOLN
10.0000 meq | INTRAVENOUS | Status: AC
Start: 2020-11-08 — End: 2020-11-08
  Administered 2020-11-08 (×3): 10 meq via INTRAVENOUS
  Filled 2020-11-08 (×5): qty 100

## 2020-11-08 MED ORDER — MAGNESIUM SULFATE 2 GM/50ML IV SOLN
2.0000 g | Freq: Once | INTRAVENOUS | Status: AC
  Administered 2020-11-08: 2 g via INTRAVENOUS
  Filled 2020-11-08: qty 50

## 2020-11-08 MED ORDER — POTASSIUM CHLORIDE 10 MEQ/100ML IV SOLN
10.0000 meq | INTRAVENOUS | Status: AC
  Administered 2020-11-08 (×2): 10 meq via INTRAVENOUS
  Filled 2020-11-08 (×2): qty 100

## 2020-11-08 MED ORDER — LOSARTAN POTASSIUM 25 MG PO TABS
25.0000 mg | ORAL_TABLET | Freq: Every day | ORAL | Status: DC
  Administered 2020-11-09 – 2020-11-12 (×4): 25 mg via ORAL
  Filled 2020-11-08 (×4): qty 1

## 2020-11-08 NOTE — Progress Notes (Addendum)
Inpatient Rehab Admissions Coordinator:   I did receive insurance authorization for this patient but I do not have a bed for him to admit to CIR today.  Will continue to follow.   1427: spoke to pt's therapy and medical team.  Note possible worsening of aspiration with plans for MBSS and further discussions with Palliative.  Will follow for ultimate plan.   Shann Medal, PT, DPT Admissions Coordinator [phone] 11/08/20  10:30 AM

## 2020-11-08 NOTE — Progress Notes (Signed)
Physical Therapy Treatment Patient Details Name: Ronald Murphy MRN: [id number] DOB: [birth year] Today's Date: 11/08/2020   History of Present Illness Pt is a 60 y/o M who presented to the ED from home on 10/30/20 due to his son's concern of AMS. Pt was admitted to the ICU on 10/18 & treated for severe transaminitis, possible sepsis, NTSEMI, AKI with severe hyperkalemia, and acute AMS. Pt found to have Tracheal aspirate with Staph aureus. PMH: laryngeal CA s/p tracheostomy, Hep C, alcoholism in remission, anxiety, depression, HTN    PT Comments    Pt was on bed pan upon arriving with RN tech in room. Author assisted RN staff with hygiene care prior to session beginning. He was on 5 L trach collar upon arriving but was placed on 6 L for mobile trach collar. He was able to exit R side of bed, stand, and ambulate with assistance to doorway of room and return. He does endorse severe fatigue but with seated rest quickly recovers. Pt is a great CIR candidate to address deficits while maximizing independence with ADLs.     Recommendations for follow up therapy are one component of a multi-disciplinary discharge planning process, led by the attending physician.  Recommendations may be updated based on patient status, additional functional criteria and insurance authorization.  Follow Up Recommendations  Acute inpatient rehab (3hours/day)     Assistance Recommended at Discharge Frequent or constant Supervision/Assistance  Equipment Recommendations  Other (comment) (defer to next level of care)       Precautions / Restrictions Precautions Precautions: Fall Restrictions Weight Bearing Restrictions: No     Mobility  Bed Mobility Overal bed mobility: Needs Assistance Bed Mobility: Sit to Supine;Supine to Sit     Supine to sit: Min assist;Mod assist;HOB elevated Sit to supine: Mod assist;HOB elevated   General bed mobility comments: pt continues to require assistance to exit/re-enter bed.     Transfers Overall transfer level: Needs assistance Equipment used: Rolling walker (2 wheels) Transfers: Sit to/from Stand Sit to Stand: Min assist;Mod assist;From elevated surface           General transfer comment: min-mod assist to stand from elevated bed height 2 x. Once fatigued, poor eccentric controlled lowering.    Ambulation/Gait Ambulation/Gait assistance: Min assist Gait Distance (Feet): 40 Feet Assistive device: Rolling walker (2 wheels) Gait Pattern/deviations: Trunk flexed;Decreased stride length Gait velocity: decreased   General Gait Details: pt was able to ambulate on trach collar 5 L to doorway of room and return.Does fatuigue quickly and had to remove passy muir to allow improved breathing. desaturated noted however poor pleth due to moving.     Balance Overall balance assessment: Needs assistance Sitting-balance support: Feet supported Sitting balance-Leahy Scale: Good Sitting balance - Comments: sat EOB unsupported without assistance   Standing balance support: Bilateral upper extremity supported;During functional activity Standing balance-Leahy Scale: Fair Standing balance comment: no LOB however is reliant on UE support.        Cognition Arousal/Alertness: Awake/alert Behavior During Therapy: WFL for tasks assessed/performed Overall Cognitive Status: No family/caregiver present to determine baseline cognitive functioning      General Comments: Pt is A and O x 4. Agreeable to session and OOB activity               Pertinent Vitals/Pain Pain Assessment: No/denies pain Faces Pain Scale: No hurt     PT Goals (current goals can now be found in the care plan section) Acute Rehab PT Goals Patient Stated Goal: is  A and oriented x 3. does not have good understanding of full scope of deficits Progress towards PT goals: Progressing toward goals    Frequency    7X/week      PT Plan Current plan remains appropriate    Co-evaluation      PT goals addressed during session: Mobility/safety with mobility;Balance;Proper use of DME;Strengthening/ROM        AM-PAC PT "6 Clicks" Mobility   Outcome Measure  Help needed turning from your back to your side while in a flat bed without using bedrails?: A Little Help needed moving from lying on your back to sitting on the side of a flat bed without using bedrails?: A Lot Help needed moving to and from a bed to a chair (including a wheelchair)?: A Lot Help needed standing up from a chair using your arms (e.g., wheelchair or bedside chair)?: A Lot Help needed to walk in hospital room?: Total Help needed climbing 3-5 steps with a railing? : Total 6 Click Score: 11    End of Session Equipment Utilized During Treatment: Oxygen Activity Tolerance: Patient tolerated treatment well Patient left: in bed;with call bell/phone within reach;with bed alarm set Nurse Communication: Mobility status PT Visit Diagnosis: Unsteadiness on feet (R26.81);Muscle weakness (generalized) (M62.81);Difficulty in walking, not elsewhere classified (R26.2)     Time: 1610-9604 PT Time Calculation (min) (ACUTE ONLY): 31 min  Charges:  $Gait Training: 8-22 mins $Therapeutic Activity: 8-22 mins                     Julaine Fusi PTA 11/08/20, 12:38 PM

## 2020-11-08 NOTE — Progress Notes (Signed)
PROGRESS NOTE    Ronald Murphy  MSX:[id number] DOB: February 04, 1960 DOA: 10/30/2020 PCP: Pcp, No   Brief Narrative:  60 y.o. 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 (s/p completion of radiation treatments in June 2019 with chemotherapy begun but truncated secondary to tolerability, known C1 metastasis s/p CyberKnife radiation treatment and chemotherapy, most recently on durvalumab which was started 02/04/2019 with last infusion on 02/23/2020), hypertension, ETOH in remission, anxiety/depression, kidney stones presented to [facility] ED from home via EMS on 10/30/2020 due to his son's concern of altered mental status  Subjective: Alert nods yes and no to questions appropriately   Assessment & Plan:  Covid vaccination;  Active Problems:   Chronic hepatitis C without hepatic coma (HCC)   Tracheostomy status (HCC)   Acute metabolic encephalopathy   History of laryngeal cancer   Acute renal failure (HCC)   NSTEMI (non-ST elevated myocardial infarction) (HCC)   Small cell carcinoma of lung (HCC)   Radiation-induced esophageal stricture   Chronic, continuous use of opioids   Chronic hyponatremia   Acute hepatitis   Acute respiratory failure (HCC)   Altered mental status   Pressure injury of skin   Protein-calorie malnutrition, severe   Pneumonia due to methicillin susceptible Staphylococcus aureus (MSSA) ([place])   Hypernatremia   #NSTEMI --[place] cards following with plans for conservative medical management.  --Completed 48hrs of heparin gtt. --ASA/statin started.  --Cardiology will consider DAPT prior to discharge--messaged Dr K--ok to start po plavix   #Acute hypoxic/hypercapnic respiratory failure #Septic Shock 2/2 MSSA PNA (resolved) --now weaned entirely from mechanical ventilation --on IV Day cefazolin. Previously completed 2 days of ceftriaxone prior to culture data finalization --Discontinue solumedrol --Continue bronchodilators for now     -- patient  currently on trach collar --10/24-- plan to change to passi muir valve --10/25--pt is doing well. Able to talk. Will wean him off the trach collar (used just for humidification)   #Acute metabolic encephalopathy #Bilateral basal ganglia lacunar infarct-new since May 2022 --slowly improving.  --Infarct suspected chronic in nature, due to son's story of acute change in mental status and difficulty with balance.  Stat telemetry neuro consult obtained to look over imaging and receive recommendations as patient is receiving high-dose heparin for N-STEMI. Per telemetry neurologist infarcts appear chronic on imaging -- continue aspirin   #Acute kidney injury 2/2 Rhabdomyolysis Hypernatremia --probable ATN component. CK downtrending. Renal function slowly improving. Continue to monitor and provide supportive care  -- good urine output -- creatinine trending down 3.61--3.81--2.9--2.08--1.5 --D5 water at 75 cc/hr, increase free h2o flushes-- sodium corrected 145   #Shock liver-- please significantly improve #Chronic hepatitis C --LFTs downtrending.  --Has underlying active HCV by chart review (+ viral load in [facility] system in 2020).    #Pressure injuries of hip/knee/feet #Burn on left ear --supportive care, wound consult, Turn q2hrs  Hypokalemia - Potassium goal> - Potassium IV 50 mEq  Hypomagnesmia - Magnesium goal> 2 - Magnesium IV 2 g   Obese (BMI 28.51kg/m)    DVT prophylaxis: Heparin Code Status: Full Family Communication: 10/26 wife at bedside for discussion of plan of care all questions answered Status is: Inpatient    Dispo: The patient is from: SNF              Anticipated d/c is to: SNF              Anticipated d/c date is: > 3 days  Patient currently is not medically stable to d/c.      Consultants:  Palliative care FNP Jordan Hawks   Procedures/Significant Events:     I have personally reviewed and interpreted all radiology studies and my  findings are as above.  VENTILATOR SETTINGS: 10/26 FiO2 28% SPO2 97%   Cultures   Antimicrobials: Anti-infectives (From admission, onward)    Start     Dose/Rate Route Frequency Ordered Stop   11/06/20 1400  ceFAZolin (ANCEF) IVPB 2g/100 mL premix        2 g 200 mL/hr over 30 Minutes Intravenous Every 8 hours 11/06/20 0742 11/08/20 1244   11/05/20 1400  ceFAZolin (ANCEF) IVPB 1 g/50 mL premix  Status:  Discontinued        1 g 100 mL/hr over 30 Minutes Intravenous Every 8 hours 11/05/20 0735 11/06/20 0742   11/03/20 1100  ceFAZolin (ANCEF) IVPB 1 g/50 mL premix  Status:  Discontinued        1 g 100 mL/hr over 30 Minutes Intravenous Every 12 hours 11/03/20 1006 11/05/20 0735   10/31/20 2200  cefTRIAXone (ROCEPHIN) 1 g in sodium chloride 0.9 % 100 mL IVPB  Status:  Discontinued        1 g 200 mL/hr over 30 Minutes Intravenous Every 24 hours 10/31/20 0258 11/03/20 1006   10/31/20 2200  azithromycin (ZITHROMAX) 500 mg in sodium chloride 0.9 % 250 mL IVPB        500 mg 250 mL/hr over 60 Minutes Intravenous Every 24 hours 10/31/20 0258 11/03/20 2349   10/31/20 0030  cefTRIAXone (ROCEPHIN) 1 g in sodium chloride 0.9 % 100 mL IVPB        1 g 200 mL/hr over 30 Minutes Intravenous  Once 10/31/20 0029 10/31/20 0248   10/31/20 0030  azithromycin (ZITHROMAX) 500 mg in sodium chloride 0.9 % 250 mL IVPB        500 mg 250 mL/hr over 60 Minutes Intravenous  Once 10/31/20 0029 10/31/20 0248         Devices    LINES / TUBES:  Shiley 6 mm cuffed 10/31/2020>>>    Continuous Infusions:  sodium chloride      ceFAZolin (ANCEF) IV 2 g (11/08/20 0122)     Objective: Vitals:   11/08/20 0020 11/08/20 0100 11/08/20 0318 11/08/20 0749  BP:  (!) 156/100  (!) 146/90  Pulse:  78  91  Resp:      Temp:    98.4 F (36.9 C)  TempSrc:    Oral  SpO2: 99%  99%   Weight:      Height:        Intake/Output Summary (Last 24 hours) at 11/08/2020 1610 Last data filed at 11/08/2020 0600 Gross  per 24 hour  Intake 480 ml  Output 2151 ml  Net -1671 ml   Filed Weights   11/04/20 0500 11/05/20 0500 11/07/20 0600  Weight: 78.8 kg 76 kg 73 kg    Examination:  General: Alert, nods yes and no to questions positive acute on chronic respiratory distress (chronic trach), cachectic Eyes: negative scleral hemorrhage, negative anisocoria, negative icterus ENT: Negative Runny nose, negative gingival bleeding, Neck:  Negative scars, masses, torticollis, lymphadenopathy, JVD, #6 cuffed Shiley in place, whitish discharge Lungs: coarse breath sounds bilaterally without wheezes or crackles Cardiovascular: Regular rate and rhythm without murmur gallop or rub normal S1 and S2 Abdomen: negative abdominal pain, nondistended, positive soft, bowel sounds, no rebound, no ascites, no appreciable mass Extremities: No significant  cyanosis, clubbing, or edema bilateral lower extremities Skin: Negative rashes, lesions, ulcers Psychiatric: Unable to evaluate secondary to chronic trach Central nervous system:  Cranial nerves II through XII intact, tongue/uvula midline, all extremities muscle strength 5/5, sensation intact throughout, unable to evaluate secondary to chronic trach .     Data Reviewed: Care during the described time interval was provided by me .  I have reviewed this patient's available data, including medical history, events of note, physical examination, and all test results as part of my evaluation.   CBC: Recent Labs  Lab 11/02/20 0820 11/03/20 0452 11/04/20 0436 11/05/20 0455  WBC 12.8* 10.8* 11.8* 12.2*  NEUTROABS 11.7* 9.4* 9.9* 9.7*  HGB 13.2 11.3* 11.4* 13.4  HCT 39.0 33.6* 33.1* 39.5  MCV 87.1 86.6 87.3 89.0  PLT 208 206 227 932   Basic Metabolic Panel: Recent Labs  Lab 11/02/20 0820 11/03/20 0452 11/04/20 0436 11/05/20 0455 11/06/20 0505 11/07/20 0514  NA 136 140 142 147* 150* 145  K 4.4 4.6 4.3 4.1 3.8 2.8*  CL 101 108 112* 113* 116* 110  CO2 _0 GLUCOSE 131* 123* 121* 117* 115* 134*  BUN 86* 99* 86* 104* 92* 67*  CREATININE 3.96* 3.73* 2.98* 2.08* 1.52* 1.19  CALCIUM 8.3* 8.1* 8.2* 8.6* 8.8* 8.4*  MG 2.5* 2.4 2.2 1.8 2.3  --   PHOS 3.8 2.8 2.5 2.9  --   --    GFR: Estimated Creatinine Clearance: 58.4 mL/min (by C-G formula based on SCr of 1.19 mg/dL). Liver Function Tests: Recent Labs  Lab 11/02/20 0820 11/03/20 0452 11/04/20 0436 11/05/20 0455  AST 711* 263* 113* 94*  ALT 1,460* 854* 533* 305*  ALKPHOS 100 77 75 77  BILITOT 0.6 0.5 0.7 0.7  PROT 6.5 5.6* 6.0* 6.3*  ALBUMIN 2.6* 2.3* 2.3* 2.5*   No results for input(s): LIPASE, AMYLASE in the last 168 hours. No results for input(s): AMMONIA in the last 168 hours. Coagulation Profile: No results for input(s): INR, PROTIME in the last 168 hours. Cardiac Enzymes: Recent Labs  Lab 11/02/20 0820 11/03/20 0452 11/04/20 0436 11/05/20 0455  CKTOTAL 2,866* 1,588* 703* 583*   BNP (last 3 results) No results for input(s): PROBNP in the last 8760 hours. HbA1C: No results for input(s): HGBA1C in the last 72 hours. CBG: Recent Labs  Lab 11/05/20 1616 11/05/20 2330 11/06/20 0304 11/06/20 0739 11/06/20 1236  GLUCAP 123* 130* 110* 113* 152*   Lipid Profile: No results for input(s): CHOL, HDL, LDLCALC, TRIG, CHOLHDL, LDLDIRECT in the last 72 hours. Thyroid Function Tests: No results for input(s): TSH, T4TOTAL, FREET4, T3FREE, THYROIDAB in the last 72 hours. Anemia Panel: No results for input(s): VITAMINB12, FOLATE, FERRITIN, TIBC, IRON, RETICCTPCT in the last 72 hours. Urine analysis:    Component Value Date/Time   COLORURINE AMBER (A) 10/30/2020 0006   APPEARANCEUR CLOUDY (A) 10/30/2020 0006   LABSPEC 1.014 10/30/2020 0006   PHURINE 5.0 10/30/2020 0006   GLUCOSEU 50 (A) 10/30/2020 0006   HGBUR LARGE (A) 10/30/2020 0006   BILIRUBINUR NEGATIVE 10/30/2020 0006   KETONESUR 5 (A) 10/30/2020 0006   PROTEINUR 100 (A) 10/30/2020 0006   NITRITE NEGATIVE  10/30/2020 0006   LEUKOCYTESUR NEGATIVE 10/30/2020 0006   Sepsis Labs: _1 (procalcitonin:4,lacticidven:4)  ) Recent Results (from the past 240 hour(s))  Resp Panel by RT-PCR (Flu A&B, Covid) Nasopharyngeal Swab     Status: None   Collection Time: 10/30/20 10:11 PM   Specimen: Nasopharyngeal Swab; Nasopharyngeal(NP) swabs  in vial transport medium  Result Value Ref Range Status   SARS Coronavirus 2 by RT PCR NEGATIVE NEGATIVE Final    Comment: (NOTE) SARS-CoV-2 target nucleic acids are NOT DETECTED.  The SARS-CoV-2 RNA is generally detectable in upper respiratory specimens during the acute phase of infection. The lowest concentration of SARS-CoV-2 viral copies this assay can detect is 138 copies/mL. A negative result does not preclude SARS-Cov-2 infection and should not be used as the sole basis for treatment or other patient management decisions. A negative result may occur with  improper specimen collection/handling, submission of specimen other than nasopharyngeal swab, presence of viral mutation(s) within the areas targeted by this assay, and inadequate number of viral copies(<138 copies/mL). A negative result must be combined with clinical observations, patient history, and epidemiological information. The expected result is Negative.  Fact Sheet for Patients:  EntrepreneurPulse.com.au  Fact Sheet for Healthcare Providers:  IncredibleEmployment.be  This test is no t yet approved or cleared by the [place] FDA and  has been authorized for detection and/or diagnosis of SARS-CoV-2 by FDA under an Emergency Use Authorization (EUA). This EUA will remain  in effect (meaning this test can be used) for the duration of the COVID-19 declaration under Section 564(b)(1) of the Act, 21 U.S.C.section 360bbb-3(b)(1), unless the authorization is terminated  or revoked sooner.       Influenza A by PCR NEGATIVE NEGATIVE Final   Influenza B  by PCR NEGATIVE NEGATIVE Final    Comment: (NOTE) The Xpert Xpress SARS-CoV-2/FLU/RSV plus assay is intended as an aid in the diagnosis of influenza from Nasopharyngeal swab specimens and should not be used as a sole basis for treatment. Nasal washings and aspirates are unacceptable for Xpert Xpress SARS-CoV-2/FLU/RSV testing.  Fact Sheet for Patients: EntrepreneurPulse.com.au  Fact Sheet for Healthcare Providers: IncredibleEmployment.be  This test is not yet approved or cleared by the [place] FDA and has been authorized for detection and/or diagnosis of SARS-CoV-2 by FDA under an Emergency Use Authorization (EUA). This EUA will remain in effect (meaning this test can be used) for the duration of the COVID-19 declaration under Section 564(b)(1) of the Act, 21 U.S.C. section 360bbb-3(b)(1), unless the authorization is terminated or revoked.  Performed at [facility], [address] [place], [place] [address]   Culture, blood (routine x 2)     Status: None   Collection Time: 10/31/20 12:06 AM   Specimen: BLOOD  Result Value Ref Range Status   Specimen Description BLOOD RIGHT ARM  Final   Special Requests   Final    BOTTLES DRAWN AEROBIC AND ANAEROBIC Blood Culture adequate volume   Culture   Final    NO GROWTH 5 DAYS Performed at [facility], [address] [place], [place] [address]    Report Status 11/05/2020 FINAL  Final  Culture, blood (routine x 2)     Status: None   Collection Time: 10/31/20 12:06 AM   Specimen: BLOOD  Result Value Ref Range Status   Specimen Description BLOOD LEFT ARM  Final   Special Requests   Final    BOTTLES DRAWN AEROBIC AND ANAEROBIC Blood Culture results may not be optimal due to an excessive volume of blood received in culture bottles   Culture   Final    NO GROWTH 5 DAYS Performed at [facility], [address] [place], [place] [address]    Report Status  11/05/2020 FINAL  Final  Urine Culture     Status: None  Collection Time: 10/31/20 12:06 AM   Specimen: Urine, Random  Result Value Ref Range Status   Specimen Description   Final    URINE, RANDOM Performed at [facility], [address], [place], [place] [address]    Special Requests   Final    NONE Performed at [facility], [address] [place], [place] [address]    Culture   Final    NO GROWTH Performed at [facility], [place] [address], [place], [place] [address]    Report Status 10/31/2020 FINAL  Final  MRSA Next Gen by PCR, Nasal     Status: None   Collection Time: 10/31/20  4:41 AM   Specimen: Nasal Mucosa; Nasal Swab  Result Value Ref Range Status   MRSA by PCR Next Gen NOT DETECTED NOT DETECTED Final    Comment: (NOTE) The GeneXpert MRSA Assay (FDA approved for NASAL specimens only), is one component of a comprehensive MRSA colonization surveillance program. It is not intended to diagnose MRSA infection nor to guide or monitor treatment for MRSA infections. Test performance is not FDA approved in patients less than 8 years old. Performed at [facility], [address] [place], [place] [address]   Culture, Respiratory w Gram Stain     Status: None   Collection Time: 10/31/20  4:11 PM   Specimen: Tracheal Aspirate; Respiratory  Result Value Ref Range Status   Specimen Description   Final    TRACHEAL ASPIRATE Performed at [facility], [address] [place], [place] [address]    Special Requests   Final    NONE Performed at [facility], [address] [place], [place] [address]    Gram Stain   Final    ABUNDANT WBC PRESENT, PREDOMINANTLY PMN ABUNDANT GRAM POSITIVE COCCI RARE YEAST WITH PSEUDOHYPHAE RARE GRAM POSITIVE RODS Performed at [facility], [place] [address] [place],  [address]    Culture ABUNDANT STAPHYLOCOCCUS AUREUS  Final   Report Status 11/03/2020 FINAL  Final    Organism ID, Bacteria STAPHYLOCOCCUS AUREUS  Final      Susceptibility   Staphylococcus aureus - MIC*    CIPROFLOXACIN <=0.5 SENSITIVE Sensitive     ERYTHROMYCIN <=0.25 SENSITIVE Sensitive     GENTAMICIN <=0.5 SENSITIVE Sensitive     OXACILLIN 0.5 SENSITIVE Sensitive     TETRACYCLINE <=1 SENSITIVE Sensitive     VANCOMYCIN <=0.5 SENSITIVE Sensitive     TRIMETH/SULFA <=10 SENSITIVE Sensitive     CLINDAMYCIN <=0.25 SENSITIVE Sensitive     RIFAMPIN <=0.5 SENSITIVE Sensitive     Inducible Clindamycin NEGATIVE Sensitive     * ABUNDANT STAPHYLOCOCCUS AUREUS         Radiology Studies: No results found.      Scheduled Meds:  aspirin  81 mg Per Tube Daily   chlorhexidine gluconate (MEDLINE KIT)  15 mL Mouth Rinse BID   Chlorhexidine Gluconate Cloth  6 each Topical Daily   escitalopram  20 mg Per Tube Daily   heparin injection (subcutaneous)  5,000 Units Subcutaneous Q8H   hydrALAZINE  50 mg Per Tube Q8H   levothyroxine  50 mcg Per Tube Daily   mouth rinse  15 mL Mouth Rinse 10 times per day   metoprolol tartrate  25 mg Per Tube BID   pantoprazole (PROTONIX) IV  40 mg Intravenous QHS   Continuous Infusions:  sodium chloride      ceFAZolin (ANCEF) IV 2 g (11/08/20 0122)  LOS: 8 days   The patient is critically ill with multiple organ systems failure and requires high complexity decision making for assessment and support, frequent evaluation and titration of therapies, application of advanced monitoring technologies and extensive interpretation of multiple databases. Critical Care Time devoted to patient care services described in this note  Time spent: 40 minutes     Mose Colaizzi, Geraldo Docker, MD [facility]   If 7PM-7AM, please contact night-coverage 11/08/2020, 8:14 AM

## 2020-11-08 NOTE — Progress Notes (Signed)
OT Cancellation Note  Patient Details Name: Ronald Murphy MRN: [id number] DOB: 07/20/1960   Cancelled Treatment:    Reason Eval/Treat Not Completed: Other (comment). Chart reviewed. Upon arrival RN at bedside placing IV. Pt and supportive family member at bedside report eager to work with therapy this afternoon, will return as able.   Dessie Coma, M.S. OTR/L  11/08/20, 1:48 PM  ascom [phone]

## 2020-11-08 NOTE — Progress Notes (Addendum)
                                                     Palliative Care Progress Note, Assessment & Plan   Patient Name: Ronald Murphy       Date: 11/08/2020 DOB: 10-19-1960  Age: 60 y.o. MRN#: [id number] Attending Physician: Allie Bossier, MD Primary Care Physician: Pcp, No Admit Date: 10/30/2020  Reason for Consultation/Follow-up: Establishing goals of care  Physical Exam HENT:     Head: Normocephalic and atraumatic.     Mouth/Throat:     Mouth: Mucous membranes are moist.     Comments: Trach present, trach collar has scant brown to clear thick secretions Pulmonary:     Effort: Pulmonary effort is normal.  Abdominal:     Palpations: Abdomen is soft.  Skin:    General: Skin is warm and dry.  Neurological:     Mental Status: He is alert. Mental status is at baseline.  Psychiatric:        Mood and Affect: Mood normal.        Thought Content: Thought content normal.        Judgment: Judgment normal.            I reviewed medical records, assessed the patient and then meet at the patient's bedside along with SLP therapist Belenda Cruise and attending Dr. Sherral Hammers.  We reviewed the patient's newfound increased secretions and possibility of increased aspiration risk.  Patient is scheduled for barium swallow test tomorrow.    We reviewed with the patient that if patient is aspirating we will likely need to have further discussions regarding artificial nutrition and hydration.  Patient has had a PEG tube in the past.  He gives no indication of whether or not he would want this again.    Patient lives with wife who has dementia.  He also lives with son Ronald Murphy. who recently moved in with him. Ronald Murphy. works part-time.  It is unclear what kind of support patient will have at home.  Hopefully will have these answers in a better plan of care knowing what the  patient's goals are tomorrow afternoon.  In terms of patient's comorbidities he has not had any chemo or radiation treatment since January 2022 for his laryngeal cancer.  His plan was for inpatient rehab however we will review all disposition options tomorrow with patient after swallow study results post. Patient is in agreement with this plan and confirmed he would like to know options with Ronald Murphy. Present.  I spoke with patient's son Ronald Murphy. who plans to be at the hospital tomorrow.  I scheduled a meeting time of 2 PM with hopes that we will have results of barium swallow study at this time.  Family meeting tomorrow, 10/27, at 2 PM.  Curt Bears L. Ilsa Iha, FNP-BC Palliative Medicine Team Team Phone # [phone]  Total time: 15 minutes   Greater than 50%  of this time was spent counseling and coordinating care related to the above assessment and plan.

## 2020-11-08 NOTE — Progress Notes (Signed)
Occupational Therapy Treatment Patient Details Name: Ronald Murphy MRN: [id number] DOB: 05/02/1960 Today's Date: 11/08/2020   History of present illness Pt is a 60 y/o M who presented to the ED from home on 10/30/20 due to his son's concern of AMS. Pt was admitted to the ICU on 10/18 & treated for severe transaminitis, possible sepsis, NTSEMI, AKI with severe hyperkalemia, and acute AMS. Pt found to have Tracheal aspirate with Staph aureus. PMH: laryngeal CA s/p tracheostomy, Hep C, alcoholism in remission, anxiety, depression, HTN   OT comments  Ronald Murphy seen for OT treatment on this date. Upon arrival to room pt awake/alert. Pt seated upright in bed with family present. Pt agreeable to tx.  Pt and family instructed in d/c recs. Pt MIN A x2 for sit<>stand and side steps at EOB. Pt tolerated ~5 min static sitting EOB in preparation for seated ADLs. Pt tolerated ~1 min standing, requires BUE support - unable to progress to standing grooming. Pt reporting 5/10 fatigue after sup>sit and 10/10 fatigue after sit<>stand. Pt making good progress toward goals. Pt continues to benefit from skilled OT services to maximize return to PLOF and minimize risk of future falls, injury, caregiver burden, and readmission. Will continue to follow POC. Discharge recommendation remains appropriate.     Recommendations for follow up therapy are one component of a multi-disciplinary discharge planning process, led by the attending physician.  Recommendations may be updated based on patient status, additional functional criteria and insurance authorization.    Follow Up Recommendations  Acute inpatient rehab (3hours/day)    Assistance Recommended at Discharge Frequent or constant Supervision/Assistance  Equipment Recommendations  Other (comment) (Defer to next venue of care)    Recommendations for Other Services      Precautions / Restrictions Precautions Precautions: Fall Precaution Comments: chronic trach on  5L O2 Restrictions Weight Bearing Restrictions: No       Mobility Bed Mobility Overal bed mobility: Needs Assistance Bed Mobility: Sit to Supine;Supine to Sit     Supine to sit: Min assist Sit to supine: Min guard       Transfers Overall transfer level: Needs assistance Equipment used:  Transfers: Sit to/from Stand Sit to Stand: +2 physical assistance;Min assist                Balance Overall balance assessment: Needs assistance Sitting-balance support: Feet supported Sitting balance-Leahy Scale: Good     Standing balance support: During functional activity;Bilateral upper extremity supported Standing balance-Leahy Scale: Fair Standing balance comment: Used +2 therapist support for side stepping along bed length                           ADL either performed or assessed with clinical judgement   ADL Overall ADL's : Needs assistance/impaired                                       General ADL Comments: Pt tolerated ~5 min static sitting EOB in preparation for seated ADLs. Pt tolerated ~1 min standing, requires BUE support - unable to progress to standing grooming.      Cognition Arousal/Alertness: Awake/alert Behavior During Therapy: WFL for tasks assessed/performed Overall Cognitive Status:  General Comments:           Exercises Exercises: Other exercises Other Exercises Other Exercises: Pt and family educ re: OT role, d/c recs, importance of mvmt for functional mobility Other Exercises: Pt sup<>sit, sit<>stand, 3 side steps   Shoulder Instructions       General Comments      Pertinent Vitals/ Pain       Pain Assessment: No/denies pain  Home Living                                          Prior Functioning/Environment              Frequency  Min 3X/week        Progress Toward Goals  OT Goals(current goals can now be found in the care  plan section)  Progress towards OT goals: Progressing toward goals  Acute Rehab OT Goals Patient Stated Goal: get better OT Goal Formulation: With patient/family Time For Goal Achievement: 11/18/20 Potential to Achieve Goals: Good ADL Goals Pt Will Perform Grooming: with modified independence;sitting Pt Will Perform Lower Body Dressing: sitting/lateral leans Pt Will Transfer to Toilet: with modified independence  Plan Discharge plan remains appropriate;Frequency remains appropriate    Co-evaluation                AM-PAC OT "6 Clicks" Daily Activity     Outcome Measure   Help from another person eating meals?: A Little Help from another person taking care of personal grooming?: A Little Help from another person toileting, which includes using toliet, bedpan, or urinal?: A Lot Help from another person bathing (including washing, rinsing, drying)?: A Lot Help from another person to put on and taking off regular upper body clothing?: A Lot Help from another person to put on and taking off regular lower body clothing?: A Lot 6 Click Score: 14    End of Session    OT Visit Diagnosis: Unsteadiness on feet (R26.81);Other abnormalities of gait and mobility (R26.89);Muscle weakness (generalized) (M62.81)   Activity Tolerance Patient tolerated treatment well;Patient limited by fatigue   Patient Left in bed;with nursing/sitter in room;with family/visitor present   Nurse Communication          Time: 0093-8182 OT Time Calculation (min): 18 min  Charges: OT General Charges $OT Visit: 1 Visit OT Treatments $Self Care/Home Management : 8-22 mins  Nino Glow, Markus Daft 11/08/2020, 4:31 PM

## 2020-11-08 NOTE — Progress Notes (Signed)
Speech Language Pathology Treatment: Dysphagia  Patient Details Name: Ronald Murphy MRN: [id number] DOB: [birth year] Today's Date: 11/08/2020 Time: 1250-1330 SLP Time Calculation (min) (ACUTE ONLY): 40 min  Assessment / Plan / Recommendation Clinical Impression  Pt seen as he was finishing some of his Lunch meal -- noted he was eating the Magic Cup and pureed fruit, but had not touched the Pureed foods on the plate nor the Nectar liquid opened beside it (minus ~1 oz). Pt had his PMV placed and was verbal; answered general questions re: his meal and how he was eating. He often nodded he needed to "eat more" but gave no reason why he was not eating his current diet(suspect Puree foods). Dietician reported was Malnourished at Baseline at admit.  Pt was noted to have increased secretions at the stoma/gauze and in O2 tubing; both wet and dry. Per check, HR 93, RR 24-28, O2 sats 93%. These were altered/declined from previous session notes. Pt's vocal quality was clear and fairly strong w/ 1-3 words. Noted overall min increased respiratory effort w/ exertion.  Due to not eating much foods, pt was offered Nepro(cold) supplement drink w/ he consumed 3-4 sips via cup. In noting the secretions again at the stoma, further wetness was noted but not phlegmy or thick. Unsure if secretions were the Nepro aspirated. Pt's vocal quality was clear w/ phonation; no further change/decline in ANS.  In light of concern of aspiration d/t overall presentation and present trials, recommendation was made for NPO status w/ f/u w/ MBSS tomorrow to objectively assess swallow function and safety of oral intake. Full discussion was had w/ MD/Palliative care following; all agreed. Updated pt who agreed. ST services will f/u tomorrow.  Recommend frequent oral care for hygiene and stimulation of swallowing; aspiration precautions.      HPI HPI: 60 year old male with PMHx of laryngeal cancer status post tracheostomy presented to [facility]  ED from home via EMS on 10/30/2020 due to his son's concern of altered mental status. 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  Per his son's report he last saw his father well around 1 PM on Monday, 10/30/2020.  His father lives with his wife, for whom he is caretaker, as she has dementia and is very hard of hearing.  When his son returned from work after 8 PM on Monday, 10/30/2020 his father appeared altered, unable to follow commands and unable to speak to him. Per his son his father is ambulatory, alert and oriented x4 and able to care for himself.  Per his son's report patient does have a history of alcoholism but currently only drinks nonalcoholic beer.  He is not aware of any previous stroke history.   CXR - Substantially regressed left lung opacity with mild residual.  No new cardiopulmonary abnormality; CT head - Bilateral basal ganglia lacunar infarcts, age indeterminate but favor chronic -- chronic changes (see all Imaging in chart including 05/2020 incuding CT and PET scans in 2022).  Pt placed on vent support post admit d/t status and was felt to have multifactorial encephalopathy versus diffuse anoxic injury in the setting of possible cardiac event given his troponinemia, and started on heparin drip for concern for ACS.  Per CCU/Pulmonogy note:  dx includes Pneumonia with severe hypoxic resp failure with septic shock and acute ischemic cardiomyopathy with NSTEMI with acute renal failure and liver failure and acidosis.  Weaned from vent support on 11/04/2020 and remains on Trach Collar O2 support, tracheostomy.      SLP  Plan  MBS;New goals to be determined pending instrumental study      Recommendations for follow up therapy are one component of a multi-disciplinary discharge planning process, led by the attending physician.  Recommendations may be updated based on patient status, additional functional criteria and insurance authorization.    Recommendations  Diet recommendations: NPO Medication Administration: Via alternative means                General recommendations:  (Palliative Care following) Oral Care Recommendations: Oral care QID;Patient independent with oral care;Staff/trained caregiver to provide oral care (support) Follow up Recommendations: Inpatient Rehab (TBD) SLP Visit Diagnosis: Dysphagia, pharyngeal phase (R13.13) (Tracheostomy) Plan: MBS;New goals to be determined pending instrumental study       [place], [place], [place] Pathologist Rehab Services [phone] [facility]  11/08/2020, 5:21 PM

## 2020-11-09 ENCOUNTER — Inpatient Hospital Stay: Payer: Medicare HMO

## 2020-11-09 DIAGNOSIS — I214 Non-ST elevation (NSTEMI) myocardial infarction: Secondary | ICD-10-CM | POA: Diagnosis not present

## 2020-11-09 DIAGNOSIS — Z8521 Personal history of malignant neoplasm of larynx: Secondary | ICD-10-CM | POA: Diagnosis not present

## 2020-11-09 DIAGNOSIS — J9601 Acute respiratory failure with hypoxia: Secondary | ICD-10-CM | POA: Diagnosis not present

## 2020-11-09 DIAGNOSIS — R4182 Altered mental status, unspecified: Secondary | ICD-10-CM | POA: Diagnosis not present

## 2020-11-09 LAB — CBC WITH DIFFERENTIAL/PLATELET
Abs Immature Granulocytes: 0.07 10*3/uL (ref 0.00–0.07)
Basophils Absolute: 0 10*3/uL (ref 0.0–0.1)
Basophils Relative: 0 %
Eosinophils Absolute: 0.1 10*3/uL (ref 0.0–0.5)
Eosinophils Relative: 1 %
HCT: 35.9 % — ABNORMAL LOW (ref 39.0–52.0)
Hemoglobin: 12 g/dL — ABNORMAL LOW (ref 13.0–17.0)
Immature Granulocytes: 1 %
Lymphocytes Relative: 6 %
Lymphs Abs: 0.6 10*3/uL — ABNORMAL LOW (ref 0.7–4.0)
MCH: 29.5 pg (ref 26.0–34.0)
MCHC: 33.4 g/dL (ref 30.0–36.0)
MCV: 88.2 fL (ref 80.0–100.0)
Monocytes Absolute: 0.4 10*3/uL (ref 0.1–1.0)
Monocytes Relative: 4 %
Neutro Abs: 9.1 10*3/uL — ABNORMAL HIGH (ref 1.7–7.7)
Neutrophils Relative %: 88 %
Platelets: 179 10*3/uL (ref 150–400)
RBC: 4.07 MIL/uL — ABNORMAL LOW (ref 4.22–5.81)
RDW: 15.5 % (ref 11.5–15.5)
WBC: 10.3 10*3/uL (ref 4.0–10.5)
nRBC: 0 % (ref 0.0–0.2)

## 2020-11-09 LAB — COMPREHENSIVE METABOLIC PANEL
ALT: 40 U/L (ref 0–44)
AST: 36 U/L (ref 15–41)
Albumin: 2.3 g/dL — ABNORMAL LOW (ref 3.5–5.0)
Alkaline Phosphatase: 55 U/L (ref 38–126)
Anion gap: 9 (ref 5–15)
BUN: 34 mg/dL — ABNORMAL HIGH (ref 8–23)
CO2: 24 mmol/L (ref 22–32)
Calcium: 8.4 mg/dL — ABNORMAL LOW (ref 8.9–10.3)
Chloride: 110 mmol/L (ref 98–111)
Creatinine, Ser: 1.08 mg/dL (ref 0.61–1.24)
GFR, Estimated: >60 mL/min (ref >60)
Glucose, Bld: 92 mg/dL (ref 70–99)
Potassium: 3.5 mmol/L (ref 3.5–5.1)
Sodium: 143 mmol/L (ref 135–145)
Total Bilirubin: 0.8 mg/dL (ref 0.3–1.2)
Total Protein: 5.9 g/dL — ABNORMAL LOW (ref 6.5–8.1)

## 2020-11-09 LAB — PHOSPHORUS: Phosphorus: 2.7 mg/dL (ref 2.5–4.6)

## 2020-11-09 LAB — MAGNESIUM: Magnesium: 1.6 mg/dL — ABNORMAL LOW (ref 1.7–2.4)

## 2020-11-09 MED ORDER — POTASSIUM CHLORIDE 10 MEQ/100ML IV SOLN
10.0000 meq | INTRAVENOUS | Status: AC
  Administered 2020-11-09 (×2): 10 meq via INTRAVENOUS
  Filled 2020-11-09 (×6): qty 100

## 2020-11-09 MED ORDER — POTASSIUM CHLORIDE 10 MEQ/100ML IV SOLN
10.0000 meq | INTRAVENOUS | Status: AC
Start: 2020-11-09 — End: 2020-11-10
  Administered 2020-11-09 – 2020-11-10 (×4): 10 meq via INTRAVENOUS
  Filled 2020-11-09 (×4): qty 100

## 2020-11-09 MED ORDER — MAGNESIUM SULFATE 4 GM/100ML IV SOLN
4.0000 g | Freq: Once | INTRAVENOUS | Status: AC
  Administered 2020-11-09: 4 g via INTRAVENOUS
  Filled 2020-11-09: qty 100

## 2020-11-09 NOTE — Progress Notes (Addendum)
Speech Language Pathology Treatment: Dysphagia  Patient Details Name: Ronald Murphy MRN: [id number] DOB: 1960-10-09 Today's Date: 11/09/2020 Time: 1030-1100 SLP Time Calculation (min) (ACUTE ONLY): 30 min  Assessment / Plan / Recommendation Clinical Impression  Met w/ pt today post the MBSS. Discussed the results and recommendations of the MBSS including the swallowing strategy of Chin Tuck w/ ALL swallows of liquids and foods. Pt was able to demonstrate follow through w/ this during this session. Posted instruction for strategy in the room w/ other precautions -- pt was able to see it ahead of him as a visual cue.  MBSS revealed: "overall moderate-severe pharyngeal and pharyngoesophageal dysphagia, with worsening function compared with MBS performed in Jan 2020 at [facility]. Oral stage is grossly functional, given extended time for mastication as pt is edentulous. Pharyngeal phase is characterized by reduced base of tongue retraction, significantly reduced hyolaryngeal excursion, and reduced pharyngeal constriction. Epiglottic inversion is incomplete, with minimal movement, and poor closure of the laryngeal vestibule. There is penetration during the swallow with all liquid consistencies, and frank, silent aspiration of thin during and after the swallow. Pt was able to clear shallow penetration of nectar, honey with cued cough. Chin tuck reduced amount of penetration with nectar and facilitated improved stripping of bolus from the pharynx with other consistencies.... Given chronic nature of dysphagia and probable impacts of radiation fibrosis; gains are likely to be small and gradual, with focus on symptom management. ". Discussed recommendations for Dysphagia diet and Crushed pills in puree w/ pt. Encouraged pt to talk w/ Family and Palliative Care at today's meeting re: this further and the implication of needing to remain on a Dysphagia diet for the foreseeable future. ST services will recommend Dysphagia  tx/Rehab at Discharge. Also discussed concern of pt not being able to meet his full nutrition/hydration needs d/t the Dysphagia and the Dysphagia diet. Pt agreed to discuss these topics at the meeting today.  ST services will follow pt's status while admitted for any further education; instruction on follow through w/ aspiration precautions and swallowing strategies. Handouts on diet and precautions given; supplies of thickened liquids and ordering information given. MD and NSG updated.      HPI HPI: 60 year old male with PMHx of laryngeal cancer status post tracheostomy presented to [facility] ED from home via EMS on 10/30/2020 due to his son's concern of altered mental status. 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  Per his son's report he last saw his father well around 1 PM on Monday, 10/30/2020.  His father lives with his wife, for whom he is caretaker, as she has dementia and is very hard of hearing.  When his son returned from work after 8 PM on Monday, 10/30/2020 his father appeared altered, unable to follow commands and unable to speak to him. Per his son his father is ambulatory, alert and oriented x4 and able to care for himself.  Per his son's report patient does have a history of alcoholism but currently only drinks nonalcoholic beer.  He is not aware of any previous stroke history.   CXR - Substantially regressed left lung opacity with mild residual.  No new cardiopulmonary abnormality; CT head - Bilateral basal ganglia lacunar infarcts,  age indeterminate but favor chronic -- chronic changes (see all Imaging in chart  including 05/2020 incuding CT and PET scans in 2022).  Pt placed on vent support post admit d/t status and was felt to have multifactorial encephalopathy versus diffuse anoxic injury in the setting of possible cardiac event given his troponinemia, and started on heparin drip for concern for ACS.  Per CCU/Pulmonogy note: dx includes Pneumonia with severe hypoxic resp failure with septic shock and acute ischemic cardiomyopathy with NSTEMI with acute renal failure and liver failure and acidosis.  Weaned from vent support on 11/04/2020 and remains on Trach Collar O2 support, tracheostomy.  MBSS completed 11/09/2020 w/ Dysphagia+, aspiration+. See eval note.      SLP Plan  Continue with current plan of care      Recommendations for follow up therapy are one component of a multi-disciplinary discharge planning process, led by the attending physician.  Recommendations may be updated based on patient status, additional functional criteria and insurance authorization.    Recommendations  Diet recommendations: Dysphagia 1 (puree);Nectar-thick liquid Liquids provided via: Cup;Straw Medication Administration: Crushed with puree Supervision: Patient able to self feed;Intermittent supervision to cue for compensatory strategies Compensations: Minimize environmental distractions;Slow rate;Small sips/bites;Lingual sweep for clearance of pocketing;Multiple dry swallows after each bite/sip;Follow solids with liquid;Clear throat intermittently;Hard cough after swallow;Chin tuck;Use straw to facilitate chin tuck Postural Changes and/or Swallow Maneuvers: Out of bed for meals;Seated upright 90 degrees;Upright 30-60 min after meal;Chin tuck      Patient may use Passy-Muir Speech Valve: During all therapies with supervision;During all waking hours (remove during sleep);During PO intake/meals PMSV Supervision: Intermittent MD: Please consider changing trach tube to :  (n/a)         General recommendations:   (Palliative following; Dietician following) Oral Care Recommendations: Oral care BID;Oral care before and after PO;Staff/trained caregiver to provide oral care;Patient independent with oral care (support) Follow up Recommendations:  (TBD) SLP Visit Diagnosis: Dysphagia, pharyngeal phase (R13.13) (tracheostomy) Plan: Continue with current plan of care       GO                  Orinda Kenner, [place], [place] Pathologist Rehab Services [phone] [facility]  11/09/2020, 4:51 PM

## 2020-11-09 NOTE — Progress Notes (Signed)
Palliative Care Progress Note, Assessment & Plan   Patient Name: Ronald Murphy       Date: 11/09/2020 DOB: [birth year]  Age: 60 y.o. MRN#: [id number] Attending Physician: Allie Bossier, MD Primary Care Physician: Pcp, No Admit Date: 10/30/2020  Reason for Consultation/Follow-up: Establishing goals of care  Subjective: In no apparent distress with trach collar in place.  Patient's wife and patient's son Larnce Schnackenberg are at bedside.  Patient has no complaints of pain.  HPI: 60 y.o. 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  Admission work-up reveals pneumonia, severe hypoxic respiratory failure, septic shock, acute ischemic cardiomyopathy, acute renal failure, liver failure, acidosis, and possible NSTEMI.    Palliative medicine team was consulted to discuss goals of care.    Plan of Care: I have reviewed medical records including EPIC notes, labs and imaging, received report from SLP therapist and bedside nurse, assessed the patient and then met with patient, patient's son Aboubacar Matsuo, and patient's wife at bedside to discuss diagnosis prognosis, [place], EOL wishes, disposition and options.  We discussed patient's current illness and what it means in the larger context of patient's on-going co-morbidities.  Patient believes he is free of laryngeal and lung cancer.   Patient's had just completed a barium swallow study that showed high risk for aspiration.  Patient shared his understanding of the test to be that as long as he tucked his chin with every swallow that he would be fine.  I shared that there is still a high risk of aspiration and  subsequently aspiration pneumonia no matter his best efforts to swallow appropriately.    I educated the patient and the family that even with his best efforts to swallow food and liquids that he will likely not be able to meet his body's demands for hydration and nutrition.  We reviewed that the next step would be to get a peg tube.  I conveyed the risks and benefits of a PEG tube.  The patient recalls having a PEG tube for approximately 1 year.  He says he does not want to have a PEG tube unless that is the only option left for him.  I attempted to elicit values and goals of care important to the patient.  He shared he just wants to ive his normal life, pay his bills, and be home.  The difference between aggressive medical intervention and comfort care was considered in light of the patient's goals of care.  Home with hospice services was described in full detail.  Being admitted to CIR for intense rehab for 2 weeks with a plan to then go home was also reviewed.  While this is his short-term goal to go to rehab I asked what his long-term goal would be after that. Patient shared he would really like to just go home and be comfortable.  He shared he does not want to have to come back to the hospital if he does not have to.  He said home with hospice sounded good.  However, he wanted some time to let our discussion  sink in.  Discussed with patient/family the importance of continued conversation with family and the medical providers regarding overall plan of care and treatment options, ensuring decisions are within the context of the patient's values and GOCs.    I will return tomorrow and ask what decisions if any have been made.  Questions and concerns were addressed. The family was encouraged to call with questions or concerns.   Code Status: Full code  Prognosis:  Unable to determine  Discharge Planning: To Be Determined  Recommendations/Plan: Full code Treat the treatable PT considering  CIR vs home withhospice   Care plan was discussed with Dr. Sherral Hammers, nursing, patient, patient's son, patient's wife  Length of Stay: 9  Physical Exam Constitutional:      Appearance: Normal appearance.  HENT:     Head: Normocephalic and atraumatic.     Mouth/Throat:     Mouth: Mucous membranes are moist.  Cardiovascular:     Rate and Rhythm: Normal rate.     Pulses: Normal pulses.  Pulmonary:     Effort: Pulmonary effort is normal.  Abdominal:     Palpations: Abdomen is soft.  Skin:    General: Skin is warm and dry.  Neurological:     Mental Status: He is alert. Mental status is at baseline.  Psychiatric:        Behavior: Behavior normal.        Thought Content: Thought content normal.        Judgment: Judgment normal.            Vital Signs: BP (!) 143/81 (BP Location: Right Arm)   Pulse 74   Temp 98.2 F (36.8 C) (Oral)   Resp 20   Ht 5' 3"  (1.6 m)   Wt 73 kg   SpO2 96%   BMI 28.51 kg/m  SpO2: SpO2: 96 % O2 Device: O2 Device: Tracheostomy Collar O2 Flow Rate: O2 Flow Rate (L/min): 5 L/min      Palliative Assessment/Data: 60%       Total Time 25 minutes Prolonged Time Billed  no   Greater than 50%  of this time was spent counseling and coordinating care related to the above assessment and plan.  Thank you for allowing the Palliative Medicine Team to assist in the care of this patient.  [place] Ilsa Iha, FNP-BC Palliative Medicine Team Team Phone # [phone]

## 2020-11-09 NOTE — Progress Notes (Signed)
Objective Swallowing Evaluation: Type of Study: MBS-Modified Barium Swallow Study   Patient Details  Name: Ronald Murphy MRN: [id number] Date of Birth: [birth year]  Today's Date: 11/09/2020 Time: SLP Start Time (ACUTE ONLY): 0845 -SLP Stop Time (ACUTE ONLY): 0900  SLP Time Calculation (min) (ACUTE ONLY): 15 min   Past Medical History:  Past Medical History:  Diagnosis Date   Alcoholism in remission ([place])    Anxiety    Depression    Hepatitis C    Hypertension    Kidney stone    Larynx cancer ([place]) 04/05/2017   Stage 3   Past Surgical History:  Past Surgical History:  Procedure Laterality Date   SPINE SURGERY  332   HPI: 60 year old male with PMHx of laryngeal cancer status post tracheostomy presented to [facility] ED from home via EMS on 10/30/2020 due to his son's concern of altered mental status. 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  Per his son's report he last saw his father well around 1 PM on Monday, 10/30/2020.  His father lives with his wife, for whom he is caretaker, as she has dementia and is very hard of hearing.  When his son returned from work after 8 PM on Monday, 10/30/2020 his father appeared altered, unable to follow commands and unable to speak to him. Per his son his father is ambulatory, alert and oriented x4 and able to care for himself.  Per his son's report patient does have a history of alcoholism but currently only drinks nonalcoholic beer.  He is not aware of any previous stroke history.   CXR - Substantially regressed left lung opacity with mild residual.  No new cardiopulmonary  abnormality; CT head - Bilateral basal ganglia lacunar infarcts, age indeterminate but favor chronic -- chronic changes (see all Imaging in chart including 05/2020 incuding CT and PET scans in 2022).  Pt placed on vent support post admit d/t status and was felt to have multifactorial encephalopathy versus diffuse anoxic injury in the setting of possible cardiac event given his troponinemia, and started on heparin drip for concern for ACS.  Per CCU/Pulmonogy note: dx includes Pneumonia with severe hypoxic resp failure with septic shock and acute ischemic cardiomyopathy with NSTEMI with acute renal failure and liver failure and acidosis.  Weaned from vent support on 11/04/2020 and remains on Trach Collar O2 support, tracheostomy.   Subjective: Arrives without PMV, initially    Assessment / Plan / Recommendation  CHL IP CLINICAL IMPRESSIONS 11/09/2020  Clinical Impression Patient presents with overall moderate-severe pharyngeal and pharyngoesophageal dysphagia, with worsening function compared with MBS performed in Jan 2020 at [facility]. Oral stage is grossly functional, given extended time for mastication as pt is edentulous. Pharyngeal phase is characterized by reduced base of tongue retraction, significantly reduced hyolaryngeal excursion, and reduced pharyngeal constriction. Epiglottic inversion is incomplete, with minimal movement, and poor closure of the laryngeal vestibule. There is penetration during the swallow with all liquid consistencies, and frank, silent aspiration of thin during and after the swallow. Pt was able to clear shallow penetration of nectar, honey with cued cough. Chin tuck reduced amount of penetration with nectar and facilitated improved stripping of bolus from the pharynx with other consistencies. Given weakness  of pressure-driving mechanisms, there is moderate residue, primarily in the valleculae, with solids. Prominent posterior upper esophageal tissue, as well as reduced  amplitude/duration of opening of the pharyngoesophageal segment contribute to slow clearance as well as mild retention with some backflow in the cervical esophagus, more notable with thicker consistencies. Given chronic nature of dysphagia and probable impacts of radiation fibrosis; gains are likely to be small and gradual, with focus on symptom management. In context of acute illness/decompensation, recommend continuing dysphagia 1 (puree) and nectar thick liquids with strict precautions/compensations, including: upright for all PO intake; chin tuck with all swallows. Hard cough after liquids to clear penetration. Double swallow for solids, follow with sip of liquid. Slow rate, small bites and sips. Consider upgraded solid texture trials at bedside to determine tolerance / assess for fatigue over the course of a meal. Consider further assessment of esophageal function/ GI follow-up as an outpatient.  SLP Visit Diagnosis Dysphagia, pharyngeal phase (R13.13);Dysphagia, pharyngoesophageal phase (R13.14)  Attention and concentration deficit following --  Frontal lobe and executive function deficit following --  Impact on safety and function Mild aspiration risk;Moderate aspiration risk      CHL IP TREATMENT RECOMMENDATION 11/09/2020  Treatment Recommendations Therapy as outlined in treatment plan below     Prognosis 11/09/2020  Prognosis for Safe Diet Advancement Fair  Barriers to Reach Goals Time post onset;Severity of deficits  Barriers/Prognosis Comment s/p laryngeal Ca and txs; tracheostomy    CHL IP DIET RECOMMENDATION 11/09/2020  SLP Diet Recommendations Dysphagia 1 (Puree) solids;Nectar thick liquid  Liquid Administration via Cup  Medication Administration Crushed with puree  Compensations Minimize environmental distractions;Slow rate;Small sips/bites;Lingual sweep for clearance of pocketing;Follow solids with liquid;Multiple dry swallows after each bite/sip;Hard cough after swallow   Postural Changes Seated upright at 90 degrees;Remain semi-upright after after feeds/meals (Comment)      CHL IP OTHER RECOMMENDATIONS 11/09/2020  Recommended Consults Consider esophageal assessment;Consider GI evaluation  Oral Care Recommendations Oral care QID  Other Recommendations Order thickener from pharmacy;Prohibited food (jello, ice cream, thin soups);Remove water pitcher;Have oral suction available      CHL IP FOLLOW UP RECOMMENDATIONS 11/09/2020  Follow up Recommendations (No Data)      CHL IP FREQUENCY AND DURATION 11/09/2020  Speech Therapy Frequency (ACUTE ONLY) min 3x week  Treatment Duration 2 weeks           CHL IP ORAL PHASE 11/09/2020  Oral Phase WFL  Oral - Pudding Teaspoon --  Oral - Pudding Cup --  Oral - Honey Teaspoon --  Oral - Honey Cup --  Oral - Nectar Teaspoon --  Oral - Nectar Cup --  Oral - Nectar Straw --  Oral - Thin Teaspoon --  Oral - Thin Cup --  Oral - Thin Straw --  Oral - Puree --  Oral - Mech Soft --  Oral - Regular --  Oral - Multi-Consistency --  Oral - Pill --  Oral Phase - Comment --    CHL IP PHARYNGEAL PHASE 11/09/2020  Pharyngeal Phase Impaired  Pharyngeal- Pudding Teaspoon --  Pharyngeal --  Pharyngeal- Pudding Cup --  Pharyngeal --  Pharyngeal- Honey Teaspoon Reduced pharyngeal peristalsis;Reduced epiglottic inversion;Reduced anterior laryngeal mobility;Reduced laryngeal elevation;Reduced airway/laryngeal closure;Reduced tongue base retraction;Penetration/Aspiration during swallow;Pharyngeal residue - valleculae;Delayed swallow initiation-vallecula;Pharyngeal residue - cp segment  Pharyngeal Material enters airway, remains ABOVE vocal cords and not ejected out  Pharyngeal- Honey Cup --  Pharyngeal --  Pharyngeal- Nectar Teaspoon Delayed swallow initiation-vallecula;Reduced pharyngeal peristalsis;Reduced epiglottic inversion;Reduced anterior laryngeal  mobility;Reduced laryngeal elevation;Reduced airway/laryngeal  closure;Reduced tongue base retraction;Penetration/Aspiration during swallow;Pharyngeal residue - valleculae;Pharyngeal residue - cp segment  Pharyngeal Material does not enter airway;Material enters airway, remains ABOVE vocal cords and not ejected out  Pharyngeal- Nectar Cup Delayed swallow initiation-vallecula;Reduced pharyngeal peristalsis;Reduced epiglottic inversion;Reduced anterior laryngeal mobility;Reduced laryngeal elevation;Reduced airway/laryngeal closure;Reduced tongue base retraction;Penetration/Aspiration during swallow;Pharyngeal residue - cp segment  Pharyngeal Material does not enter airway;Material enters airway, remains ABOVE vocal cords and not ejected out  Pharyngeal- Nectar Straw --  Pharyngeal --  Pharyngeal- Thin Teaspoon Delayed swallow initiation-vallecula;Reduced pharyngeal peristalsis;Reduced epiglottic inversion;Reduced anterior laryngeal mobility;Reduced laryngeal elevation;Reduced airway/laryngeal closure;Reduced tongue base retraction;Penetration/Aspiration during swallow;Penetration/Apiration after swallow;Trace aspiration;Pharyngeal residue - valleculae;Pharyngeal residue - pyriform;Pharyngeal residue - cp segment  Pharyngeal Material enters airway, remains ABOVE vocal cords and not ejected out;Material enters airway, CONTACTS cords and not ejected out  Pharyngeal- Thin Cup Delayed swallow initiation-vallecula;Reduced pharyngeal peristalsis;Reduced epiglottic inversion;Reduced anterior laryngeal mobility;Reduced laryngeal elevation;Reduced airway/laryngeal closure;Reduced tongue base retraction;Penetration/Aspiration during swallow;Penetration/Apiration after swallow;Significant aspiration (Amount);Pharyngeal residue - valleculae;Pharyngeal residue - pyriform;Pharyngeal residue - cp segment  Pharyngeal Material enters airway, passes BELOW cords without attempt by patient to eject out (silent aspiration)  Pharyngeal- Thin Straw --  Pharyngeal --  Pharyngeal- Puree  Reduced pharyngeal peristalsis;Reduced epiglottic inversion;Reduced anterior laryngeal mobility;Reduced laryngeal elevation;Reduced airway/laryngeal closure;Reduced tongue base retraction;Pharyngeal residue - valleculae;Pharyngeal residue - cp segment  Pharyngeal Material does not enter airway  Pharyngeal- Mechanical Soft Reduced pharyngeal peristalsis;Reduced anterior laryngeal mobility;Reduced epiglottic inversion;Reduced laryngeal elevation;Reduced airway/laryngeal closure;Reduced tongue base retraction;Pharyngeal residue - valleculae;Pharyngeal residue - cp segment  Pharyngeal --  Pharyngeal- Regular --  Pharyngeal --  Pharyngeal- Multi-consistency --  Pharyngeal --  Pharyngeal- Pill --  Pharyngeal --  Pharyngeal Comment --     CHL IP CERVICAL ESOPHAGEAL PHASE 11/09/2020  Cervical Esophageal Phase Impaired  Pudding Teaspoon --  Pudding Cup --  Honey Teaspoon Reduced cricopharyngeal relaxation;Prominent cricopharyngeal segment;Esophageal backflow into cervical esophagus  Honey Cup --  Nectar Teaspoon Reduced cricopharyngeal relaxation;Prominent cricopharyngeal segment;Esophageal backflow into cervical esophagus  Nectar Cup Reduced cricopharyngeal relaxation;Prominent cricopharyngeal segment;Esophageal backflow into cervical esophagus  Nectar Straw --  Thin Teaspoon Reduced cricopharyngeal relaxation;Prominent cricopharyngeal segment;Esophageal backflow into cervical esophagus  Thin Cup WFL;Reduced cricopharyngeal relaxation;Prominent cricopharyngeal segment  Thin Straw --  Puree Reduced cricopharyngeal relaxation;Prominent cricopharyngeal segment;Esophageal backflow into cervical esophagus  Mechanical Soft Reduced cricopharyngeal relaxation;Prominent cricopharyngeal segment;Esophageal backflow into cervical esophagus  Regular --  Multi-consistency --  Pill --  Cervical Esophageal Comment --    Deneise Lever, MS, CCC-SLP Speech-Language Pathologist  Aliene Altes 11/09/2020,  3:15 PM

## 2020-11-09 NOTE — Progress Notes (Signed)
PROGRESS NOTE    Ronald Murphy  OIN:[id number] DOB: 04/16/1960 DOA: 10/30/2020 PCP: Pcp, No   Brief Narrative:  59 y.o. 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   Assessment & Plan:  Covid vaccination;  Active Problems:   Chronic hepatitis C without hepatic coma (HCC)   Tracheostomy status (HCC)   Acute metabolic encephalopathy   History of laryngeal cancer   Acute renal failure (HCC)   NSTEMI (non-ST elevated myocardial infarction) (HCC)   Small cell carcinoma of lung (HCC)   Radiation-induced esophageal stricture   Chronic, continuous use of opioids   Chronic hyponatremia   Acute hepatitis   Acute respiratory failure (HCC)   Altered mental status   Pressure injury of skin   Protein-calorie malnutrition, severe   Pneumonia due to methicillin susceptible Staphylococcus aureus (MSSA) ([place])   Hypernatremia   #NSTEMI --[place] cards following with plans for conservative medical management.  --Completed 48hrs of heparin gtt. --ASA/statin started.  --Cardiology will consider DAPT prior to discharge--messaged Dr K--ok to start po plavix   Acute hypoxic/hypercapnic respiratory failure acute on chronic respiratory failure with hypoxia/hypercapnia -Trach dependent - Currently Shiley 6 mm cuffed 10/31/2020>>> -- patient currently on trach collar --10/24-- plan to change to passi muir valve --10/25--pt is doing well. Able to talk. Will wean him  off the trach collar (used just for humidification)  #Septic Shock 2/2 positive MSSA PNA (resolved) --now weaned entirely from mechanical ventilation --on IV Day cefazolin. Previously completed 2 days of ceftriaxone prior to culture data finalization --Discontinue solumedrol --Continue bronchodilators for now        #Acute metabolic encephalopathy -Multifactorial: Most likely secondary to acute respiratory failure, septic shock, MSSA pneumonia -Underlying causes treated.  Has resolved  #Bilateral basal ganglia lacunar infarct-new since May 2022 --Stat telemetry neuro consult obtained to look over imaging and receive recommendations as patient is receiving high-dose heparin for N-STEMI. Per telemetry neurologist infarcts appear chronic on imaging -- continue aspirin   #Acute kidney injury 2/2 Rhabdomyolysis Lab Results  Component Value Date   CREATININE 0.92 11/10/2020   CREATININE 1.08 11/09/2020   CREATININE 1.09 11/08/2020   CREATININE 1.19 11/07/2020   CREATININE 1.52 (H) 11/06/2020  -Resolved  Hypernatremia Lab Results  Component Value Date   NA 141 11/10/2020   NA 143 11/09/2020   NA 142 11/08/2020   NA 145 11/07/2020   NA 150 (H) 11/06/2020  -Resolved   #Shock liver/Chronic hepatitis C --LFTs downtrending.  --Has underlying active HCV by chart review (+ viral load in [facility] in 2020).  -LFTs resolved   #Pressure injuries of hip/knee/feet/Burn on left ear --supportive care, wound consult, Turn q2hrs  Hypokalemia - Potassium goal> - 10/27 potassium IV 60 mEq   Hypomagnesmia - Magnesium goal> 2 - 10/27 magnesium IV 4 g  Obese (BMI 28.51kg/m)    DVT prophylaxis: Heparin Code Status: Full Family Communication: 10/27 wife at bedside for discussion of plan of care all questions answered Status is: Inpatient    Dispo: The patient is from: SNF  Anticipated d/c is to: SNF              Anticipated d/c date is: > 3 days              Patient  currently is not medically stable to d/c.      Consultants:  Palliative care FNP Jordan Hawks   Procedures/Significant Events:     I have personally reviewed and interpreted all radiology studies and my findings are as above.  VENTILATOR SETTINGS: 10/26 FiO2 28% SPO2 97%   Cultures   Antimicrobials: Anti-infectives (From admission, onward)    Start     Dose/Rate Route Frequency Ordered Stop   11/06/20 1400  ceFAZolin (ANCEF) IVPB 2g/100 mL premix        2 g 200 mL/hr over 30 Minutes Intravenous Every 8 hours 11/06/20 0742 11/08/20 1244   11/05/20 1400  ceFAZolin (ANCEF) IVPB 1 g/50 mL premix  Status:  Discontinued        1 g 100 mL/hr over 30 Minutes Intravenous Every 8 hours 11/05/20 0735 11/06/20 0742   11/03/20 1100  ceFAZolin (ANCEF) IVPB 1 g/50 mL premix  Status:  Discontinued        1 g 100 mL/hr over 30 Minutes Intravenous Every 12 hours 11/03/20 1006 11/05/20 0735   10/31/20 2200  cefTRIAXone (ROCEPHIN) 1 g in sodium chloride 0.9 % 100 mL IVPB  Status:  Discontinued        1 g 200 mL/hr over 30 Minutes Intravenous Every 24 hours 10/31/20 0258 11/03/20 1006   10/31/20 2200  azithromycin (ZITHROMAX) 500 mg in sodium chloride 0.9 % 250 mL IVPB        500 mg 250 mL/hr over 60 Minutes Intravenous Every 24 hours 10/31/20 0258 11/03/20 2349   10/31/20 0030  cefTRIAXone (ROCEPHIN) 1 g in sodium chloride 0.9 % 100 mL IVPB        1 g 200 mL/hr over 30 Minutes Intravenous  Once 10/31/20 0029 10/31/20 0248   10/31/20 0030  azithromycin (ZITHROMAX) 500 mg in sodium chloride 0.9 % 250 mL IVPB        500 mg 250 mL/hr over 60 Minutes Intravenous  Once 10/31/20 0029 10/31/20 0248         Devices    LINES / TUBES:  Shiley 6 mm cuffed 10/31/2020>>>    Continuous Infusions:  sodium chloride 250 mL (11/08/20 1902)     Objective: Vitals:   11/09/20 0326 11/09/20 0737 11/09/20 0800 11/09/20 1143  BP:  (!) 143/82 (!) 145/93 (!) 155/101  Pulse:  85 87   Resp:   _0 Temp:  98.3 F (36.8 C)  98.2 F (36.8 C)  TempSrc:  Oral  Oral  SpO2: 97% 97% 94% 98%  Weight:      Height:        Intake/Output Summary (Last 24 hours) at 11/09/2020 1510 Last data filed at 11/09/2020 1149 Gross per 24 hour  Intake 278.43 ml  Output 800 ml  Net -521.57 ml    Filed Weights   11/04/20 0500 11/05/20 0500 11/07/20 0600  Weight: 78.8 kg 76 kg 73 kg    Examination:  General: Alert, nods yes and no to questions positive acute on chronic respiratory distress (chronic trach), cachectic Eyes: negative scleral hemorrhage, negative anisocoria, negative icterus ENT: Negative Runny nose, negative gingival bleeding, Neck:  Negative scars, masses, torticollis, lymphadenopathy, JVD, #6 cuffed Shiley in place, whitish discharge Lungs: coarse breath sounds bilaterally without wheezes  or crackles Cardiovascular: Regular rate and rhythm without murmur gallop or rub normal S1 and S2 Abdomen: negative abdominal pain, nondistended, positive soft, bowel sounds, no rebound, no ascites, no appreciable mass Extremities: No significant cyanosis, clubbing, or edema bilateral lower extremities Skin: Negative rashes, lesions, ulcers Psychiatric: Unable to evaluate secondary to chronic trach Central nervous system:  Cranial nerves II through XII intact, tongue/uvula midline, all extremities muscle strength 5/5, sensation intact throughout, unable to evaluate secondary to chronic trach .     Data Reviewed: Care during the described time interval was provided by me .  I have reviewed this patient's available data, including medical history, events of note, physical examination, and all test results as part of my evaluation.   CBC: Recent Labs  Lab 11/03/20 0452 11/04/20 0436 11/05/20 0455 11/08/20 0847 11/09/20 0444  WBC 10.8* 11.8* 12.2* 12.0* 10.3  NEUTROABS 9.4* 9.9* 9.7* 10.2* 9.1*  HGB 11.3* 11.4* 13.4 13.4 12.0*  HCT 33.6* 33.1* 39.5 38.8* 35.9*  MCV 86.6 87.3  89.0 87.6 88.2  PLT 206 227 225 216 254    Basic Metabolic Panel: Recent Labs  Lab 11/03/20 0452 11/04/20 0436 11/05/20 0455 11/06/20 0505 11/07/20 0514 11/08/20 0847 11/09/20 0444  NA 140 142 147* 150* 145 142 143  K 4.6 4.3 4.1 3.8 2.8* 3.1* 3.5  CL 108 112* 113* 116* 110 109 110  CO2 _0 GLUCOSE 123* 121* 117* 115* 134* 102* 92  BUN 99* 86* 104* 92* 67* 44* 34*  CREATININE 3.73* 2.98* 2.08* 1.52* 1.19 1.09 1.08  CALCIUM 8.1* 8.2* 8.6* 8.8* 8.4* 8.5* 8.4*  MG 2.4 2.2 1.8 2.3  --  1.5* 1.6*  PHOS 2.8 2.5 2.9  --   --  3.2 2.7    GFR: Estimated Creatinine Clearance: 64.3 mL/min (by C-G formula based on SCr of 1.08 mg/dL). Liver Function Tests: Recent Labs  Lab 11/03/20 0452 11/04/20 0436 11/05/20 0455 11/08/20 0847 11/09/20 0444  AST 263* 113* 94* 46* 36  ALT 854* 533* 305* 58* 40  ALKPHOS 77 75 77 66 55  BILITOT 0.5 0.7 0.7 1.0 0.8  PROT 5.6* 6.0* 6.3* 6.5 5.9*  ALBUMIN 2.3* 2.3* 2.5* 2.9* 2.3*    No results for input(s): LIPASE, AMYLASE in the last 168 hours. No results for input(s): AMMONIA in the last 168 hours. Coagulation Profile: No results for input(s): INR, PROTIME in the last 168 hours. Cardiac Enzymes: Recent Labs  Lab 11/03/20 0452 11/04/20 0436 11/05/20 0455  CKTOTAL 1,588* 703* 583*    BNP (last 3 results) No results for input(s): PROBNP in the last 8760 hours. HbA1C: No results for input(s): HGBA1C in the last 72 hours. CBG: Recent Labs  Lab 11/05/20 1616 11/05/20 2330 11/06/20 0304 11/06/20 0739 11/06/20 1236  GLUCAP 123* 130* 110* 113* 152*    Lipid Profile: No results for input(s): CHOL, HDL, LDLCALC, TRIG, CHOLHDL, LDLDIRECT in the last 72 hours. Thyroid Function Tests: No results for input(s): TSH, T4TOTAL, FREET4, T3FREE, THYROIDAB in the last 72 hours. Anemia Panel: No results for input(s): VITAMINB12, FOLATE, FERRITIN, TIBC, IRON, RETICCTPCT in the last 72 hours. Urine analysis:    Component Value  Date/Time   COLORURINE AMBER (A) 10/30/2020 0006   APPEARANCEUR CLOUDY (A) 10/30/2020 0006   LABSPEC 1.014 10/30/2020 0006   PHURINE 5.0 10/30/2020 0006   GLUCOSEU 50 (A) 10/30/2020 0006   HGBUR LARGE (A) 10/30/2020 0006   BILIRUBINUR NEGATIVE 10/30/2020 0006   KETONESUR 5 (A)  10/30/2020 0006   PROTEINUR 100 (A) 10/30/2020 0006   NITRITE NEGATIVE 10/30/2020 0006   LEUKOCYTESUR NEGATIVE 10/30/2020 0006   Sepsis Labs: _0 (procalcitonin:4,lacticidven:4)  ) Recent Results (from the past 240 hour(s))  Resp Panel by RT-PCR (Flu A&B, Covid) Nasopharyngeal Swab     Status: None   Collection Time: 10/30/20 10:11 PM   Specimen: Nasopharyngeal Swab; Nasopharyngeal(NP) swabs in vial transport medium  Result Value Ref Range Status   SARS Coronavirus 2 by RT PCR NEGATIVE NEGATIVE Final    Comment: (NOTE) SARS-CoV-2 target nucleic acids are NOT DETECTED.  The SARS-CoV-2 RNA is generally detectable in upper respiratory specimens during the acute phase of infection. The lowest concentration of SARS-CoV-2 viral copies this assay can detect is 138 copies/mL. A negative result does not preclude SARS-Cov-2 infection and should not be used as the sole basis for treatment or other patient management decisions. A negative result may occur with  improper specimen collection/handling, submission of specimen other than nasopharyngeal swab, presence of viral mutation(s) within the areas targeted by this assay, and inadequate number of viral copies(<138 copies/mL). A negative result must be combined with clinical observations, patient history, and epidemiological information. The expected result is Negative.  Fact Sheet for Patients:  EntrepreneurPulse.com.au  Fact Sheet for Healthcare Providers:  IncredibleEmployment.be  This test is no t yet approved or cleared by the [place] FDA and  has been authorized for detection and/or diagnosis of SARS-CoV-2  by FDA under an Emergency Use Authorization (EUA). This EUA will remain  in effect (meaning this test can be used) for the duration of the COVID-19 declaration under Section 564(b)(1) of the Act, 21 U.S.C.section 360bbb-3(b)(1), unless the authorization is terminated  or revoked sooner.       Influenza A by PCR NEGATIVE NEGATIVE Final   Influenza B by PCR NEGATIVE NEGATIVE Final    Comment: (NOTE) The Xpert Xpress SARS-CoV-2/FLU/RSV plus assay is intended as an aid in the diagnosis of influenza from Nasopharyngeal swab specimens and should not be used as a sole basis for treatment. Nasal washings and aspirates are unacceptable for Xpert Xpress SARS-CoV-2/FLU/RSV testing.  Fact Sheet for Patients: EntrepreneurPulse.com.au  Fact Sheet for Healthcare Providers: IncredibleEmployment.be  This test is not yet approved or cleared by the [place] FDA and has been authorized for detection and/or diagnosis of SARS-CoV-2 by FDA under an Emergency Use Authorization (EUA). This EUA will remain in effect (meaning this test can be used) for the duration of the COVID-19 declaration under Section 564(b)(1) of the Act, 21 U.S.C. section 360bbb-3(b)(1), unless the authorization is terminated or revoked.  Performed at [facility], [address] [place], [place] [address]   Culture, blood (routine x 2)     Status: None   Collection Time: 10/31/20 12:06 AM   Specimen: BLOOD  Result Value Ref Range Status   Specimen Description BLOOD RIGHT ARM  Final   Special Requests   Final    BOTTLES DRAWN AEROBIC AND ANAEROBIC Blood Culture adequate volume   Culture   Final    NO GROWTH 5 DAYS Performed at [facility], [address], [place], [place] [address]    Report Status 11/05/2020 FINAL  Final  Culture, blood (routine x 2)     Status: None   Collection Time: 10/31/20 12:06 AM   Specimen: BLOOD  Result Value Ref Range Status    Specimen Description BLOOD LEFT ARM  Final   Special Requests   Final    BOTTLES  DRAWN AEROBIC AND ANAEROBIC Blood Culture results may not be optimal due to an excessive volume of blood received in culture bottles   Culture   Final    NO GROWTH 5 DAYS Performed at [facility], [address] [place], [place] [address]    Report Status 11/05/2020 FINAL  Final  Urine Culture     Status: None   Collection Time: 10/31/20 12:06 AM   Specimen: Urine, Random  Result Value Ref Range Status   Specimen Description   Final    URINE, RANDOM Performed at [facility], [address], [place], [place] [address]    Special Requests   Final    NONE Performed at [facility], [address], [place], [place] [address]    Culture   Final    NO GROWTH Performed at [facility], [place] [address], [place], [place] [address]    Report Status 10/31/2020 FINAL  Final  MRSA Next Gen by PCR, Nasal     Status: None   Collection Time: 10/31/20  4:41 AM   Specimen: Nasal Mucosa; Nasal Swab  Result Value Ref Range Status   MRSA by PCR Next Gen NOT DETECTED NOT DETECTED Final    Comment: (NOTE) The GeneXpert MRSA Assay (FDA approved for NASAL specimens only), is one component of a comprehensive MRSA colonization surveillance program. It is not intended to diagnose MRSA infection nor to guide or monitor treatment for MRSA infections. Test performance is not FDA approved in patients less than 86 years old. Performed at [facility], [address] [place], [place] [address]   Culture, Respiratory w Gram Stain     Status: None   Collection Time: 10/31/20  4:11 PM   Specimen: Tracheal Aspirate; Respiratory  Result Value Ref Range Status   Specimen Description   Final    TRACHEAL ASPIRATE Performed at [facility], [address] [place], [place] [address]    Special Requests   Final    NONE Performed at [facility], [address] [place], [place] [address]    Gram Stain   Final    ABUNDANT WBC PRESENT, PREDOMINANTLY PMN ABUNDANT GRAM POSITIVE COCCI RARE YEAST WITH PSEUDOHYPHAE RARE GRAM POSITIVE RODS Performed at [facility], [place] [address], [place], [place] [address]    Culture ABUNDANT STAPHYLOCOCCUS AUREUS  Final   Report Status 11/03/2020 FINAL  Final   Organism ID, Bacteria STAPHYLOCOCCUS AUREUS  Final      Susceptibility   Staphylococcus aureus - MIC*    CIPROFLOXACIN <=0.5 SENSITIVE Sensitive     ERYTHROMYCIN <=0.25 SENSITIVE Sensitive     GENTAMICIN <=0.5 SENSITIVE Sensitive     OXACILLIN 0.5 SENSITIVE Sensitive     TETRACYCLINE <=1 SENSITIVE Sensitive     VANCOMYCIN <=0.5 SENSITIVE Sensitive     TRIMETH/SULFA <=10 SENSITIVE Sensitive     CLINDAMYCIN <=0.25 SENSITIVE Sensitive     RIFAMPIN <=0.5 SENSITIVE Sensitive     Inducible Clindamycin NEGATIVE Sensitive     * ABUNDANT STAPHYLOCOCCUS AUREUS         Radiology Studies: No results found.      Scheduled Meds:  aspirin  81 mg Per Tube Daily   chlorhexidine gluconate (MEDLINE KIT)  15 mL Mouth Rinse BID   Chlorhexidine Gluconate Cloth  6 each Topical Daily   escitalopram  20 mg Per Tube Daily   heparin injection (subcutaneous)  5,000 Units Subcutaneous Q8H   hydrALAZINE  50  mg Per Tube Q8H   levothyroxine  50 mcg Per Tube Daily   losartan  25 mg Oral Daily   metoprolol tartrate  25 mg Per Tube BID   pantoprazole (PROTONIX) IV  40 mg Intravenous QHS   Continuous Infusions:  sodium chloride 250 mL (11/08/20 1902)     LOS: 9 days   The patient is critically ill with multiple organ systems failure and requires high complexity decision making for assessment and support, frequent evaluation and titration of therapies, application of advanced monitoring technologies and extensive interpretation of multiple databases. Critical Care Time devoted to patient care services described in this note  Time spent: 40 minutes     Marvelyn Bouchillon,  Geraldo Docker, MD [facility]   If 7PM-7AM, please contact night-coverage 11/09/2020, 3:10 PM

## 2020-11-09 NOTE — Progress Notes (Signed)
Physical Therapy Treatment Patient Details Name: Ronald Murphy MRN: [id number] DOB: [birth year] Today's Date: 11/09/2020   History of Present Illness Pt is a 60 y/o M who presented to the ED from home on 10/30/20 due to his son's concern of AMS. Pt was admitted to the ICU on 10/18 & treated for severe transaminitis, possible sepsis, NTSEMI, AKI with severe hyperkalemia, and acute AMS. Pt found to have Tracheal aspirate with Staph aureus. PMH: laryngeal CA s/p tracheostomy, Hep C, alcoholism in remission, anxiety, depression, HTN    PT Comments    Pt had just returned from [facility] and is agreeable to PT session. He was motivated and willing. Continues to follow simple commands consistently however does present with some cognition deficits overall. He was on 5 L trach collar but was placed on 6 L during OOB activity. He did progress gait distances but continues to fatigue extremely quickly. He will greatly benefit form CIR at DC to address deficits while maximizing independence with ADLs. Family arrived post session and has been very supportive throughout hospitalization.    Recommendations for follow up therapy are one component of a multi-disciplinary discharge planning process, led by the attending physician.  Recommendations may be updated based on patient status, additional functional criteria and insurance authorization.  Follow Up Recommendations  Acute inpatient rehab (3hours/day)     Assistance Recommended at Discharge Frequent or constant Supervision/Assistance  Equipment Recommendations  Other (comment) (defer to next level of care)       Precautions / Restrictions Precautions Precautions: Fall Precaution Comments: chronic trach on 5L O2 Restrictions Weight Bearing Restrictions: No     Mobility  Bed Mobility Overal bed mobility: Needs Assistance Bed Mobility: Supine to Sit;Sit to Supine     Supine to sit: Min assist Sit to supine: Min assist   General bed mobility  comments: Min assist to exit and return to supine from bed. HOB slightly elevated, increased time to perform. Sat EOB for several minutes prior to standing    Transfers Overall transfer level: Needs assistance Equipment used: Rolling walker (2 wheels) Transfers: Sit to/from Stand Sit to Stand: +2 safety/equipment;Min assist (2nd person for management of lines/leads/equipment)     Ambulation/Gait Ambulation/Gait assistance: Min assist Gait Distance (Feet): 50 Feet Assistive device: Rolling walker (2 wheels) Gait Pattern/deviations: Trunk flexed;Decreased stride length Gait velocity: decreased   General Gait Details: pt was able to ambulate 50 ft with RW on 6 L trach collar. No LOB however continues to be severely limited by fatigue. Labored breathing however sao2 and HR stable.      Balance Overall balance assessment: Needs assistance Sitting-balance support: Feet supported Sitting balance-Leahy Scale: Good     Standing balance support: During functional activity;Bilateral upper extremity supported Standing balance-Leahy Scale: Fair         Cognition Arousal/Alertness: Awake/alert Behavior During Therapy: WFL for tasks assessed/performed Overall Cognitive Status: Impaired/Different from baseline Area of Impairment: Memory;Problem solving;Following commands;Safety/judgement      Orientation Level: Disoriented to;Place;Time;Situation Current Attention Level: Focused Memory: Decreased short-term memory Following Commands: Follows one step commands with increased time Safety/Judgement: Decreased awareness of safety;Decreased awareness of deficits Awareness: Intellectual Problem Solving: Slow processing;Decreased initiation;Requires verbal cues General Comments: follows commands with increased time. communicates via writing               Pertinent Vitals/Pain Pain Assessment: No/denies pain Faces Pain Scale: No hurt Facial Expression: Relaxed, neutral Body  Movements: Absence of movements Muscle Tension: Relaxed     PT  Goals (current goals can now be found in the care plan section) Acute Rehab PT Goals Patient Stated Goal: Get stronger so I can get home Progress towards PT goals: Progressing toward goals    Frequency    7X/week      PT Plan Current plan remains appropriate    Co-evaluation     PT goals addressed during session: Mobility/safety with mobility;Balance;Proper use of DME;Strengthening/ROM        AM-PAC PT "6 Clicks" Mobility   Outcome Measure  Help needed turning from your back to your side while in a flat bed without using bedrails?: A Little Help needed moving from lying on your back to sitting on the side of a flat bed without using bedrails?: A Lot Help needed moving to and from a bed to a chair (including a wheelchair)?: A Lot Help needed standing up from a chair using your arms (e.g., wheelchair or bedside chair)?: A Lot Help needed to walk in hospital room?: A Lot Help needed climbing 3-5 steps with a railing? : Total 6 Click Score: 12    End of Session Equipment Utilized During Treatment: Oxygen Activity Tolerance: Patient tolerated treatment well Patient left: in bed;with call bell/phone within reach;with bed alarm set Nurse Communication: Mobility status PT Visit Diagnosis: Unsteadiness on feet (R26.81);Muscle weakness (generalized) (M62.81);Difficulty in walking, not elsewhere classified (R26.2)     Time: 4462-8638 PT Time Calculation (min) (ACUTE ONLY): 28 min  Charges:  $Gait Training: 8-22 mins $Therapeutic Activity: 8-22 mins                    Julaine Fusi PTA 11/09/20, 12:29 PM

## 2020-11-10 LAB — CBC WITH DIFFERENTIAL/PLATELET
Abs Immature Granulocytes: 0.05 10*3/uL (ref 0.00–0.07)
Basophils Absolute: 0 10*3/uL (ref 0.0–0.1)
Basophils Relative: 0 %
Eosinophils Absolute: 0.1 10*3/uL (ref 0.0–0.5)
Eosinophils Relative: 1 %
HCT: 35 % — ABNORMAL LOW (ref 39.0–52.0)
Hemoglobin: 11.8 g/dL — ABNORMAL LOW (ref 13.0–17.0)
Immature Granulocytes: 1 %
Lymphocytes Relative: 7 %
Lymphs Abs: 0.6 10*3/uL — ABNORMAL LOW (ref 0.7–4.0)
MCH: 29.6 pg (ref 26.0–34.0)
MCHC: 33.7 g/dL (ref 30.0–36.0)
MCV: 87.7 fL (ref 80.0–100.0)
Monocytes Absolute: 0.5 10*3/uL (ref 0.1–1.0)
Monocytes Relative: 5 %
Neutro Abs: 7.8 10*3/uL — ABNORMAL HIGH (ref 1.7–7.7)
Neutrophils Relative %: 86 %
Platelets: 198 10*3/uL (ref 150–400)
RBC: 3.99 MIL/uL — ABNORMAL LOW (ref 4.22–5.81)
RDW: 15.4 % (ref 11.5–15.5)
WBC: 9.1 10*3/uL (ref 4.0–10.5)
nRBC: 0 % (ref 0.0–0.2)

## 2020-11-10 LAB — COMPREHENSIVE METABOLIC PANEL
ALT: 35 U/L (ref 0–44)
AST: 34 U/L (ref 15–41)
Albumin: 2.6 g/dL — ABNORMAL LOW (ref 3.5–5.0)
Alkaline Phosphatase: 54 U/L (ref 38–126)
Anion gap: 5 (ref 5–15)
BUN: 29 mg/dL — ABNORMAL HIGH (ref 8–23)
CO2: 24 mmol/L (ref 22–32)
Calcium: 8.2 mg/dL — ABNORMAL LOW (ref 8.9–10.3)
Chloride: 112 mmol/L — ABNORMAL HIGH (ref 98–111)
Creatinine, Ser: 0.92 mg/dL (ref 0.61–1.24)
GFR, Estimated: >60 mL/min (ref >60)
Glucose, Bld: 86 mg/dL (ref 70–99)
Potassium: 3.8 mmol/L (ref 3.5–5.1)
Sodium: 141 mmol/L (ref 135–145)
Total Bilirubin: 0.8 mg/dL (ref 0.3–1.2)
Total Protein: 6.1 g/dL — ABNORMAL LOW (ref 6.5–8.1)

## 2020-11-10 LAB — MAGNESIUM: Magnesium: 2.1 mg/dL (ref 1.7–2.4)

## 2020-11-10 LAB — PHOSPHORUS: Phosphorus: 2.7 mg/dL (ref 2.5–4.6)

## 2020-11-10 MED ORDER — ADULT MULTIVITAMIN W/MINERALS CH
1.0000 | ORAL_TABLET | Freq: Every day | ORAL | Status: DC
  Administered 2020-11-10 – 2020-11-12 (×3): 1 via ORAL
  Filled 2020-11-10 (×3): qty 1

## 2020-11-10 NOTE — Progress Notes (Signed)
PROGRESS NOTE    Ronald Murphy  XAJ:[id number] DOB: 1960/09/17 DOA: 10/30/2020 PCP: Pcp, No   Brief Narrative:  60 y.o. 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 (s/p completion of radiation treatments in June 2019 with chemotherapy begun but truncated secondary to tolerability, known C1 metastasis s/p CyberKnife radiation treatment and chemotherapy, most recently on durvalumab which was started 02/04/2019 with last infusion on 02/23/2020), hypertension, ETOH in remission, anxiety/depression, kidney stones presented to [facility] ED from home via EMS on 10/30/2020 due to his son's concern of altered mental status  Subjective: 10/28 A/O x4 speaking with myself and hospice attempting to make up his mind, between discharging on palliative care or hospice.   Assessment & Plan:  Covid vaccination;  Active Problems:   Chronic hepatitis C without hepatic coma (HCC)   Tracheostomy status (HCC)   Acute metabolic encephalopathy   History of laryngeal cancer   Acute renal failure (HCC)   NSTEMI (non-ST elevated myocardial infarction) (HCC)   Small cell carcinoma of lung (HCC)   Radiation-induced esophageal stricture   Chronic, continuous use of opioids   Chronic hyponatremia   Acute hepatitis   Acute respiratory failure (HCC)   Altered mental status   Pressure injury of skin   Protein-calorie malnutrition, severe   Pneumonia due to methicillin susceptible Staphylococcus aureus (MSSA) ([place])   Hypernatremia   #NSTEMI --Evangeline cards following with plans for conservative medical management.  --Completed 48hrs of heparin gtt. --ASA/statin started.  --Cardiology will consider DAPT prior to discharge--messaged Dr K--ok to start po plavix   Acute hypoxic/hypercapnic respiratory failure acute on chronic respiratory failure with hypoxia/hypercapnia -Trach dependent - Currently Shiley 6 mm cuffed 10/31/2020>>> -- patient currently on trach collar --10/24-- plan to change to passi  muir valve --10/25--pt is doing well. Able to talk. Will wean him off the trach collar (used just for humidification)  #Septic Shock 2/2 positive MSSA PNA (resolved) --now weaned entirely from mechanical ventilation --on IV Day cefazolin. Previously completed 2 days of ceftriaxone prior to culture data finalization --Discontinue solumedrol --Continue bronchodilators for now        #Acute metabolic encephalopathy -Multifactorial: Most likely secondary to acute respiratory failure, septic shock, MSSA pneumonia -Underlying causes treated.  Has resolved  #Bilateral basal ganglia lacunar infarct-new since May 2022 --Stat telemetry neuro consult obtained to look over imaging and receive recommendations as patient is receiving high-dose heparin for N-STEMI. Per telemetry neurologist infarcts appear chronic on imaging -- continue aspirin   #Acute kidney injury 2/2 Rhabdomyolysis Lab Results  Component Value Date   CREATININE 0.92 11/10/2020   CREATININE 1.08 11/09/2020   CREATININE 1.09 11/08/2020   CREATININE 1.19 11/07/2020   CREATININE 1.52 (H) 11/06/2020  -Resolved  Hypernatremia Lab Results  Component Value Date   NA 141 11/10/2020   NA 143 11/09/2020   NA 142 11/08/2020   NA 145 11/07/2020   NA 150 (H) 11/06/2020  -Resolved   #Shock liver/Chronic hepatitis C --LFTs downtrending.  --Has underlying active HCV by chart review (+ viral load in [facility] system in 2020).  -LFTs resolved   #Pressure injuries of hip/knee/feet/Burn on left ear --supportive care, wound consult, Turn q2hrs  Hypokalemia - Potassium goal> - 10/27 potassium IV 60 mEq   Hypomagnesmia - Magnesium goal> 2 - 10/27 magnesium IV 4 g  Obese (BMI 28.51kg/m)    DVT prophylaxis: Heparin Code Status: Full Family Communication: 10/27 wife at bedside for discussion of plan of care all questions answered Status is:  Inpatient    Dispo: The patient is from: SNF              Anticipated d/c is to: SNF               Anticipated d/c date is: > 3 days              Patient currently is not medically stable to d/c.      Consultants:  Palliative care FNP Jordan Hawks   Procedures/Significant Events:     I have personally reviewed and interpreted all radiology studies and my findings are as above.  VENTILATOR SETTINGS: 10/26 FiO2 28% SPO2 97%   Cultures   Antimicrobials: Anti-infectives (From admission, onward)    Start     Ordered Stop   11/06/20 1400  ceFAZolin (ANCEF) IVPB 2g/100 mL premix        11/06/20 0742 11/08/20 1244   11/05/20 1400  ceFAZolin (ANCEF) IVPB 1 g/50 mL premix  Status:  Discontinued        11/05/20 0735 11/06/20 0742   11/03/20 1100  ceFAZolin (ANCEF) IVPB 1 g/50 mL premix  Status:  Discontinued        11/03/20 1006 11/05/20 0735   10/31/20 2200  cefTRIAXone (ROCEPHIN) 1 g in sodium chloride 0.9 % 100 mL IVPB  Status:  Discontinued        10/31/20 0258 11/03/20 1006   10/31/20 2200  azithromycin (ZITHROMAX) 500 mg in sodium chloride 0.9 % 250 mL IVPB        10/31/20 0258 11/03/20 2349   10/31/20 0030  cefTRIAXone (ROCEPHIN) 1 g in sodium chloride 0.9 % 100 mL IVPB        10/31/20 0029 10/31/20 0248   10/31/20 0030  azithromycin (ZITHROMAX) 500 mg in sodium chloride 0.9 % 250 mL IVPB        10/31/20 0029 10/31/20 0248         Devices    LINES / TUBES:  Shiley 6 mm cuffed 10/31/2020>>>    Continuous Infusions:  sodium chloride 250 mL (11/09/20 1740)     Objective: Vitals:   11/10/20 0853 11/10/20 0919 11/10/20 0921 11/10/20 1126  BP: (!) 152/88   (!) 142/85  Pulse: 85   88  Resp: (!) 23   20  Temp: 98.3 F (36.8 C)   98.2 F (36.8 C)  TempSrc: Oral   Oral  SpO2: 95% 93% 94% 96%  Weight:      Height:        Intake/Output Summary (Last 24 hours) at 11/10/2020 1255 Last data filed at 11/10/2020 1100 Gross per 24 hour  Intake 340 ml  Output --  Net 340 ml    Filed Weights   11/04/20 0500 11/05/20 0500 11/07/20 0600   Weight: 78.8 kg 76 kg 73 kg    Examination:  General: Alert, nods yes and no to questions positive acute on chronic respiratory distress (chronic trach), cachectic Eyes: negative scleral hemorrhage, negative anisocoria, negative icterus ENT: Negative Runny nose, negative gingival bleeding, Neck:  Negative scars, masses, torticollis, lymphadenopathy, JVD, #6 cuffed Shiley in place, whitish discharge Lungs: coarse breath sounds bilaterally without wheezes or crackles Cardiovascular: Regular rate and rhythm without murmur gallop or rub normal S1 and S2 Abdomen: negative abdominal pain, nondistended, positive soft, bowel sounds, no rebound, no ascites, no appreciable mass Extremities: No significant cyanosis, clubbing, or edema bilateral lower extremities Skin: Negative rashes, lesions, ulcers Psychiatric: Unable to evaluate secondary to chronic trach Central  nervous system:  Cranial nerves II through XII intact, tongue/uvula midline, all extremities muscle strength 5/5, sensation intact throughout, unable to evaluate secondary to chronic trach .     Data Reviewed: Care during the described time interval was provided by me .  I have reviewed this patient's available data, including medical history, events of note, physical examination, and all test results as part of my evaluation.   CBC: Recent Labs  Lab 11/04/20 0436 11/05/20 0455 11/08/20 0847 11/09/20 0444 11/10/20 0530  WBC 11.8* 12.2* 12.0* 10.3 9.1  NEUTROABS 9.9* 9.7* 10.2* 9.1* 7.8*  HGB 11.4* 13.4 13.4 12.0* 11.8*  HCT 33.1* 39.5 38.8* 35.9* 35.0*  MCV 87.3 89.0 87.6 88.2 87.7  PLT 227 225 216 179 892    Basic Metabolic Panel: Recent Labs  Lab 11/04/20 0436 11/05/20 0455 11/06/20 0505 11/07/20 0514 11/08/20 0847 11/09/20 0444 11/10/20 0530  NA 142 147* 150* 145 142 143 141  K 4.3 4.1 3.8 2.8* 3.1* 3.5 3.8  CL 112* 113* 116* 110 109 110 112*  CO2 _0 GLUCOSE 121* 117* 115* 134* 102* 92 86   BUN 86* 104* 92* 67* 44* 34* 29*  CREATININE 2.98* 2.08* 1.52* 1.19 1.09 1.08 0.92  CALCIUM 8.2* 8.6* 8.8* 8.4* 8.5* 8.4* 8.2*  MG 2.2 1.8 2.3  --  1.5* 1.6* 2.1  PHOS 2.5 2.9  --   --  3.2 2.7 2.7    GFR: Estimated Creatinine Clearance: 75.5 mL/min (by C-G formula based on SCr of 0.92 mg/dL). Liver Function Tests: Recent Labs  Lab 11/04/20 0436 11/05/20 0455 11/08/20 0847 11/09/20 0444 11/10/20 0530  AST 113* 94* 46* 36 34  ALT 533* 305* 58* 40 35  ALKPHOS 75 77 66 55 54  BILITOT 0.7 0.7 1.0 0.8 0.8  PROT 6.0* 6.3* 6.5 5.9* 6.1*  ALBUMIN 2.3* 2.5* 2.9* 2.3* 2.6*    No results for input(s): LIPASE, AMYLASE in the last 168 hours. No results for input(s): AMMONIA in the last 168 hours. Coagulation Profile: No results for input(s): INR, PROTIME in the last 168 hours. Cardiac Enzymes: Recent Labs  Lab 11/04/20 0436 11/05/20 0455  CKTOTAL 703* 583*    BNP (last 3 results) No results for input(s): PROBNP in the last 8760 hours. HbA1C: No results for input(s): HGBA1C in the last 72 hours. CBG: Recent Labs  Lab 11/05/20 1616 11/05/20 2330 11/06/20 0304 11/06/20 0739 11/06/20 1236  GLUCAP 123* 130* 110* 113* 152*    Lipid Profile: No results for input(s): CHOL, HDL, LDLCALC, TRIG, CHOLHDL, LDLDIRECT in the last 72 hours. Thyroid Function Tests: No results for input(s): TSH, T4TOTAL, FREET4, T3FREE, THYROIDAB in the last 72 hours. Anemia Panel: No results for input(s): VITAMINB12, FOLATE, FERRITIN, TIBC, IRON, RETICCTPCT in the last 72 hours. Urine analysis:    Component Value Date/Time   COLORURINE AMBER (A) 10/30/2020 0006   APPEARANCEUR CLOUDY (A) 10/30/2020 0006   LABSPEC 1.014 10/30/2020 0006   PHURINE 5.0 10/30/2020 0006   GLUCOSEU 50 (A) 10/30/2020 0006   HGBUR LARGE (A) 10/30/2020 0006   BILIRUBINUR NEGATIVE 10/30/2020 0006   KETONESUR 5 (A) 10/30/2020 0006   PROTEINUR 100 (A) 10/30/2020 0006   NITRITE NEGATIVE 10/30/2020 0006   LEUKOCYTESUR  NEGATIVE 10/30/2020 0006   Sepsis Labs: _1 (procalcitonin:4,lacticidven:4)  ) Recent Results (from the past 240 hour(s))  Culture, Respiratory w Gram Stain     Status: None   Collection Time: 10/31/20  4:11 PM   Specimen: Tracheal  Aspirate; Respiratory  Result Value Ref Range Status   Specimen Description   Final    TRACHEAL ASPIRATE Performed at [facility], [address] [place], [place] [address]    Special Requests   Final    NONE Performed at [facility], [address] [place], [place] [address]    Gram Stain   Final    ABUNDANT WBC PRESENT, PREDOMINANTLY PMN ABUNDANT GRAM POSITIVE COCCI RARE YEAST WITH PSEUDOHYPHAE RARE GRAM POSITIVE RODS Performed at [facility], [place] [address], [place], [place] [address]    Culture ABUNDANT STAPHYLOCOCCUS AUREUS  Final   Report Status 11/03/2020 FINAL  Final   Organism ID, Bacteria STAPHYLOCOCCUS AUREUS  Final      Susceptibility   Staphylococcus aureus - MIC*    CIPROFLOXACIN <=0.5 SENSITIVE Sensitive     ERYTHROMYCIN <=0.25 SENSITIVE Sensitive     GENTAMICIN <=0.5 SENSITIVE Sensitive     OXACILLIN 0.5 SENSITIVE Sensitive     TETRACYCLINE <=1 SENSITIVE Sensitive     VANCOMYCIN <=0.5 SENSITIVE Sensitive     TRIMETH/SULFA <=10 SENSITIVE Sensitive     CLINDAMYCIN <=0.25 SENSITIVE Sensitive     RIFAMPIN <=0.5 SENSITIVE Sensitive     Inducible Clindamycin NEGATIVE Sensitive     * ABUNDANT STAPHYLOCOCCUS AUREUS         Radiology Studies: No results found.      Scheduled Meds:  aspirin  81 mg Per Tube Daily   chlorhexidine gluconate (MEDLINE KIT)  15 mL Mouth Rinse BID   Chlorhexidine Gluconate Cloth  6 each Topical Daily   escitalopram  20 mg Per Tube Daily   heparin injection (subcutaneous)  5,000 Units Subcutaneous Q8H   hydrALAZINE  50 mg Per Tube Q8H   levothyroxine  50 mcg Per Tube Daily   losartan  25 mg Oral Daily   metoprolol tartrate  25 mg Per Tube BID   pantoprazole  (PROTONIX) IV  40 mg Intravenous QHS   Continuous Infusions:  sodium chloride 250 mL (11/09/20 1740)     LOS: 10 days   The patient is critically ill with multiple organ systems failure and requires high complexity decision making for assessment and support, frequent evaluation and titration of therapies, application of advanced monitoring technologies and extensive interpretation of multiple databases. Critical Care Time devoted to patient care services described in this note  Time spent: 40 minutes     Fenris Cauble, Geraldo Docker, MD [facility]   If 7PM-7AM, please contact night-coverage 11/10/2020, 12:55 PM

## 2020-11-10 NOTE — Progress Notes (Signed)
Inpatient Rehab Admissions Coordinator:   Spoke to Jordan Hawks with Palliative who reports pt has decided on home with hospice.  CIR will sign off at this time, and I will discontinue insurance auth.  Please call me with questions.    Shann Medal, PT, DPT Admissions Coordinator [phone] 11/10/20  11:55 AM

## 2020-11-10 NOTE — Progress Notes (Signed)
Physical Therapy Treatment Patient Details Name: Ronald Murphy MRN: [id number] DOB: 25-Dec-1960 Today's Date: 11/10/2020   History of Present Illness Pt is a 60 y/o M who presented to the ED from home on 10/30/20 due to his son's concern of AMS. Pt was admitted to the ICU on 10/18 & treated for severe transaminitis, possible sepsis, NTSEMI, AKI with severe hyperkalemia, and acute AMS. Pt found to have Tracheal aspirate with Staph aureus. PMH: laryngeal CA s/p tracheostomy, Hep C, alcoholism in remission, anxiety, depression, HTN    PT Comments    Pt was long sitting in bed upon arriving. He agrees to PT session and is cooperative throughout. On 5 L trach collar but with ambulation required 6L. He was able to exit bed with min assist, stand to RW with min assist then ambulate ~ 50 ft with min assist. 2nd person for management of lines/lead/O2 tank. Pt did fatigue quicker today. Overall tolerated session well and continue to be great CIR patient. Will continue to follow and progress as able per current POC.    Recommendations for follow up therapy are one component of a multi-disciplinary discharge planning process, led by the attending physician.  Recommendations may be updated based on patient status, additional functional criteria and insurance authorization.  Follow Up Recommendations  Acute inpatient rehab (3hours/day)     Assistance Recommended at Discharge Frequent or constant Supervision/Assistance  Equipment Recommendations  Other (comment) (defer to next level of care)       Precautions / Restrictions Precautions Precautions: Fall Precaution Comments: chronic trach on 5L O2 Restrictions Weight Bearing Restrictions: No     Mobility  Bed Mobility Overal bed mobility: Needs Assistance Bed Mobility: Supine to Sit;Sit to Supine     Supine to sit: Min assist Sit to supine: Min assist        Transfers Overall transfer level: Needs assistance Equipment used: Rolling  walker (2 wheels) Transfers: Sit to/from Stand Sit to Stand: +2 safety/equipment;Min assist           General transfer comment: Pt was able to stand with min assist of one. secound person present for management of times/tubes/leads    Ambulation/Gait Ambulation/Gait assistance: Min assist Gait Distance (Feet): 50 Feet Assistive device: Rolling walker (2 wheels) Gait Pattern/deviations: Trunk flexed;Decreased stride length Gait velocity: decreased   General Gait Details: Pt was able to tolerate ambulation in hallway but fatigued much quickier today. RR elevated however < 30 with reading as desaturated sao2 however author questions pleth reliability. once pt sits quickly recovers to upper 90s     Balance Overall balance assessment: Needs assistance Sitting-balance support: Feet supported Sitting balance-Leahy Scale: Good Sitting balance - Comments: sat EOB unsupported without assistance   Standing balance support: During functional activity;Bilateral upper extremity supported Standing balance-Leahy Scale: Fair       Cognition Arousal/Alertness: Awake/alert Behavior During Therapy: WFL for tasks assessed/performed Overall Cognitive Status: Impaired/Different from baseline Area of Impairment: Memory;Problem solving;Following commands;Safety/judgement      Orientation Level: Disoriented to;Place;Time;Situation Current Attention Level: Focused Memory: Decreased short-term memory Following Commands: Follows one step commands with increased time Safety/Judgement: Decreased awareness of safety;Decreased awareness of deficits Awareness: Intellectual Problem Solving: Slow processing;Decreased initiation;Requires verbal cues General Comments: Pt was A but is only oriented to self and place. Does not really understand full scope of situation               Pertinent Vitals/Pain Pain Assessment: No/denies pain Faces Pain Scale: No hurt Facial Expression: Relaxed,  neutral Body  Movements: Absence of movements Muscle Tension: Relaxed     PT Goals (current goals can now be found in the care plan section) Acute Rehab PT Goals Patient Stated Goal: none stated- pt had flat affect and only talks when asked questions Progress towards PT goals: Progressing toward goals    Frequency    7X/week      PT Plan Current plan remains appropriate    Co-evaluation     PT goals addressed during session: Mobility/safety with mobility;Balance;Proper use of DME;Strengthening/ROM        AM-PAC PT "6 Clicks" Mobility   Outcome Measure  Help needed turning from your back to your side while in a flat bed without using bedrails?: A Little Help needed moving from lying on your back to sitting on the side of a flat bed without using bedrails?: A Little Help needed moving to and from a bed to a chair (including a wheelchair)?: A Lot Help needed standing up from a chair using your arms (e.g., wheelchair or bedside chair)?: A Lot Help needed to walk in hospital room?: A Lot Help needed climbing 3-5 steps with a railing? : A Lot 6 Click Score: 14    End of Session Equipment Utilized During Treatment: Oxygen Activity Tolerance: Patient tolerated treatment well Patient left: in bed;with call bell/phone within reach;with bed alarm set Nurse Communication: Mobility status PT Visit Diagnosis: Unsteadiness on feet (R26.81);Muscle weakness (generalized) (M62.81);Difficulty in walking, not elsewhere classified (R26.2)     Time: 3559-7416 PT Time Calculation (min) (ACUTE ONLY): 20 min  Charges:  $Gait Training: 8-22 mins                     Julaine Fusi PTA 11/10/20, 11:11 AM

## 2020-11-10 NOTE — Progress Notes (Signed)
Nutrition Follow-up  DOCUMENTATION CODES:   Severe malnutrition in context of chronic illness  INTERVENTION:   -MVI with minerals daily -Magic cup TID with meals, each supplement provides 290 kcal and 9 grams of protein  -Hormel Shake TID with meals, each supplement provides 520 kcals and 22 grams protein  NUTRITION DIAGNOSIS:   Severe Malnutrition related to cancer and cancer related treatments as evidenced by severe fat depletion, severe muscle depletion.  Ongoing  GOAL:   Patient will meet greater than or equal to 90% of their needs  Progressing   MONITOR:   PO intake, Supplement acceptance, Labs, Weight trends, Skin, I & O's, Diet advancement  REASON FOR ASSESSMENT:   Ventilator    ASSESSMENT:   60 y/o male with h/o SCC lung, laryngeal cancer s/p chemotherapy/XRT, radiation induced esophageal stricture s/p dilation, chronic trach, recurrent aspiration PNA, Hep C, anxiety, depression, etoh abuse (remission) and HTN who is admitted with lacunar infarct, CAP, septic shock and NSTEMI.  10/18 - admitted to ICU, ventilated via trach. NGT placed, feeds initiated 10/22 - completely weaned from vent 10/23 - transferred out of ICU 10/24- s/p BSE- advanced to dysphagia 1 diet with nectar thick liquids, NGT pulled, TF d/c  Reviewed I/O's: -40 ml x 24 hours and -2.6 L since admission  UOP: 400 ml x 24 hours  Pt remains with trach, on trach collar.   Pt resting in bed. RD did not disturb. No family at bedside.   Pt with very minimal intake. Noted meal completion 0-10%. Lunch tray at bedside, which was unattempted.   Palliative care following; pt may go home with hospice. He does not desire a PEG.   Medications reviewed.   Labs reviewed.   Diet Order:   Diet Order             DIET - DYS 1 Room service appropriate? Yes with Assist; Fluid consistency: Nectar Thick  Diet effective now                   EDUCATION NEEDS:   No education needs have been  identified at this time  Skin:  Skin Assessment: Skin Integrity Issues: Skin Integrity Issues:: Other (Comment), Stage I Stage I: lt hip, rt knee, lt shoulder Other: IAD buttocks  Last BM:  11/10/20  Height:   Ht Readings from Last 1 Encounters:  10/30/20 5\' 3"  (1.6 m)    Weight:   Wt Readings from Last 1 Encounters:  11/07/20 73 kg    Ideal Body Weight:  56.36 kg  BMI:  Body mass index is 28.51 kg/m.  Estimated Nutritional Needs:   Kcal:  1900-2100 kcal/d  Protein:  95-110 g/d  Fluid:  1.8-2 L/d    Loistine Chance, RD, LDN, [place] Registered Dietitian II Certified Diabetes Care and Education Specialist Please refer to [facility] for RD and/or RD on-call/weekend/after hours pager

## 2020-11-10 NOTE — Progress Notes (Addendum)
Occupational Therapy Treatment Patient Details Name: Ronald Murphy MRN: [id number] DOB: [birth year] Today's Date: 11/10/2020   History of present illness Pt is a 60 y/o M who presented to the ED from home on 10/30/20 due to his son's concern of AMS. Pt was admitted to the ICU on 10/18 & treated for severe transaminitis, possible sepsis, NTSEMI, AKI with severe hyperkalemia, and acute AMS. Pt found to have Tracheal aspirate with Staph aureus. PMH: laryngeal CA s/p tracheostomy, Hep C, alcoholism in remission, anxiety, depression, HTN   OT comments  Mr Spieler was seen for OT treatment on this date. Upon arrival to room pt reclined in bed, agreeable to tx. Pt requires MAX A don B socks seated EOB. MIN A + RW for ADL t/f. Pt tolerated ~2 min standing x 2 trials at sink, requires BUE support, SpO2 mid 90s t/o on 5L [place]. Pt reports 5/10 fatigue following initial stand, decerasing to 10/10 following 2nd stand. Pt making good progress toward goals. Pt continues to benefit from skilled OT services to maximize return to PLOF and minimize risk of future falls, injury, caregiver burden, and readmission. Will continue to follow POC. Discharge recommendation updated to [facility] following pt's decision to return home c hospice.    Recommendations for follow up therapy are one component of a multi-disciplinary discharge planning process, led by the attending physician.  Recommendations may be updated based on patient status, additional functional criteria and insurance authorization.    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 secound person present for management of times/tubes/leads     Balance Overall balance assessment: Needs assistance Sitting-balance support: Feet supported Sitting balance-Leahy Scale: Good Sitting balance - Comments: sat EOB unsupported without assistance   Standing balance support: During functional activity;Bilateral upper extremity supported Standing balance-Leahy Scale: Fair                             ADL either performed or assessed with clinical judgement   ADL Overall ADL's : Needs assistance/impaired                                       General ADL Comments: MAX A don B socks seated EOB. MIN A + RW for ADL t/f. Pt tolerated ~2 min standing x 2 trials at sink, requires BUE support, SpO2 mid 90s t/o on 5L [place].      Cognition Arousal/Alertness: Awake/alert Behavior During Therapy: WFL for tasks assessed/performed Overall Cognitive Status: Impaired/Different from baseline Area of Impairment: Memory;Problem solving;Following commands;Safety/judgement  Memory: Decreased short-term memory Following Commands: Follows one step commands with increased time Safety/Judgement: Decreased awareness of safety;Decreased awareness of deficits Awareness: Intellectual Problem Solving: Slow processing;Decreased initiation;Requires verbal cues General Comments: Pt requires cues and increased time to recall 1/2 swallowing precautions.          Exercises Exercises: Other exercises Other Exercises Other Exercises: Pt and family educ re: OT role, d/c recs, importance of mvmt  for functional mobility Other Exercises: Pt sup<>sit, sit<>stand x2, 3 side steps x2   Shoulder Instructions       General Comments SpO2 mid 90s t/o session on 5L trach collar    Pertinent Vitals/ Pain       Pain Assessment: No/denies pain         Frequency  Min 3X/week        Progress Toward Goals  OT Goals(current goals can now be found in the care plan section)  Progress towards OT goals: Progressing toward goals  Acute Rehab OT Goals Patient Stated Goal: to go home OT Goal Formulation: With patient Time For Goal Achievement: 11/24/20 Potential to Achieve Goals: Good ADL Goals Pt Will Perform Grooming: with modified independence;sitting Pt Will Perform Lower Body Dressing: sitting/lateral leans Pt Will Transfer to Toilet: with modified independence  Plan Discharge plan remains appropriate;Frequency remains appropriate    Co-evaluation                 AM-PAC OT "6 Clicks" Daily Activity     Outcome Measure   Help from another person eating meals?: A Little Help from another person taking care of personal grooming?: A Little Help from another person toileting, which includes using toliet, bedpan, or urinal?: A Lot Help from another person bathing (including washing, rinsing, drying)?: A Lot Help from another person to put on and taking off regular upper body clothing?: A Lot Help from another person to put on and taking off regular lower body clothing?: A Lot 6 Click Score: 14    End of Session Equipment Utilized During Treatment: Rolling walker (2 wheels)  OT Visit Diagnosis: Unsteadiness on feet (R26.81);Other abnormalities of gait and mobility (R26.89);Muscle weakness (generalized) (M62.81)   Activity Tolerance Patient tolerated treatment well;Patient limited by fatigue   Patient Left in bed;with call bell/phone within reach;with bed alarm set   Nurse Communication Mobility status        Time: 5701-7793 OT Time Calculation (min): 23  min  Charges: OT General Charges $OT Visit: 1 Visit OT Treatments $Self Care/Home Management : 8-22 mins $Therapeutic Activity: 8-22 mins  Dessie Coma, M.S. OTR/L  11/10/20, 3:46 PM  ascom [phone]

## 2020-11-10 NOTE — Progress Notes (Addendum)
Palliative Care Progress Note, Assessment & Plan   Patient Name: Ronald Murphy       Date: 11/10/2020 DOB: 1960/09/21  Age: 60 y.o. MRN#: [id number] Attending Physician: Allie Bossier, MD Primary Care Physician: Pcp, No Admit Date: 10/30/2020  Reason for Consultation/Follow-up: Establishing goals of care  HPI: 60 y.o. 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  Admission work-up reveals pneumonia, severe hypoxic respiratory failure, septic shock, acute ischemic cardiomyopathy, acute renal failure, liver failure, acidosis, and possible NSTEMI.    Palliative medicine team was consulted to discuss goals of care.  Plan of Care: As a follow-up to our conversation yesterday I met with the patient at bedside along with his wife.  I asked if he had any questions or concerns regarding all of the information we reviewed yesterday.  He said no.  I asked what his decision was as far as moving forward with a medically aggressive treatment plan and going to [facility] inpatient rehab, or going home with a comfort pathway.  He said he would like to go home with hospice.    I clarify that he does not want to go to inpatient rehab but instead would like to go directly from the hospital to his house.  He said yes.  He wants to get home, live, pay his bills, and not have to come back to the hospital.  I again confirmed the patient understands that he is at high risk for aspiration.  He says he will do his best with a chin tuck.  I relayed again that even with the best chin tuck he will likely aspirate.  He says he just wants to go home and not have to return to the  hospital.  I reviewed that in choosing a comfort pathway and going home with hospice that his CODE STATUS would likely need to be changed.  As it stands right now he is a full code.  I described what all is entailed with a CODE BLUE, CPR, and ventilator support.  He said he is not ready to make that change right now.  I shared that a full comfort pathway involves allowing a natural death.  He again said he was not ready to make that choice.  I spoke with hospice liaison Winfred Leeds who confirms that patient can go home with hospice with a full CODE STATUS.   He shares he is worried about this decision and does not know if it is the right one for him.  He says he does not want a PEG tube but is concerned about if this is the right decision for him.  I asked if he wanted to change his mind and he said no.  He wants to go home with hospice.   Dr. Sherral Hammers, Shann Medal with rehab, and SLP Belenda Cruise made aware that patient does not want a PEG, he does not want to go to rehab, and he wants to go directly home with Hospice.    Chart update: 68 -I spoke with the patient's son Adrain Nesbit. who believes the  patient should move towards a full comfort path, be home with hospice, and allow a natural death with a DNR in place.  He says his father can usually be very rational but is trying to pick and choose parts of his given options without realizing that he cannot be in agreement with a half comfort, half aggressive treatment plan. I shared with Georgiana Spinner. That I will convey his opinion to the patient.   1400 - I rounded on the patient again this afternoon and he has changed his mind.  He shares he is not sure what he wants to do.  He confirms he does not want to go to [facility] inpatient rehab.  However he is unsure of his CODE STATUS.  He is unsure if he wants a PEG tube or not now.  He is unsure if he wants to go home with hospice or to go home with palliative care services.  Dr. Sherral Hammers and myself had an extensive  discussion with him wherein we outlined his current medical status as well all of his options.  He would not give a definitive answer.  He said he needed to talk to Georgiana Spinner.  I shared Sundiata Jr's views and opinions as he conveyed them during our conversation earlier today.  The patient still did not verbalize a decision and continued to say he didn't know what he wanted.   I assured him that we would support him in whichever decision he makes.  I want to make sure he has enough time and space to really process, think about, and make an educated decision based on what he wants.  I assured the patient had our contact information.  Dr. Sherral Hammers also assured the patient he would be here throughout the weekend and be checking back with him regarding decisions.  Questions and concerns were addressed. The family was encouraged to call with questions or concerns.    Code Status: Full code  Prognosis:  Unable to determine  Discharge Planning: Home with Hospice  Recommendations/Plan: Decision for disposition TBD: Home with Hospice Home with [place] with PT/OT FULL CODE  Care plan was discussed with Dr. Sherral Hammers, Shann Medal, nursing, patient, patient's wife  Length of Stay: 10  Physical Exam Constitutional:      Appearance: Normal appearance.  HENT:     Head: Normocephalic and atraumatic.     Mouth/Throat:     Mouth: Mucous membranes are moist.  Cardiovascular:     Rate and Rhythm: Normal rate.  Pulmonary:     Effort: Pulmonary effort is normal.  Abdominal:     Palpations: Abdomen is soft.  Skin:    General: Skin is warm and dry.  Neurological:     Mental Status: He is alert. Mental status is at baseline.  Psychiatric:        Mood and Affect: Mood normal.        Behavior: Behavior normal.        Thought Content: Thought content normal.        Judgment: Judgment normal.            Vital Signs: BP (!) 142/85 (BP Location: Right Arm)   Pulse 88   Temp 98.2 F  (36.8 C) (Oral)   Resp 20   Ht _0  (1.6 m)   Wt 73 kg   SpO2 96%   BMI 28.51 kg/m  SpO2: SpO2: 96 % O2 Device: O2 Device: Tracheostomy Collar O2 Flow Rate: O2 Flow Rate (L/min):  5 L/min      Palliative Assessment/Data: 60%       Total Time 65 minutes Prolonged Time Billed  no   Greater than 50%  of this time was spent counseling and coordinating care related to the above assessment and plan.  Thank you for allowing the Palliative Medicine Team to assist in the care of this patient.  Clio Ilsa Iha, FNP-BC Palliative Medicine Team Team Phone # [phone]

## 2020-11-11 DIAGNOSIS — R1310 Dysphagia, unspecified: Secondary | ICD-10-CM

## 2020-11-11 LAB — CBC WITH DIFFERENTIAL/PLATELET
Abs Immature Granulocytes: 0.04 10*3/uL (ref 0.00–0.07)
Basophils Absolute: 0 10*3/uL (ref 0.0–0.1)
Basophils Relative: 0 %
Eosinophils Absolute: 0.1 10*3/uL (ref 0.0–0.5)
Eosinophils Relative: 1 %
HCT: 36.7 % — ABNORMAL LOW (ref 39.0–52.0)
Hemoglobin: 12.6 g/dL — ABNORMAL LOW (ref 13.0–17.0)
Immature Granulocytes: 0 %
Lymphocytes Relative: 7 %
Lymphs Abs: 0.8 10*3/uL (ref 0.7–4.0)
MCH: 30.7 pg (ref 26.0–34.0)
MCHC: 34.3 g/dL (ref 30.0–36.0)
MCV: 89.5 fL (ref 80.0–100.0)
Monocytes Absolute: 0.5 10*3/uL (ref 0.1–1.0)
Monocytes Relative: 4 %
Neutro Abs: 9.5 10*3/uL — ABNORMAL HIGH (ref 1.7–7.7)
Neutrophils Relative %: 88 %
Platelets: 243 10*3/uL (ref 150–400)
RBC: 4.1 MIL/uL — ABNORMAL LOW (ref 4.22–5.81)
RDW: 15.4 % (ref 11.5–15.5)
WBC: 10.8 10*3/uL — ABNORMAL HIGH (ref 4.0–10.5)
nRBC: 0 % (ref 0.0–0.2)

## 2020-11-11 LAB — COMPREHENSIVE METABOLIC PANEL
ALT: 33 U/L (ref 0–44)
AST: 36 U/L (ref 15–41)
Albumin: 2.7 g/dL — ABNORMAL LOW (ref 3.5–5.0)
Alkaline Phosphatase: 63 U/L (ref 38–126)
Anion gap: 8 (ref 5–15)
BUN: 25 mg/dL — ABNORMAL HIGH (ref 8–23)
CO2: 23 mmol/L (ref 22–32)
Calcium: 8.5 mg/dL — ABNORMAL LOW (ref 8.9–10.3)
Chloride: 110 mmol/L (ref 98–111)
Creatinine, Ser: 0.93 mg/dL (ref 0.61–1.24)
GFR, Estimated: >60 mL/min (ref >60)
Glucose, Bld: 95 mg/dL (ref 70–99)
Potassium: 3.4 mmol/L — ABNORMAL LOW (ref 3.5–5.1)
Sodium: 141 mmol/L (ref 135–145)
Total Bilirubin: 1.1 mg/dL (ref 0.3–1.2)
Total Protein: 6.6 g/dL (ref 6.5–8.1)

## 2020-11-11 LAB — PHOSPHORUS: Phosphorus: 3.2 mg/dL (ref 2.5–4.6)

## 2020-11-11 LAB — MAGNESIUM: Magnesium: 1.8 mg/dL (ref 1.7–2.4)

## 2020-11-11 MED ORDER — POTASSIUM CHLORIDE 20 MEQ PO PACK
40.0000 meq | PACK | Freq: Once | ORAL | Status: AC
  Administered 2020-11-11: 40 meq via ORAL
  Filled 2020-11-11: qty 2

## 2020-11-11 NOTE — TOC Progression Note (Addendum)
Transition of Care [facility]) - Progression Note    Patient Details  Name: Ronald Murphy MRN: [id number] Date of Birth: [birth year]  Transition of Care [facility]) CM/SW [place], LCSW Phone Number: 11/11/2020, 2:28 PM  Clinical Narrative:   MD asked CSW to follow up on patient's decision of home with hospice versus home with home health/palliative care.  Spoke to patient at bedside with son on speaker phone. Patient stated he would like to go home with home health. No agency preference. Patient said he wants a feeding tube.  CSW updated RN and MD, asked for list of DME that needs to be ordered.  Notified Lorenza Cambridge of OP Palliative.  Working on establishing [place] coverage.   2:40- [facility] and [facility] unable to take patients with trach collar at this time. Apolonio Schneiders with [facility] is going to check and see if she can accept patient.       Expected Discharge Plan and Services                                                 Social Determinants of Health (SDOH) Interventions    Readmission Risk Interventions Readmission Risk Prevention Plan 11/03/2020  Transportation Screening Complete  Medication Review Press photographer) Not Complete  Med Review Comments Spoke with Son who had to hang up to take care of personal business.  PCP or Specialist appointment within 3-5 days of discharge Not Complete  PCP/Specialist Appt Not Complete comments NMS for discharge  [place] or [place] Complete  SW Recovery Care/Counseling Consult Complete  Palliative Care Screening Not Applicable  Skilled Nursing Facility Complete  Some recent data might be hidden

## 2020-11-11 NOTE — Progress Notes (Signed)
Physical Therapy Treatment Patient Details Name: Ronald Murphy MRN: [id number] DOB: 12/20/1960 Today's Date: 11/11/2020   History of Present Illness Pt is a 60 y/o M who presented to the ED from home on 10/30/20 due to his son's concern of AMS. Pt was admitted to the ICU on 10/18 & treated for severe transaminitis, possible sepsis, NTSEMI, AKI with severe hyperkalemia, and acute AMS. Pt found to have Tracheal aspirate with Staph aureus. PMH: laryngeal CA s/p tracheostomy, Hep C, alcoholism in remission, anxiety, depression, HTN    PT Comments    Pt was long sitting in bed watching TV upon arriving. He had barely touch breakfast tray in front of him. Encouraged increased PO intake for strengthening and nutrition. Pt does not volunteer much conversation endless initiated by Chief Strategy Officer. Presents with increased fatigue today requesting not to go walk in hallway however was agreeable to get OOB to recliner. He was able to exit L side of bed. Stand to RW and ambulate ~ 15 ft to recliner. No LOB with use of RW. Pt was encouraged and did perform several exercises while sitting in recliner. Recommend pt continue to perform throughout the day. RN aware of pt's abilities. Recommend rehab however pt is planning to DC home with hospice. Would greatly benefit from continued skilled PT going forward.     Recommendations for follow up therapy are one component of a multi-disciplinary discharge planning process, led by the attending physician.  Recommendations may be updated based on patient status, additional functional criteria and insurance authorization.  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 Incraesed time to perform with vcs throughout for improved techniquye and sequencing    Transfers Overall transfer level: Needs assistance Equipment used: Rolling walker (2 wheels) Transfers: Sit to/from Stand Sit to Stand: Min guard           General transfer comment: CGA for safety with management of lines/tubes and monitoring    Ambulation/Gait Ambulation/Gait assistance: Min guard Gait Distance (Feet): 15 Feet Assistive device: Rolling walker (2 wheels) Gait Pattern/deviations: Trunk flexed;Decreased stride length Gait velocity: decreased   General Gait Details: pt did not want to ambulate into hallway today 2/2 to fatigue. he did however exit L side of bed and ambulate around bed to recliner. no LOB with use of RW. on 5 L trach collar throughout     Balance Overall balance assessment: Needs assistance Sitting-balance support: Feet supported Sitting balance-Leahy Scale: Good     Standing balance support: During functional activity;Bilateral upper extremity supported Standing balance-Leahy Scale: Fair         Cognition Arousal/Alertness: Awake/alert Behavior During Therapy: WFL for tasks assessed/performed Overall Cognitive Status: Impaired/Different from baseline Area of Impairment: Memory;Problem solving;Following commands;Safety/judgement                 Orientation Level: Disoriented to;Situation Current Attention Level: Focused Memory: Decreased short-term memory Following Commands: Follows one step commands with increased time Safety/Judgement: Decreased  awareness of safety;Decreased awareness of deficits Awareness: Intellectual Problem Solving: Slow processing;Decreased initiation;Requires verbal cues General  Comments: pt had barely touch breakfast tray in fornt of him however did not want to eat more. Encouraged increased PO intake. Pt is cooperative and pleasant           General Comments General comments (skin integrity, edema, etc.): per palliative yesterday, pt is planning to DC straight home with hospice      Pertinent Vitals/Pain Pain Assessment: No/denies pain Faces Pain Scale: No hurt     PT Goals (current goals can now be found in the care plan section) Acute Rehab PT Goals Patient Stated Goal: none stated. pt does not volunteer any info however does answer all questions appropriately. oriented x 3 Progress towards PT goals: Progressing toward goals    Frequency    7X/week      PT Plan Current plan remains appropriate    Co-evaluation     PT goals addressed during session: Mobility/safety with mobility;Balance        AM-PAC PT "6 Clicks" Mobility   Outcome Measure  Help needed turning from your back to your side while in a flat bed without using bedrails?: A Little Help needed moving from lying on your back to sitting on the side of a flat bed without using bedrails?: A Little Help needed moving to and from a bed to a chair (including a wheelchair)?: A Little Help needed standing up from a chair using your arms (e.g., wheelchair or bedside chair)?: A Little Help needed to walk in hospital room?: A Little Help needed climbing 3-5 steps with a railing? : A Lot 6 Click Score: 17    End of Session Equipment Utilized During Treatment: Oxygen;Other (comment) (trach collar) Activity Tolerance: Patient tolerated treatment well Patient left: in chair;with call bell/phone within reach;with chair alarm set Nurse Communication: Mobility status PT Visit Diagnosis: Unsteadiness on feet (R26.81);Muscle weakness (generalized) (M62.81);Difficulty in walking, not elsewhere classified (R26.2)     Time: 3736-6815 PT Time Calculation (min) (ACUTE ONLY): 25 min  Charges:   $Gait Training: 8-22 mins $Therapeutic Activity: 8-22 mins                    Julaine Fusi PTA 11/11/20, 9:28 AM

## 2020-11-11 NOTE — Progress Notes (Signed)
[place] [facility]) Hospital Liaison Note  Notified by [facility] manager of patient/family request for [facility] Palliative services at home after discharge.  [facility] hospital liaison will follow patient for discharge disposition.  Please call with any hospice or outpatient palliative care related questions.  Thank you for the opportunity to participate in this patient's care.  Bobbie "Loren Racer, [place], [place] [phone]

## 2020-11-12 LAB — CBC WITH DIFFERENTIAL/PLATELET
Abs Immature Granulocytes: 0.04 10*3/uL (ref 0.00–0.07)
Basophils Absolute: 0 10*3/uL (ref 0.0–0.1)
Basophils Relative: 0 %
Eosinophils Absolute: 0.1 10*3/uL (ref 0.0–0.5)
Eosinophils Relative: 1 %
HCT: 33 % — ABNORMAL LOW (ref 39.0–52.0)
Hemoglobin: 11.1 g/dL — ABNORMAL LOW (ref 13.0–17.0)
Immature Granulocytes: 1 %
Lymphocytes Relative: 9 %
Lymphs Abs: 0.7 10*3/uL (ref 0.7–4.0)
MCH: 29.4 pg (ref 26.0–34.0)
MCHC: 33.6 g/dL (ref 30.0–36.0)
MCV: 87.3 fL (ref 80.0–100.0)
Monocytes Absolute: 0.4 10*3/uL (ref 0.1–1.0)
Monocytes Relative: 5 %
Neutro Abs: 6.7 10*3/uL (ref 1.7–7.7)
Neutrophils Relative %: 84 %
Platelets: 223 10*3/uL (ref 150–400)
RBC: 3.78 MIL/uL — ABNORMAL LOW (ref 4.22–5.81)
RDW: 15.6 % — ABNORMAL HIGH (ref 11.5–15.5)
WBC: 7.9 10*3/uL (ref 4.0–10.5)
nRBC: 0 % (ref 0.0–0.2)

## 2020-11-12 LAB — COMPREHENSIVE METABOLIC PANEL
ALT: 30 U/L (ref 0–44)
AST: 32 U/L (ref 15–41)
Albumin: 2.4 g/dL — ABNORMAL LOW (ref 3.5–5.0)
Alkaline Phosphatase: 53 U/L (ref 38–126)
Anion gap: 7 (ref 5–15)
BUN: 22 mg/dL (ref 8–23)
CO2: 24 mmol/L (ref 22–32)
Calcium: 8.2 mg/dL — ABNORMAL LOW (ref 8.9–10.3)
Chloride: 109 mmol/L (ref 98–111)
Creatinine, Ser: 0.86 mg/dL (ref 0.61–1.24)
GFR, Estimated: >60 mL/min (ref >60)
Glucose, Bld: 90 mg/dL (ref 70–99)
Potassium: 3.6 mmol/L (ref 3.5–5.1)
Sodium: 140 mmol/L (ref 135–145)
Total Bilirubin: 0.7 mg/dL (ref 0.3–1.2)
Total Protein: 6 g/dL — ABNORMAL LOW (ref 6.5–8.1)

## 2020-11-12 LAB — PHOSPHORUS: Phosphorus: 3.1 mg/dL (ref 2.5–4.6)

## 2020-11-12 LAB — MAGNESIUM: Magnesium: 1.6 mg/dL — ABNORMAL LOW (ref 1.7–2.4)

## 2020-11-12 MED ORDER — HEPARIN SODIUM (PORCINE) 5000 UNIT/ML IJ SOLN: 5000.0000 [IU] | Freq: Three times a day (TID) | INTRAMUSCULAR | Status: DC

## 2020-11-12 NOTE — Progress Notes (Signed)
Physical Therapy Treatment Patient Details Name: Ronald Murphy MRN: [id number] DOB: July 26, 1960 Today's Date: 11/12/2020   History of Present Illness Pt is a 60 y/o M who presented to the ED from home on 10/30/20 due to his son's concern of AMS. Pt was admitted to the ICU on 10/18 & treated for severe transaminitis, possible sepsis, NTSEMI, AKI with severe hyperkalemia, and acute AMS. Pt found to have Tracheal aspirate with Staph aureus. PMH: laryngeal CA s/p tracheostomy, Hep C, alcoholism in remission, anxiety, depression, HTN    PT Comments    Pt seen for PT tx with pt agreeable to OOB mobility. Pt is able to complete bed mobility with supervision but HOB slightly elevated & use of bed rails. Pt completes stand pivot bed>recliner with RW & CGA but once in chair declines further gait or chair level exercises. Pt noted to have excessive secretions from trach collar going into tubing & PT assisted pt with changing into a clean gown & cleaning up as much as possible - nurse notified but already aware. Of note, per chart pt is wishing to d/c home with hospice so change in frequency to 2x/week and well as recommendations of HHPT f/u.    Recommendations for follow up therapy are one component of a multi-disciplinary discharge planning process, led by the attending physician.  Recommendations may be updated based on patient status, additional functional criteria and insurance authorization.  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    Ambulation/Gait                 Stairs             Wheelchair Mobility    Modified Rankin (Stroke Patients Only)       Balance Overall balance assessment: Needs assistance Sitting-balance support: Feet supported Sitting balance-Leahy Scale: Good Sitting balance - Comments: sat EOB unsupported without assistance   Standing balance support: During functional activity;Bilateral upper extremity supported Standing balance-Leahy Scale: Fair Standing balance comment: BUE support on RW with CGA                            Cognition Arousal/Alertness: Awake/alert Behavior During Therapy: WFL for tasks assessed/performed Overall Cognitive Status: Impaired/Different from baseline Area of Impairment: Memory;Problem solving;Following commands;Safety/judgement;Awareness                     Memory: Decreased short-term memory Following Commands: Follows one step commands with increased time Safety/Judgement: Decreased awareness of safety;Decreased awareness of deficits Awareness: Intellectual   General Comments: Pt's condom cathether  had fallen off & pt soiled the bed with urine & appears unaware.        Exercises      General Comments        Pertinent Vitals/Pain Pain Assessment: Faces Faces Pain Scale: No hurt    Home Living                          Prior Function            PT Goals (current goals can now be found in the care plan section) Acute Rehab PT Goals Patient Stated Goal: none stated PT  Goal Formulation: With patient Time For Goal Achievement: 11/18/20 Potential to Achieve Goals: Fair Progress towards PT goals: Progressing toward goals    Frequency    Min 2X/week      PT Plan Frequency needs to be updated    Co-evaluation              AM-PAC PT "6 Clicks" Mobility   Outcome Measure  Help needed turning from your back to your side while in a flat bed without using bedrails?: A Little Help needed moving from lying on your back to sitting on the side of a flat bed without using bedrails?: A Little Help needed moving to and from a bed to a chair (including a wheelchair)?: A Little Help needed standing up from a chair using your arms (e.g., wheelchair or bedside chair)?: A Little Help needed to walk in hospital room?: A Little Help needed climbing 3-5 steps with a railing? : A Lot 6 Click Score: 17    End of Session Equipment Utilized During Treatment: Oxygen;Other (comment) (trach collar) Activity Tolerance: Patient limited by fatigue Patient left: in chair;with chair alarm set;with call bell/phone within reach Nurse Communication: Mobility status (trach secretions) PT Visit Diagnosis: Unsteadiness on feet (R26.81);Muscle weakness (generalized) (M62.81);Difficulty in walking, not elsewhere classified (R26.2)     Time: 0814-4818 PT Time Calculation (min) (ACUTE ONLY): 14 min  Charges:  $Therapeutic Activity: 8-22 mins                     Ronald Murphy, PT, DPT 11/12/20, 11:30 AM    Ronald Murphy 11/12/2020, 11:28 AM

## 2020-11-12 NOTE — TOC Progression Note (Addendum)
Transition of Care [facility]) - Progression Note    Patient Details  Name: Ronald Murphy MRN: [id number] Date of Birth: 08-Oct-1960  [facility] CM/SW Branch, LCSW Phone Number: 11/12/2020, 10:46 AM  Clinical Narrative:   Per MD, trying to determine if patient can get a feeding tube.  CSW reached out to [place] with [facility] to see if they can take patient. She stated their clinical team is still reviewing the patient's information. Informed her per RN yesterday, patient has had trach for a few years. Apolonio Schneiders will update CSW Caryl Pina tomorrow, number provided.   Attempted call to son with update. No answer and VM not set up.          Expected Discharge Plan and Services                                                 Social Determinants of Health (SDOH) Interventions    Readmission Risk Interventions Readmission Risk Prevention Plan 11/03/2020  Transportation Screening Complete  Medication Review Press photographer) Not Complete  Med Review Comments Spoke with Son who had to hang up to take care of personal business.  PCP or Specialist appointment within 3-5 days of discharge Not Complete  PCP/Specialist Appt Not Complete comments NMS for discharge  [place] or [place] Complete  SW Recovery Care/Counseling Consult Complete  Palliative Care Screening Not Applicable  Skilled Nursing Facility Complete  Some recent data might be hidden

## 2020-11-12 NOTE — Progress Notes (Addendum)
PROGRESS NOTE    Ronald Murphy  VZD:[id number] DOB: [birth year] DOA: 10/30/2020 PCP: Pcp, No   Brief Narrative:  60 y.o. 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  Sitting comfortably in chair    Assessment & Plan:  Covid vaccination;  Active Problems:   Chronic hepatitis C without hepatic coma (HCC)   Tracheostomy status (HCC)   Acute metabolic encephalopathy   History of laryngeal cancer   Acute renal failure (HCC)   NSTEMI (non-ST elevated myocardial infarction) (HCC)   Small cell carcinoma of lung (HCC)   Radiation-induced esophageal stricture   Chronic, continuous use of opioids   Chronic hyponatremia   Acute hepatitis   Acute respiratory failure (HCC)   Altered mental status   Pressure injury of skin   Protein-calorie malnutrition, severe   Pneumonia due to methicillin susceptible Staphylococcus aureus (MSSA) ([place])   Hypernatremia   #NSTEMI --[place] cards following with plans for conservative medical management.  --Completed 48hrs of heparin gtt. --ASA/statin started.  --Cardiology will consider DAPT prior to discharge--messaged Dr K--ok to start po plavix   Acute on chronic hypoxic and hypercapnic respiratory failure -Trach dependent - Currently Shiley 6 mm cuffed 10/31/2020>>> -- patient currently on trach collar --10/24-- plan to change to passi muir valve --10/25--pt is doing well. Able to talk. 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 In context of acute illness/decompensation, recommend continuing dysphagia 1 (puree) and nectar thick liquids with strict precautions/compensations, including: upright for all PO intake; chin tuck with all swallows. Hard cough after liquids to clear penetration. Double swallow for solids, follow with sip of liquid. Slow rate, small bites and sips -ADDENDUM 10/29 patient no longer wants to discharge on home hospice.  Would like to be considered for PEG tube placement and then discharged on home palliative care.  #Septic Shock 2/2 positive MSSA PNA (resolved) --now weaned entirely from mechanical ventilation --on IV Day cefazolin. Previously completed 2 days of ceftriaxone prior to culture data finalization --Discontinue solumedrol --Continue bronchodilators for now        #Acute metabolic encephalopathy -Multifactorial: Most likely secondary to acute respiratory failure, septic shock, MSSA pneumonia -Underlying causes treated.  Has resolved  #Bilateral basal ganglia lacunar infarct-new since May 2022 --Stat telemetry neuro consult obtained to look over imaging and receive recommendations as patient is receiving high-dose heparin for N-STEMI. Per telemetry neurologist infarcts appear chronic on imaging -- continue aspirin   #Acute kidney injury 2/2 Rhabdomyolysis Lab Results  Component Value Date   CREATININE 0.86 11/12/2020   CREATININE 0.93 11/11/2020   CREATININE 0.92 11/10/2020   CREATININE 1.08 11/09/2020   CREATININE 1.09 11/08/2020  -Resolved  Hypernatremia Lab Results  Component Value Date   NA  140 11/12/2020   NA 141 11/11/2020   NA 141 11/10/2020   NA 143 11/09/2020   NA 142 11/08/2020  -Resolved   #Shock liver/Chronic hepatitis C --LFTs downtrending.  --Has underlying active HCV by chart review (+ viral load in [facility] in 2020).  -LFTs resolved   #Pressure injuries of hip/knee/feet/Burn on left ear --supportive care, wound consult, Turn q2hrs  Hypokalemia - Potassium goal> - 10/27 potassium IV 60 mEq   Hypomagnesmia - Magnesium goal> 2 - 10/27 magnesium IV 4 g  Obese (BMI 28.51kg/m)  Dysphagia - 12/30 spoke with NP Manuela Schwartz, and she spoke with Dr.El-Abd IR and they agreed to place PEG tube in the A.m. -Patient in consult that PEG tube does not fully eliminate his risk of    DVT prophylaxis: Heparin Code Status: Full Family Communication: 10/27 wife at bedside for discussion of plan of care all questions answered Status is: Inpatient    Dispo: The patient is from: SNF              Anticipated d/c is to: SNF              Anticipated d/c date is: > 3 days              Patient currently is not medically stable to d/c.      Consultants:  Palliative care FNP Jordan Hawks   Procedures/Significant Events:  10/27 MBS performed: Patient failed, mild to moderate aspiration risk   I have personally reviewed and interpreted all radiology studies and my findings are as above.  VENTILATOR SETTINGS: Trach blow-by 10/30 Flow 5 L/min FiO2 28% SPO2 96%   Cultures   Antimicrobials: Anti-infectives (From admission, onward)    Start     Ordered Stop   11/06/20 1400  ceFAZolin (ANCEF) IVPB 2g/100 mL premix        11/06/20 0742 11/08/20 1244   11/05/20 1400  ceFAZolin (ANCEF) IVPB 1 g/50 mL premix  Status:  Discontinued        11/05/20 0735 11/06/20 0742   11/03/20 1100  ceFAZolin (ANCEF) IVPB 1 g/50 mL premix  Status:  Discontinued        11/03/20 1006 11/05/20 0735   10/31/20 2200  cefTRIAXone (ROCEPHIN) 1 g in sodium chloride 0.9 % 100 mL IVPB   Status:  Discontinued        10/31/20 0258 11/03/20 1006   10/31/20 2200  azithromycin (ZITHROMAX) 500 mg in sodium chloride 0.9 % 250 mL IVPB        10/31/20 0258 11/03/20 2349   10/31/20 0030  cefTRIAXone (ROCEPHIN) 1 g in sodium chloride 0.9 % 100 mL IVPB        10/31/20 0029 10/31/20 0248   10/31/20 0030  azithromycin (ZITHROMAX) 500 mg in sodium chloride 0.9 % 250 mL IVPB        10/31/20 0029 10/31/20 0248         Devices    LINES / TUBES:  Shiley 6 mm cuffed 10/31/2020>>>    Continuous Infusions:  sodium chloride 250 mL (11/09/20 1740)     Objective: Vitals:   11/11/20 1915 11/12/20 0000 11/12/20 0500 11/12/20 0742  BP: 126/75 120/82  120/73  Pulse: 83 66  78  Resp: 16 20  18   Temp: 98.2 F (36.8 C) 98.2 F (36.8 C)  98.2 F (36.8 C)  TempSrc:  Oral    SpO2: 97% 100%  98%  Weight:   71.5 kg   Height:  Intake/Output Summary (Last 24 hours) at 11/12/2020 0821 Last data filed at 11/12/2020 0000 Gross per 24 hour  Intake 120 ml  Output 400 ml  Net -280 ml    Filed Weights   11/07/20 0600 11/11/20 0446 11/12/20 0500  Weight: 73 kg 69.9 kg 71.5 kg    Examination:  General: Alert, nods yes and no to questions although capable of speaking PMV in place.  Positive acute on chronic respiratory distress (chronic trach), cachectic Eyes: negative scleral hemorrhage, negative anisocoria, negative icterus ENT: Negative Runny nose, negative gingival bleeding, Neck:  Negative scars, masses, torticollis, lymphadenopathy, JVD, #6 cuffed Shiley in place, whitish discharge Lungs: tachypneic coarse breath sounds bilaterally without wheezes or crackles Cardiovascular: Regular rate and rhythm without murmur gallop or rub normal S1 and S2 Abdomen: negative abdominal pain, nondistended, positive soft, bowel sounds, no rebound, no ascites, no appreciable mass Extremities: No significant cyanosis, clubbing, or edema bilateral lower extremities Skin: Negative rashes,  lesions, ulcers Psychiatric: Unable to evaluate secondary to chronic trach Central nervous system:  Cranial nerves II through XII intact, tongue/uvula midline, all extremities muscle strength 5/5, sensation intact throughout, unable to evaluate secondary to chronic trach .     Data Reviewed: Care during the described time interval was provided by me .  I have reviewed this patient's available data, including medical history, events of note, physical examination, and all test results as part of my evaluation.   CBC: Recent Labs  Lab 11/08/20 0847 11/09/20 0444 11/10/20 0530 11/11/20 0512 11/12/20 0518  WBC 12.0* 10.3 9.1 10.8* 7.9  NEUTROABS 10.2* 9.1* 7.8* 9.5* 6.7  HGB 13.4 12.0* 11.8* 12.6* 11.1*  HCT 38.8* 35.9* 35.0* 36.7* 33.0*  MCV 87.6 88.2 87.7 89.5 87.3  PLT 216 179 198 243 032    Basic Metabolic Panel: Recent Labs  Lab 11/08/20 0847 11/09/20 0444 11/10/20 0530 11/11/20 0512 11/12/20 0518  NA 142 143 141 141 140  K 3.1* 3.5 3.8 3.4* 3.6  CL 109 110 112* 110 109  CO2 26 24 24 23 24   GLUCOSE 102* 92 86 95 90  BUN 44* 34* 29* 25* 22  CREATININE 1.09 1.08 0.92 0.93 0.86  CALCIUM 8.5* 8.4* 8.2* 8.5* 8.2*  MG 1.5* 1.6* 2.1 1.8 1.6*  PHOS 3.2 2.7 2.7 3.2 3.1    GFR: Estimated Creatinine Clearance: 80 mL/min (by C-G formula based on SCr of 0.86 mg/dL). Liver Function Tests: Recent Labs  Lab 11/08/20 0847 11/09/20 0444 11/10/20 0530 11/11/20 0512 11/12/20 0518  AST 46* 36 34 36 32  ALT 58* 40 35 33 30  ALKPHOS 66 55 54 63 53  BILITOT 1.0 0.8 0.8 1.1 0.7  PROT 6.5 5.9* 6.1* 6.6 6.0*  ALBUMIN 2.9* 2.3* 2.6* 2.7* 2.4*    No results for input(s): LIPASE, AMYLASE in the last 168 hours. No results for input(s): AMMONIA in the last 168 hours. Coagulation Profile: No results for input(s): INR, PROTIME in the last 168 hours. Cardiac Enzymes: No results for input(s): CKTOTAL, CKMB, CKMBINDEX, TROPONINI in the last 168 hours.  BNP (last 3 results) No  results for input(s): PROBNP in the last 8760 hours. HbA1C: No results for input(s): HGBA1C in the last 72 hours. CBG: Recent Labs  Lab 11/05/20 1616 11/05/20 2330 11/06/20 0304 11/06/20 0739 11/06/20 1236  GLUCAP 123* 130* 110* 113* 152*    Lipid Profile: No results for input(s): CHOL, HDL, LDLCALC, TRIG, CHOLHDL, LDLDIRECT in the last 72 hours. Thyroid Function Tests: No results for input(s): TSH,  T4TOTAL, FREET4, T3FREE, THYROIDAB in the last 72 hours. Anemia Panel: No results for input(s): VITAMINB12, FOLATE, FERRITIN, TIBC, IRON, RETICCTPCT in the last 72 hours. Urine analysis:    Component Value Date/Time   COLORURINE AMBER (A) 10/30/2020 0006   APPEARANCEUR CLOUDY (A) 10/30/2020 0006   LABSPEC 1.014 10/30/2020 0006   PHURINE 5.0 10/30/2020 0006   GLUCOSEU 50 (A) 10/30/2020 0006   HGBUR LARGE (A) 10/30/2020 0006   BILIRUBINUR NEGATIVE 10/30/2020 0006   KETONESUR 5 (A) 10/30/2020 0006   PROTEINUR 100 (A) 10/30/2020 0006   NITRITE NEGATIVE 10/30/2020 0006   LEUKOCYTESUR NEGATIVE 10/30/2020 0006   Sepsis Labs: @LABRCNTIP (procalcitonin:4,lacticidven:4)  ) No results found for this or any previous visit (from the past 240 hour(s)).        Radiology Studies: No results found.      Scheduled Meds:  aspirin  81 mg Per Tube Daily   chlorhexidine gluconate (MEDLINE KIT)  15 mL Mouth Rinse BID   Chlorhexidine Gluconate Cloth  6 each Topical Daily   escitalopram  20 mg Per Tube Daily   heparin injection (subcutaneous)  5,000 Units Subcutaneous Q8H   hydrALAZINE  50 mg Per Tube Q8H   levothyroxine  50 mcg Per Tube Daily   losartan  25 mg Oral Daily   metoprolol tartrate  25 mg Per Tube BID   multivitamin with minerals  1 tablet Oral Daily   pantoprazole (PROTONIX) IV  40 mg Intravenous QHS   Continuous Infusions:  sodium chloride 250 mL (11/09/20 1740)     LOS: 12 days   The patient is critically ill with multiple organ systems failure and requires  high complexity decision making for assessment and support, frequent evaluation and titration of therapies, application of advanced monitoring technologies and extensive interpretation of multiple databases. Critical Care Time devoted to patient care services described in this note  Time spent: 40 minutes     Syvanna Ciolino, Geraldo Docker, MD [facility]   If 7PM-7AM, please contact night-coverage 11/12/2020, 8:21 AM

## 2020-11-12 NOTE — Progress Notes (Signed)
PROGRESS NOTE    Ronald Murphy  YKD:[id number] DOB: [birth year] DOA: 10/30/2020 PCP: Pcp, No   Brief Narrative:  60 y.o. 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 (s/p completion of radiation treatments in June 2019 with chemotherapy begun but truncated secondary to tolerability, known C1 metastasis s/p CyberKnife radiation treatment and chemotherapy, most recently on durvalumab which was started 02/04/2019 with last infusion on 02/23/2020), hypertension, ETOH in remission, anxiety/depression, kidney stones presented to [facility] ED from home via EMS on 10/30/2020 due to his son's concern of altered mental status  Subjective: 10/29 A/O x4 speaking with myself and hospice attempting to make up his mind, between discharging on palliative care or hospice.   Assessment & Plan:  Covid vaccination;  Active Problems:   Chronic hepatitis C without hepatic coma (HCC)   Tracheostomy status (HCC)   Acute metabolic encephalopathy   History of laryngeal cancer   Acute renal failure (HCC)   NSTEMI (non-ST elevated myocardial infarction) (HCC)   Small cell carcinoma of lung (HCC)   Radiation-induced esophageal stricture   Chronic, continuous use of opioids   Chronic hyponatremia   Acute hepatitis   Acute respiratory failure (HCC)   Altered mental status   Pressure injury of skin   Protein-calorie malnutrition, severe   Pneumonia due to methicillin susceptible Staphylococcus aureus (MSSA) ([place])   Hypernatremia   #NSTEMI --[place] cards following with plans for conservative medical management.  --Completed 48hrs of heparin gtt. --ASA/statin started.  --Cardiology will consider DAPT prior to discharge--messaged Dr K--ok to start po plavix   Acute on chronic hypoxic and hypercapnic respiratory failure -Trach dependent - Currently Shiley 6 mm cuffed 10/31/2020>>> -- patient currently on trach collar --10/24-- plan to change to passi muir valve --10/25--pt is doing well. Able to  talk. 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 In context of acute illness/decompensation, recommend continuing dysphagia 1 (puree) and nectar thick liquids with strict precautions/compensations, including: upright for all PO intake; chin tuck with all swallows. Hard cough after liquids to clear penetration. Double swallow for solids, follow with sip of liquid. Slow rate, small bites and sips -ADDENDUM 10/29 patient no longer wants to discharge on home hospice.  Would like to be considered for PEG tube placement and then discharged on home palliative care.  #Septic Shock 2/2 positive MSSA PNA (resolved) --now weaned entirely from mechanical ventilation --on IV Day cefazolin. Previously completed 2 days of ceftriaxone prior to culture data finalization --Discontinue solumedrol --Continue bronchodilators for now        #Acute metabolic encephalopathy -Multifactorial: Most likely secondary to acute respiratory failure, septic shock, MSSA pneumonia -Underlying causes treated.  Has resolved  #Bilateral basal ganglia lacunar infarct-new since May 2022 --Stat telemetry neuro consult obtained to look over imaging and receive recommendations as patient is receiving high-dose heparin for N-STEMI. Per telemetry neurologist infarcts appear chronic on imaging -- continue aspirin   #Acute kidney injury 2/2 Rhabdomyolysis Lab Results  Component Value Date   CREATININE 0.86 11/12/2020   CREATININE 0.93 11/11/2020   CREATININE 0.92 11/10/2020   CREATININE 1.08 11/09/2020   CREATININE 1.09 11/08/2020   -  Resolved  Hypernatremia Lab Results  Component Value Date   NA 140 11/12/2020   NA 141 11/11/2020   NA 141 11/10/2020   NA 143 11/09/2020   NA 142 11/08/2020  -Resolved   #Shock liver/Chronic hepatitis C --LFTs downtrending.  --Has underlying active HCV by chart review (+ viral load in [facility] in 2020).  -LFTs resolved   #Pressure injuries of hip/knee/feet/Burn on left ear --supportive care, wound consult, Turn q2hrs  Hypokalemia - Potassium goal> - 10/27 potassium IV 60 mEq   Hypomagnesmia - Magnesium goal> 2 - 10/27 magnesium IV 4 g  Obese (BMI 28.51kg/m)    DVT prophylaxis: Heparin Code Status: Full Family Communication: 10/27 wife at bedside for discussion of plan of care all questions answered Status is: Inpatient    Dispo: The patient is from: SNF              Anticipated d/c is to: SNF              Anticipated d/c date is: > 3 days              Patient currently is not medically stable to d/c.      Consultants:  Palliative care FNP Jordan Hawks   Procedures/Significant Events:  10/27 MBS performed: Patient failed, mild to moderate aspiration risk   I have personally reviewed and interpreted all radiology studies and my findings are as above.  VENTILATOR SETTINGS: 10/26 FiO2 28% SPO2 97%   Cultures   Antimicrobials: Anti-infectives (From admission, onward)    Start     Ordered Stop   11/06/20 1400  ceFAZolin (ANCEF) IVPB 2g/100 mL premix        11/06/20 0742 11/08/20 1244   11/05/20 1400  ceFAZolin (ANCEF) IVPB 1 g/50 mL premix  Status:  Discontinued        11/05/20 0735 11/06/20 0742   11/03/20 1100  ceFAZolin (ANCEF) IVPB 1 g/50 mL premix  Status:  Discontinued        11/03/20 1006 11/05/20 0735   10/31/20 2200  cefTRIAXone (ROCEPHIN) 1 g in sodium chloride 0.9 % 100 mL IVPB  Status:  Discontinued        10/31/20 0258 11/03/20 1006   10/31/20 2200  azithromycin (ZITHROMAX) 500 mg in sodium chloride 0.9 % 250 mL IVPB         10/31/20 0258 11/03/20 2349   10/31/20 0030  cefTRIAXone (ROCEPHIN) 1 g in sodium chloride 0.9 % 100 mL IVPB        10/31/20 0029 10/31/20 0248   10/31/20 0030  azithromycin (ZITHROMAX) 500 mg in sodium chloride 0.9 % 250 mL IVPB        10/31/20 0029 10/31/20 0248         Devices    LINES / TUBES:  Shiley 6 mm cuffed 10/31/2020>>>    Continuous Infusions:  sodium chloride 250 mL (11/09/20 1740)     Objective: Vitals:   11/11/20 1915 11/12/20 0000 11/12/20 0500 11/12/20 0742  BP: 126/75 120/82  120/73  Pulse: 83 66  78  Resp: _0 Temp: 98.2 F (36.8 C) 98.2 F (36.8 C)  98.2 F (36.8 C)  TempSrc:  Oral    SpO2: 97% 100%  98%  Weight:   71.5 kg   Height:        Intake/Output Summary (Last 24 hours) at 11/12/2020 0751 Last data filed at 11/12/2020 0000 Gross per 24 hour  Intake 120 ml  Output  400 ml  Net -280 ml    Filed Weights   11/07/20 0600 11/11/20 0446 11/12/20 0500  Weight: 73 kg 69.9 kg 71.5 kg    Examination:  General: Alert, nods yes and no to questions positive acute on chronic respiratory distress (chronic trach), cachectic Eyes: negative scleral hemorrhage, negative anisocoria, negative icterus ENT: Negative Runny nose, negative gingival bleeding, Neck:  Negative scars, masses, torticollis, lymphadenopathy, JVD, #6 cuffed Shiley in place, whitish discharge Lungs: coarse breath sounds bilaterally without wheezes or crackles Cardiovascular: Regular rate and rhythm without murmur gallop or rub normal S1 and S2 Abdomen: negative abdominal pain, nondistended, positive soft, bowel sounds, no rebound, no ascites, no appreciable mass Extremities: No significant cyanosis, clubbing, or edema bilateral lower extremities Skin: Negative rashes, lesions, ulcers Psychiatric: Unable to evaluate secondary to chronic trach Central nervous system:  Cranial nerves II through XII intact, tongue/uvula midline, all extremities muscle strength 5/5, sensation  intact throughout, unable to evaluate secondary to chronic trach .     Data Reviewed: Care during the described time interval was provided by me .  I have reviewed this patient's available data, including medical history, events of note, physical examination, and all test results as part of my evaluation.   CBC: Recent Labs  Lab 11/08/20 0847 11/09/20 0444 11/10/20 0530 11/11/20 0512 11/12/20 0518  WBC 12.0* 10.3 9.1 10.8* 7.9  NEUTROABS 10.2* 9.1* 7.8* 9.5* 6.7  HGB 13.4 12.0* 11.8* 12.6* 11.1*  HCT 38.8* 35.9* 35.0* 36.7* 33.0*  MCV 87.6 88.2 87.7 89.5 87.3  PLT 216 179 198 243 003    Basic Metabolic Panel: Recent Labs  Lab 11/08/20 0847 11/09/20 0444 11/10/20 0530 11/11/20 0512 11/12/20 0518  NA 142 143 141 141 140  K 3.1* 3.5 3.8 3.4* 3.6  CL 109 110 112* 110 109  CO2 _0 GLUCOSE 102* 92 86 95 90  BUN 44* 34* 29* 25* 22  CREATININE 1.09 1.08 0.92 0.93 0.86  CALCIUM 8.5* 8.4* 8.2* 8.5* 8.2*  MG 1.5* 1.6* 2.1 1.8 1.6*  PHOS 3.2 2.7 2.7 3.2 3.1    GFR: Estimated Creatinine Clearance: 80 mL/min (by C-G formula based on SCr of 0.86 mg/dL). Liver Function Tests: Recent Labs  Lab 11/08/20 0847 11/09/20 0444 11/10/20 0530 11/11/20 0512 11/12/20 0518  AST 46* 36 34 36 32  ALT 58* 40 35 33 30  ALKPHOS 66 55 54 63 53  BILITOT 1.0 0.8 0.8 1.1 0.7  PROT 6.5 5.9* 6.1* 6.6 6.0*  ALBUMIN 2.9* 2.3* 2.6* 2.7* 2.4*    No results for input(s): LIPASE, AMYLASE in the last 168 hours. No results for input(s): AMMONIA in the last 168 hours. Coagulation Profile: No results for input(s): INR, PROTIME in the last 168 hours. Cardiac Enzymes: No results for input(s): CKTOTAL, CKMB, CKMBINDEX, TROPONINI in the last 168 hours.  BNP (last 3 results) No results for input(s): PROBNP in the last 8760 hours. HbA1C: No results for input(s): HGBA1C in the last 72 hours. CBG: Recent Labs  Lab 11/05/20 1616 11/05/20 2330 11/06/20 0304 11/06/20 0739 11/06/20 1236   GLUCAP 123* 130* 110* 113* 152*    Lipid Profile: No results for input(s): CHOL, HDL, LDLCALC, TRIG, CHOLHDL, LDLDIRECT in the last 72 hours. Thyroid Function Tests: No results for input(s): TSH, T4TOTAL, FREET4, T3FREE, THYROIDAB in the last 72 hours. Anemia Panel: No results for input(s): VITAMINB12, FOLATE, FERRITIN, TIBC, IRON, RETICCTPCT in the last 72 hours. Urine analysis:    Component Value  Date/Time   COLORURINE AMBER (A) 10/30/2020 0006   APPEARANCEUR CLOUDY (A) 10/30/2020 0006   LABSPEC 1.014 10/30/2020 0006   PHURINE 5.0 10/30/2020 0006   GLUCOSEU 50 (A) 10/30/2020 0006   HGBUR LARGE (A) 10/30/2020 0006   BILIRUBINUR NEGATIVE 10/30/2020 0006   KETONESUR 5 (A) 10/30/2020 0006   PROTEINUR 100 (A) 10/30/2020 0006   NITRITE NEGATIVE 10/30/2020 0006   LEUKOCYTESUR NEGATIVE 10/30/2020 0006   Sepsis Labs: _0 (procalcitonin:4,lacticidven:4)  ) No results found for this or any previous visit (from the past 240 hour(s)).        Radiology Studies: No results found.      Scheduled Meds:  aspirin  81 mg Per Tube Daily   chlorhexidine gluconate (MEDLINE KIT)  15 mL Mouth Rinse BID   Chlorhexidine Gluconate Cloth  6 each Topical Daily   escitalopram  20 mg Per Tube Daily   heparin injection (subcutaneous)  5,000 Units Subcutaneous Q8H   hydrALAZINE  50 mg Per Tube Q8H   levothyroxine  50 mcg Per Tube Daily   losartan  25 mg Oral Daily   metoprolol tartrate  25 mg Per Tube BID   multivitamin with minerals  1 tablet Oral Daily   pantoprazole (PROTONIX) IV  40 mg Intravenous QHS   Continuous Infusions:  sodium chloride 250 mL (11/09/20 1740)     LOS: 12 days   The patient is critically ill with multiple organ systems failure and requires high complexity decision making for assessment and support, frequent evaluation and titration of therapies, application of advanced monitoring technologies and extensive interpretation of multiple databases. Critical  Care Time devoted to patient care services described in this note  Time spent: 40 minutes     Ronald Murphy, Geraldo Docker, MD [facility]   If 7PM-7AM, please contact night-coverage 11/12/2020, 7:51 AM

## 2020-11-12 NOTE — Progress Notes (Signed)
First time as pt's nurse last night and noted the left ear looks black, after a blister popped.  Awating to hear from day shift if wound would be able to assess patient, but there was no order. Order placed to be seen Monday for further treatment. Informed NP about the patient's ear.  Bridgette Habermann DNP RN [phone] PM

## 2020-11-13 ENCOUNTER — Encounter: Payer: Self-pay | Admitting: Internal Medicine

## 2020-11-13 ENCOUNTER — Inpatient Hospital Stay: Payer: Medicare HMO | Admitting: Radiology

## 2020-11-13 HISTORY — PX: IR GASTROSTOMY TUBE MOD SED: IMG625

## 2020-11-13 LAB — CBC WITH DIFFERENTIAL/PLATELET
Abs Immature Granulocytes: 0.02 10*3/uL (ref 0.00–0.07)
Basophils Absolute: 0 10*3/uL (ref 0.0–0.1)
Basophils Relative: 0 %
Eosinophils Absolute: 0.1 10*3/uL (ref 0.0–0.5)
Eosinophils Relative: 1 %
HCT: 33.3 % — ABNORMAL LOW (ref 39.0–52.0)
Hemoglobin: 11.2 g/dL — ABNORMAL LOW (ref 13.0–17.0)
Immature Granulocytes: 0 %
Lymphocytes Relative: 11 %
Lymphs Abs: 0.7 10*3/uL (ref 0.7–4.0)
MCH: 29.8 pg (ref 26.0–34.0)
MCHC: 33.6 g/dL (ref 30.0–36.0)
MCV: 88.6 fL (ref 80.0–100.0)
Monocytes Absolute: 0.3 10*3/uL (ref 0.1–1.0)
Monocytes Relative: 5 %
Neutro Abs: 5.2 10*3/uL (ref 1.7–7.7)
Neutrophils Relative %: 83 %
Platelets: 223 10*3/uL (ref 150–400)
RBC: 3.76 MIL/uL — ABNORMAL LOW (ref 4.22–5.81)
RDW: 15.4 % (ref 11.5–15.5)
WBC: 6.3 10*3/uL (ref 4.0–10.5)
nRBC: 0 % (ref 0.0–0.2)

## 2020-11-13 LAB — COMPREHENSIVE METABOLIC PANEL
ALT: 26 U/L (ref 0–44)
AST: 28 U/L (ref 15–41)
Albumin: 2.4 g/dL — ABNORMAL LOW (ref 3.5–5.0)
Alkaline Phosphatase: 51 U/L (ref 38–126)
Anion gap: 6 (ref 5–15)
BUN: 19 mg/dL (ref 8–23)
CO2: 24 mmol/L (ref 22–32)
Calcium: 8.2 mg/dL — ABNORMAL LOW (ref 8.9–10.3)
Chloride: 108 mmol/L (ref 98–111)
Creatinine, Ser: 0.87 mg/dL (ref 0.61–1.24)
GFR, Estimated: >60 mL/min (ref >60)
Glucose, Bld: 88 mg/dL (ref 70–99)
Potassium: 3.7 mmol/L (ref 3.5–5.1)
Sodium: 138 mmol/L (ref 135–145)
Total Bilirubin: 0.8 mg/dL (ref 0.3–1.2)
Total Protein: 5.9 g/dL — ABNORMAL LOW (ref 6.5–8.1)

## 2020-11-13 LAB — PROTIME-INR
INR: 1.1 (ref 0.8–1.2)
Prothrombin Time: 14.3 seconds (ref 11.4–15.2)

## 2020-11-13 LAB — MAGNESIUM: Magnesium: 1.5 mg/dL — ABNORMAL LOW (ref 1.7–2.4)

## 2020-11-13 LAB — PHOSPHORUS: Phosphorus: 3.1 mg/dL (ref 2.5–4.6)

## 2020-11-13 IMAGING — XA IR PERC PLACEMENT GASTROSTOMY
1 series · 6 of 6 positions shown · non-contrast
Comparison: none

INDICATION: 60-year-old gentleman with history of laryngeal malignancy and
dysphagia presents to IR for percutaneous gastrostomy tube
placement.

[Series 6: interv standard · 6 of 6 slices shown]
[im 1/6]
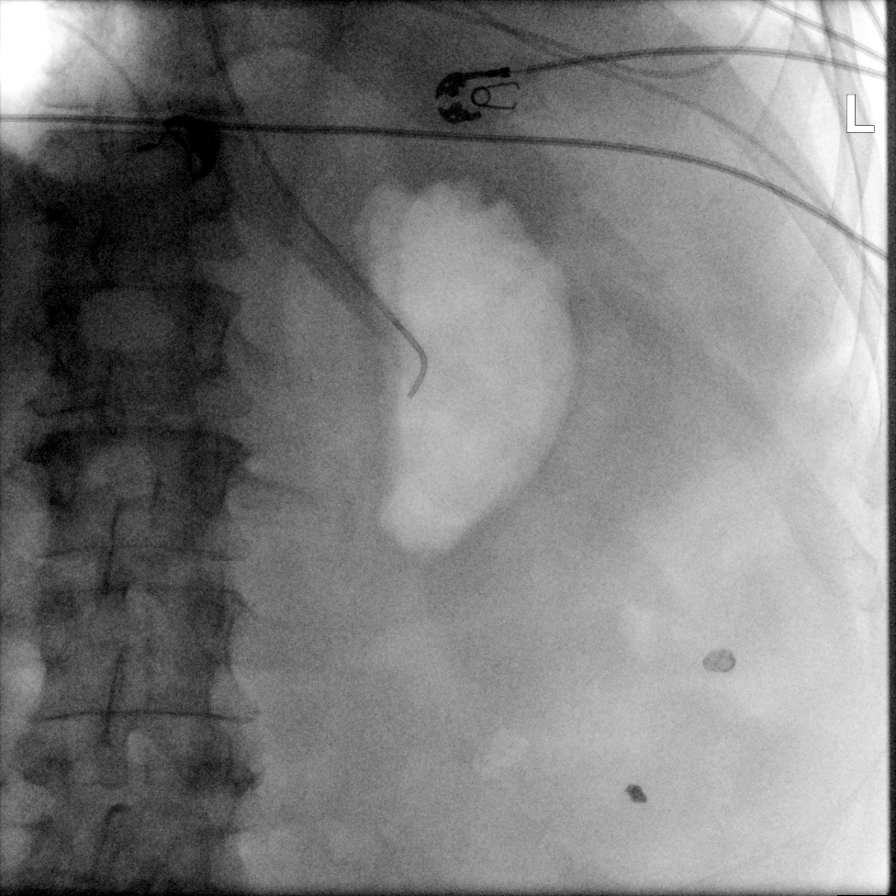
[im 2/6]
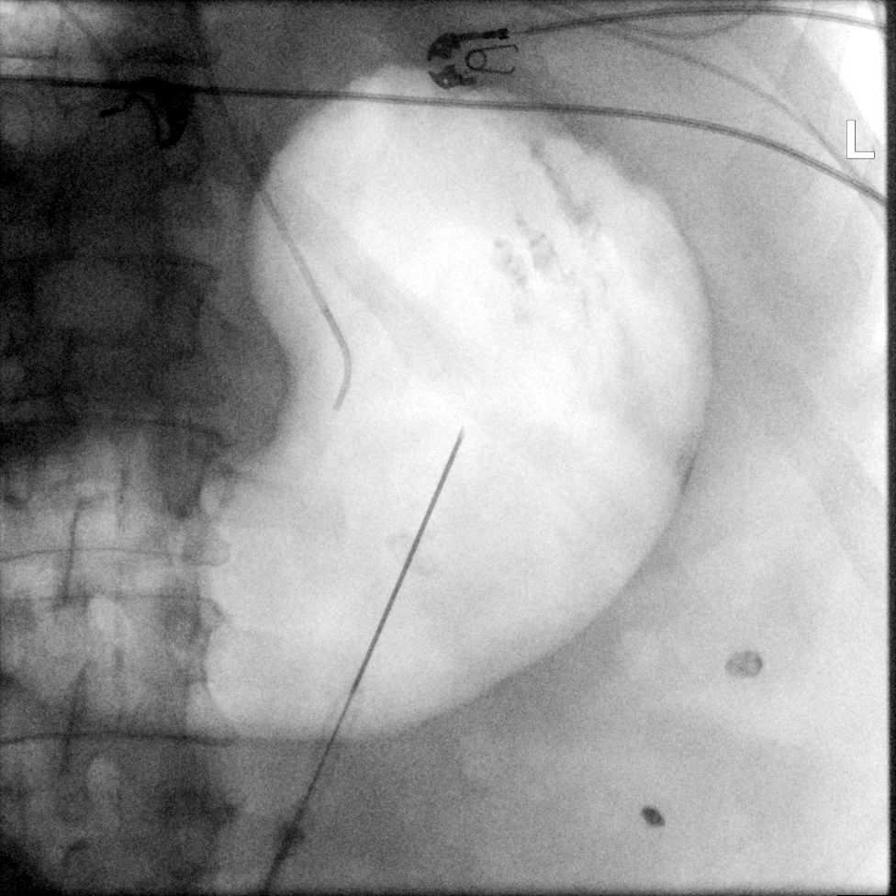
[im 3/6]
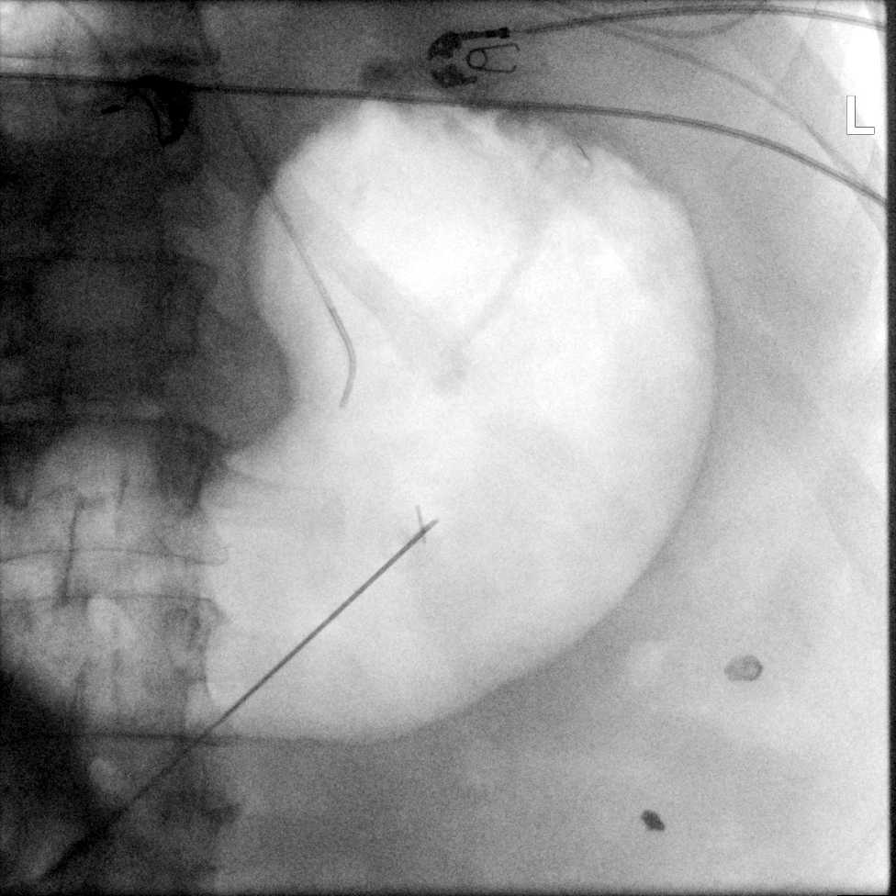
[im 4/6]
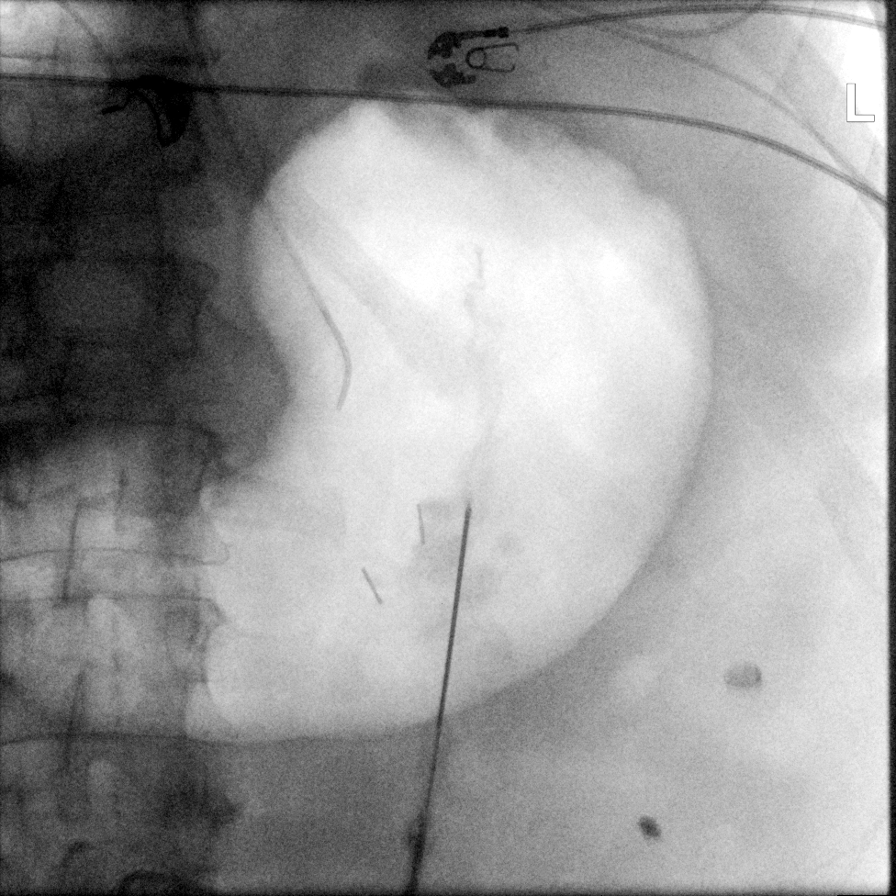
[im 5/6]
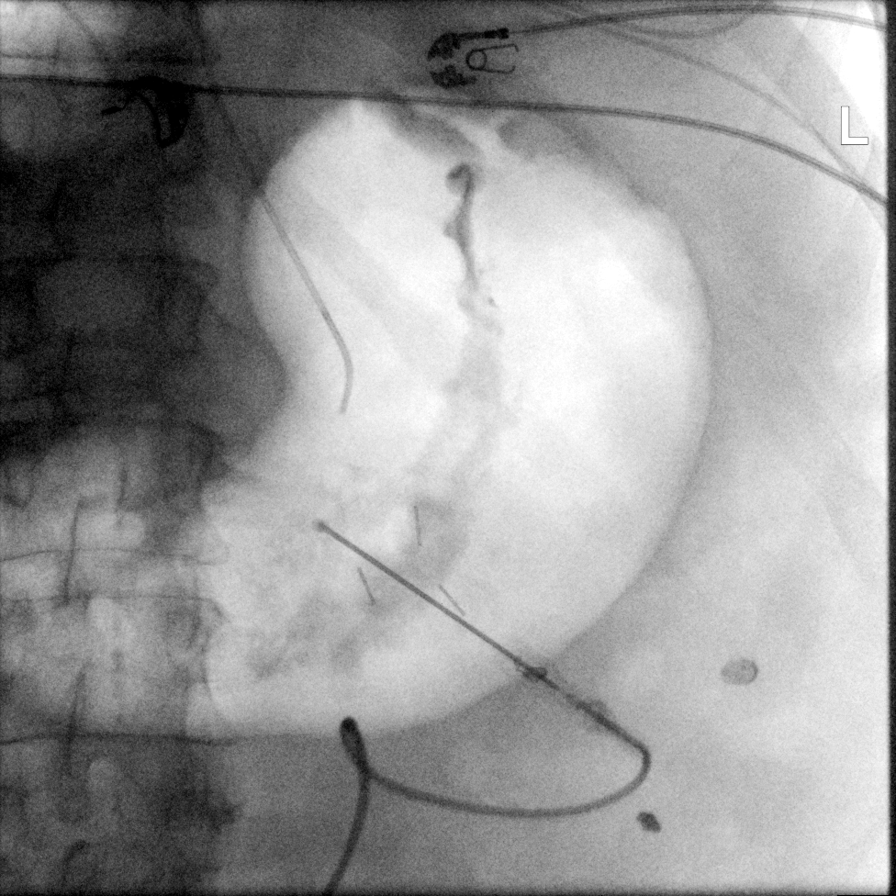
[im 6/6]
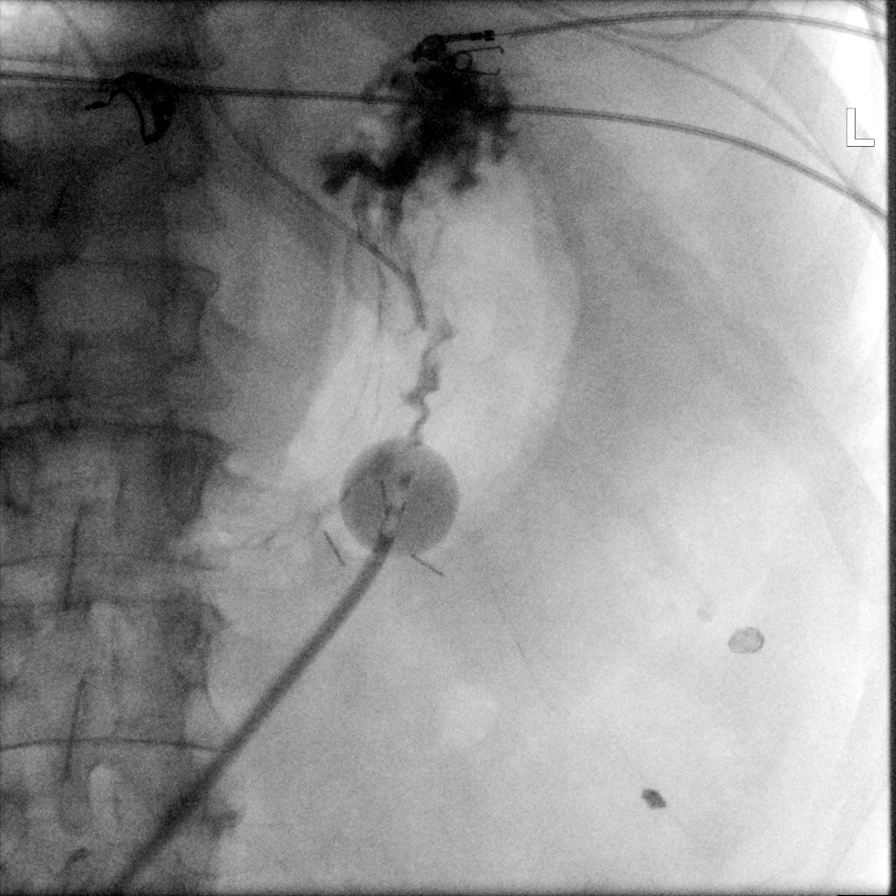

[6 of 6 positions shown; findings below may reference images not displayed]

EXAM:
Ultrasound and fluoroscopy guided percutaneous gastrostomy tube
placement

MEDICATIONS:
Ancef 2 g IV; Antibiotics were administered within 1 hour of the
procedure. Glucagon 1 mg IV

ANESTHESIA/SEDATION:
Moderate (conscious) sedation was employed during this procedure. A
total of Versed 0.5 mg and Fentanyl 25 mcg was administered
intravenously by the radiology nurse.

Total intra-service moderate Sedation Time: 27 minutes. The
patient's level of consciousness and vital signs were monitored
continuously by radiology nursing throughout the procedure under my
direct supervision.

CONTRAST:  8 mL Omnipaque 350-administered into the gastric lumen.

FLUOROSCOPY TIME:  Fluoroscopy Time: 2 minutes 0 seconds (15.5 mGy).

COMPLICATIONS:
None immediate.

PROCEDURE:
Informed written consent was obtained from the patient after a
thorough discussion of the procedural risks, benefits and
alternatives. All questions were addressed. Maximal Sterile Barrier
Technique was utilized including caps, mask, sterile gowns, sterile
gloves, sterile drape, hand hygiene and skin antiseptic. A timeout
was performed prior to the initiation of the procedure.

The left hepatic lobe margin was marked utilizing ultrasound
guidance.

The epigastric region was prepped and draped in the usual sterile
fashion.

The stomach was insufflated utilizing the OG tube.

Following local lidocaine administration, three gastropexies were
placed to secure the anterior wall of the stomach to the anterior
abdominal wall. Percutaneous access obtained into the gastric body
at the center of the gastropexies with an 18 gauge needle. Guide
wire advanced into the gastric lumen.

Gastrostomy tube placed on the guidewire.

The G tube retention balloon was inflated with 10 mL of dilute
contrast and retracted to the anterior gastric wall. Contrast
administrated through the gastrostomy tube opacified the gastric
lumen.

The insertion site was covered with sterile dressing.
IMPRESSION: Placement of 18 French percutaneous gastrostomy tube as above.

## 2020-11-13 MED ORDER — MAGNESIUM SULFATE 4 GM/100ML IV SOLN
4.0000 g | Freq: Once | INTRAVENOUS | Status: AC
  Administered 2020-11-13: 4 g via INTRAVENOUS
  Filled 2020-11-13: qty 100

## 2020-11-13 MED ORDER — GLUCAGON HCL RDNA (DIAGNOSTIC) 1 MG IJ SOLR
INTRAMUSCULAR | Status: DC | PRN
  Administered 2020-11-13: 1 mg via INTRAVENOUS

## 2020-11-13 MED ORDER — FENTANYL CITRATE (PF) 100 MCG/2ML IJ SOLN
INTRAMUSCULAR | Status: DC | PRN
  Administered 2020-11-13: 25 ug via INTRAVENOUS

## 2020-11-13 MED ORDER — LOSARTAN POTASSIUM 25 MG PO TABS
25.0000 mg | ORAL_TABLET | Freq: Every day | ORAL | Status: DC
  Administered 2020-11-14 – 2020-11-15 (×2): 25 mg
  Filled 2020-11-13 (×2): qty 1

## 2020-11-13 MED ORDER — IOHEXOL 350 MG/ML SOLN
8.0000 mL | Freq: Once | INTRAVENOUS | Status: AC | PRN
  Administered 2020-11-13: 8 mL

## 2020-11-13 MED ORDER — LIDOCAINE VISCOUS HCL 2 % MT SOLN
OROMUCOSAL | Status: AC
  Filled 2020-11-13: qty 15

## 2020-11-13 MED ORDER — ADULT MULTIVITAMIN W/MINERALS CH
1.0000 | ORAL_TABLET | Freq: Every day | ORAL | Status: DC
  Administered 2020-11-14 – 2020-11-15 (×2): 1
  Filled 2020-11-13 (×2): qty 1

## 2020-11-13 MED ORDER — LIDOCAINE HCL 1 % IJ SOLN
INTRAMUSCULAR | Status: AC
  Administered 2020-11-13: 5 mL
  Filled 2020-11-13: qty 20

## 2020-11-13 MED ORDER — GLUCAGON HCL RDNA (DIAGNOSTIC) 1 MG IJ SOLR
INTRAMUSCULAR | Status: AC
  Filled 2020-11-13: qty 1

## 2020-11-13 MED ORDER — FENTANYL CITRATE (PF) 100 MCG/2ML IJ SOLN
INTRAMUSCULAR | Status: AC
  Filled 2020-11-13: qty 2

## 2020-11-13 MED ORDER — MIDAZOLAM HCL 2 MG/2ML IJ SOLN
INTRAMUSCULAR | Status: AC
  Filled 2020-11-13: qty 2

## 2020-11-13 MED ORDER — MIDAZOLAM HCL 2 MG/2ML IJ SOLN
INTRAMUSCULAR | Status: DC | PRN
  Administered 2020-11-13: .5 mg via INTRAVENOUS

## 2020-11-13 MED ORDER — HEPARIN SODIUM (PORCINE) 5000 UNIT/ML IJ SOLN
5000.0000 [IU] | Freq: Three times a day (TID) | INTRAMUSCULAR | Status: DC
  Administered 2020-11-13 – 2020-11-15 (×6): 5000 [IU] via SUBCUTANEOUS
  Filled 2020-11-13 (×6): qty 1

## 2020-11-13 MED ORDER — CEFAZOLIN SODIUM-DEXTROSE 2-4 GM/100ML-% IV SOLN: 2.0000 g | INTRAVENOUS | Status: AC

## 2020-11-13 MED ORDER — CEFAZOLIN SODIUM-DEXTROSE 1-4 GM/50ML-% IV SOLN
INTRAVENOUS | Status: DC | PRN
  Administered 2020-11-13: 2 g via INTRAVENOUS

## 2020-11-13 MED ORDER — WHITE PETROLATUM EX OINT
TOPICAL_OINTMENT | Freq: Two times a day (BID) | CUTANEOUS | Status: DC
  Administered 2020-11-13 – 2020-11-15 (×4): 1 via TOPICAL
  Filled 2020-11-13 (×6): qty 5

## 2020-11-13 NOTE — TOC Progression Note (Signed)
Transition of Care [facility]) - Progression Note    Patient Details  Name: Ronald Murphy MRN: [id number] Date of Birth: [birth year]  [facility] CM/SW [place], [place] Phone Number: 11/13/2020, 1:54 PM  Clinical Narrative:      CSW informed Apolonio Schneiders with [employer] at [email or web address] reports that patient is getting peg. She is working on identifying a home health agency that will accept him. Reports she will continue to follow up.      Expected Discharge Plan and Services                                                 Social Determinants of Health (SDOH) Interventions    Readmission Risk Interventions Readmission Risk Prevention Plan 11/03/2020  Transportation Screening Complete  Medication Review Press photographer) Not Complete  Med Review Comments Spoke with Son who had to hang up to take care of personal business.  PCP or Specialist appointment within 3-5 days of discharge Not Complete  PCP/Specialist Appt Not Complete comments NMS for discharge  [place] or [place] Complete  SW Recovery Care/Counseling Consult Complete  Palliative Care Screening Not Applicable  Skilled Nursing Facility Complete  Some recent data might be hidden

## 2020-11-13 NOTE — Progress Notes (Signed)
Physical Therapy Treatment Patient Details Name: Ronald Murphy MRN: [id number] DOB: 05-28-1960 Today's Date: 11/13/2020   History of Present Illness Pt is a 60 y/o M who presented to the ED from home on 10/30/20 due to his son's concern of AMS. Pt was admitted to the ICU on 10/18 & treated for severe transaminitis, possible sepsis, NTSEMI, AKI with severe hyperkalemia, and acute AMS. Pt found to have Tracheal aspirate with Staph aureus. PMH: laryngeal CA s/p tracheostomy, Hep C, alcoholism in remission, anxiety, depression, HTN    PT Comments    Pt seen for PT tx with pt received in bed but agreeable. Pt is able to complete bed mobility with supervision but HOB elevated & with use of bed rails. Pt is able to complete transfers with cuing for hand placement & ambulates into hallway with RW & CGA before requiring rest break 2/2 fatigue. Pt is making good improvement with functional mobility but would benefit from ongoing acute PT services to increase endurance, strength, activity tolerance, and balance.  Via trach collar: 5L/min, 28% FiO2 at rest 4L/min, 30% FiO2 during gait    Recommendations for follow up therapy are one component of a multi-disciplinary discharge planning process, led by the attending physician.  Recommendations may be updated based on patient status, additional functional criteria and insurance authorization.  Follow Up Recommendations  Home health PT     Assistance Recommended at Discharge Frequent or constant Supervision/Assistance  Equipment Recommendations  Rolling walker (2 wheels);3in1 (PT)    Recommendations for Other Services       Precautions / Restrictions Precautions Precautions: Fall Precaution Comments: chronic trach on 5L O2 Restrictions Weight Bearing Restrictions: No     Mobility  Bed Mobility Overal bed mobility: Needs Assistance Bed Mobility: Supine to Sit     Supine to sit: Supervision;HOB elevated     General bed mobility  comments: use of bed rails to assist with supine>sit    Transfers Overall transfer level: Needs assistance Equipment used: Rolling walker (2 wheels) Transfers: Sit to/from Stand Sit to Stand: Min guard (cuing for safe hand placement during sit<>stand)                Ambulation/Gait Ambulation/Gait assistance: Min guard Gait Distance (Feet): 45 Feet   Gait Pattern/deviations: Trunk flexed;Decreased step length - left;Decreased step length - right;Decreased stride length Gait velocity: decreased   General Gait Details: Pt able to ambulate out in the hallway with RW.   Stairs             Wheelchair Mobility    Modified Rankin (Stroke Patients Only)       Balance Overall balance assessment: Needs assistance Sitting-balance support: Feet supported Sitting balance-Leahy Scale: Good Sitting balance - Comments: sat EOB unsupported without assistance   Standing balance support: During functional activity;Bilateral upper extremity supported Standing balance-Leahy Scale: Fair Standing balance comment: BUE support on RW with CGA                            Cognition Arousal/Alertness: Awake/alert Behavior During Therapy: WFL for tasks assessed/performed;Flat affect Overall Cognitive Status: Impaired/Different from baseline                         Following Commands: Follows one step commands with increased time Safety/Judgement: Decreased awareness of safety;Decreased awareness of deficits Awareness: Intellectual            Exercises  General Comments        Pertinent Vitals/Pain Pain Assessment: Faces Faces Pain Scale: No hurt    Home Living                          Prior Function            PT Goals (current goals can now be found in the care plan section) Acute Rehab PT Goals Patient Stated Goal: none stated PT Goal Formulation: With patient Time For Goal Achievement: 11/18/20 Potential to Achieve Goals:  Fair    Frequency    Min 2X/week      PT Plan Current plan remains appropriate    Co-evaluation              AM-PAC PT "6 Clicks" Mobility   Outcome Measure  Help needed turning from your back to your side while in a flat bed without using bedrails?: A Little Help needed moving from lying on your back to sitting on the side of a flat bed without using bedrails?: A Little Help needed moving to and from a bed to a chair (including a wheelchair)?: A Little Help needed standing up from a chair using your arms (e.g., wheelchair or bedside chair)?: A Little Help needed to walk in hospital room?: A Little Help needed climbing 3-5 steps with a railing? : A Lot 6 Click Score: 17    End of Session Equipment Utilized During Treatment: Oxygen;Gait belt (trach collar) Activity Tolerance: Patient tolerated treatment well Patient left: in chair;with chair alarm set;with call bell/phone within reach Nurse Communication:  (condom catheter falling off) PT Visit Diagnosis: Unsteadiness on feet (R26.81);Muscle weakness (generalized) (M62.81);Difficulty in walking, not elsewhere classified (R26.2)     Time: 0768-0881 PT Time Calculation (min) (ACUTE ONLY): 18 min  Charges:  $Therapeutic Activity: 8-22 mins                     Ronald Murphy, PT, DPT 11/13/20, 10:17 AM    Ronald Murphy 11/13/2020, 10:16 AM

## 2020-11-13 NOTE — Consult Note (Signed)
Chief Complaint: Patient was seen in consultation today for dysphagia and aspiration risk at the request of Allie Bossier, MD  Referring Physician(s): Allie Bossier, MD  Supervising Physician: Mir, Sharen Heck  Patient Status: [facility] - In-pt  History of Present Illness: Ronald Murphy is a 60 y.o. male with PMHx significant for Laryngeal cancer s/p tracheostomy, radiation and chemotherapy. Patient presented to ED 10/17 after son had concern given altered mental status. Patient found to have septic shock with multiorgan failure involving liver, kidneys, cardiac with hypoxic respiratory failure and NSTEMI. The patient was treated with IV antibiotics and IV Heparin with improvement. He has significant dysphagia and aspiration risk and was evaluated by speech pathology with recommendation for dysphagia 1 diet. IR has been consulted for gastrostomy tube placement. Patient denies any current chest pain or abdominal pain. He denies any known complications to sedation and denies any previous gastric surgeries.   Past Medical History:  Diagnosis Date   Alcoholism in remission [facility])    Anxiety    Depression    Hepatitis C    Hypertension    Kidney stone    Larynx cancer ([place]) 04/05/2017   Stage 3    Past Surgical History:  Procedure Laterality Date   SPINE SURGERY  1991    Allergies: Patient has no known allergies.  Medications: Prior to Admission medications   Medication Sig Start Date End Date Taking? Authorizing Provider  EUTHYROX 50 MCG tablet Take 50 mcg by mouth daily. 09/25/20  Yes [provider]  gabapentin (NEURONTIN) 300 MG capsule Take 300 mg by mouth at bedtime.   Yes [provider]  mirtazapine (REMERON) 45 MG tablet Take 45 mg by mouth at bedtime. 10/23/20  Yes [provider]  morphine (MS CONTIN) 30 MG 12 hr tablet Take 30-60 mg by mouth 2 (two) times daily. Take 30 mg by mouth every morning and 60 mg at bedtime 10/05/20  Yes [provider]  oxyCODONE (ROXICODONE) 15 MG immediate release tablet Take 15 mg by mouth every 4 (four) hours as needed for pain. 10/05/20  Yes [provider]  traZODone (DESYREL) 50 MG tablet Take 50-100 mg by mouth at bedtime. 09/16/20  Yes [provider]  ondansetron (ZOFRAN) 8 MG tablet Take by mouth every 8 (eight) hours as needed for nausea or vomiting.    [provider]  prochlorperazine (COMPAZINE) 10 MG tablet Take 10 mg by mouth every 6 (six) hours as needed for nausea or vomiting.    [provider]     Family History  Adopted: Yes    Social History   Socioeconomic History   Marital status: Married    Spouse name: Not on file   Number of children: Not on file   Years of education: Not on file   Highest education level: Not on file  Occupational History   Not on file  Tobacco Use   Smoking status: Former    Types: Cigarettes    Quit date: 2015    Years since quitting: 7.8   Smokeless tobacco: Never  Vaping Use   Vaping Use: Never used  Substance and Sexual Activity   Alcohol use: Not Currently    Comment: quit 2015, prior use 12-15 beverages / day   Drug use: Not Currently    Types: Marijuana   Sexual activity: Not Currently  Other Topics Concern   Not on file  Social History Narrative   Not on file   Social  Determinants of Health   Financial Resource Strain: Not on file  Food Insecurity: Not on file  Transportation Needs: Not on file  Physical Activity: Not on file  Stress: Not on file  Social Connections: Not on file   Review of Systems: A 12 point ROS discussed and pertinent positives are indicated in the HPI above.  All other systems are negative.  Review of Systems  Vital Signs: BP 140/90 (BP Location: Left Arm)   Pulse 90   Temp 98 F (36.7 C)   Resp 16   Ht 5\' 3"  (1.6 m)   Wt 158 lb 8.2 oz (71.9 kg)   SpO2 98%   BMI 28.08 kg/m   Physical Exam Constitutional:      Appearance: He is ill-appearing.      Comments: Tracheostomy intact speaking with PMV  Cardiovascular:     Rate and Rhythm: Normal rate and regular rhythm.  Pulmonary:     Effort: Pulmonary effort is normal.     Comments: Coarse BS bilaterally Abdominal:     General: Abdomen is flat. There is no distension.     Palpations: Abdomen is soft.     Tenderness: There is no abdominal tenderness.  Neurological:     Mental Status: He is alert and oriented to person, place, and time.    Imaging: CT Abdomen Pelvis Wo Contrast  Result Date: 10/31/2020 CLINICAL DATA:  Abdominal pain EXAM: CT ABDOMEN AND PELVIS WITHOUT CONTRAST TECHNIQUE: Multidetector CT imaging of the abdomen and pelvis was performed following the standard protocol without IV contrast. COMPARISON:  None. FINDINGS: Lower chest: Patchy opacities in the bilateral lower lungs, suspicious for pneumonia. Hepatobiliary: Liver is within normal limits. Layering noncalcified gallstones versus gallbladder sludge (series 2/image 28), without associated inflammatory changes. No intrahepatic or extrahepatic duct dilatation. Pancreas: Within normal limits. Spleen: Within normal limits. Adrenals/Urinary Tract: Adrenal glands are within normal limits. Multiple nonobstructing right lower pole renal calculi measuring up to 7 mm (series 2/image 34). Left kidney is within normal limits. No hydronephrosis. Mildly thick-walled bladder. Stomach/Bowel: Stomach is within normal limits. No evidence of bowel obstruction. Normal appendix (series 2/image 56). Sigmoid diverticulosis, without evidence of diverticulitis. Vascular/Lymphatic: No evidence of abdominal aortic aneurysm. Atherosclerotic calcifications of the abdominal aorta and branch vessels. No suspicious abdominopelvic lymphadenopathy. Reproductive: Prostate is unremarkable. Other: No abdominopelvic ascites. Tiny fat containing periumbilical hernia (series 2/image 48). Mild fat in the bilateral inguinal canals (series 2/image 76).  Musculoskeletal: Degenerative changes of the visualized thoracolumbar spine. IMPRESSION: No evidence of bowel obstruction.  Normal appendix. Sigmoid diverticulosis, without evidence of diverticulitis. Multiple nonobstructing right lower pole renal calculi measuring up to 7 mm. No hydronephrosis. Cholelithiasis, without associated inflammatory changes. Electronically Signed   By: Julian Hy M.D.   On: 10/31/2020 00:18   DG Abd 1 View  Result Date: 11/01/2020 CLINICAL DATA:  NG tube placement EXAM: ABDOMEN - 1 VIEW COMPARISON:  10/31/2020 FINDINGS: NG tube tip is in the stomach. Nonobstructive bowel gas pattern. No visible free air. Left base atelectasis or infiltrate. IMPRESSION: NG tube tip in the stomach. Electronically Signed   By: Rolm Baptise M.D.   On: 11/01/2020 16:45   DG Abd 1 View  Result Date: 10/31/2020 CLINICAL DATA:  Encounter for imaging study to confirm nasogastric (NG) tube placement Z01.89 (ICD-10-CM) EXAM: ABDOMEN - 1 VIEW COMPARISON:  CT 10/30/2020 FINDINGS: Limited radiograph of the lower chest and upper abdomen was obtained for the purposes of enteric tube localization. Enteric tube is seen coursing  below the diaphragm with distal tip and side port terminating within the expected location of the gastric body. Visualized bowel gas pattern is nonobstructive. Patchy bibasilar opacities. IMPRESSION: Enteric tube tip and side port terminating within the expected location of the gastric body. Electronically Signed   By: Davina Poke D.O.   On: 10/31/2020 14:18   CT HEAD WO CONTRAST (5MM)  Result Date: 10/30/2020 CLINICAL DATA:  Mental status change, unknown cause EXAM: CT HEAD WITHOUT CONTRAST TECHNIQUE: Contiguous axial images were obtained from the base of the skull through the vertex without intravenous contrast. COMPARISON:  None. FINDINGS: Brain: Low-density areas in the basal ganglia bilaterally compatible with acute infarcts, age indeterminate, favor chronic. No  hemorrhage or hydrocephalus. Vascular: No hyperdense vessel or unexpected calcification. Skull: No acute calvarial abnormality. Sinuses/Orbits: No acute findings Other: None IMPRESSION: Bilateral basal ganglia lacunar infarcts, age indeterminate but favor chronic. Electronically Signed   By: Rolm Baptise M.D.   On: 10/30/2020 23:58   MR ANGIO HEAD WO CONTRAST  Result Date: 11/01/2020 CLINICAL DATA:  Stroke, follow-up EXAM: MRI HEAD WITHOUT CONTRAST MRA HEAD WITHOUT CONTRAST TECHNIQUE: Multiplanar, multi-echo pulse sequences of the brain and surrounding structures were acquired without intravenous contrast. Angiographic images of the Circle of Willis were acquired using MRA technique without intravenous contrast. COMPARISON:  No prior MRI, correlation is made with CT head 10/30/2020 FINDINGS: MRI HEAD FINDINGS Brain: Symmetric focal areas of hyperintense signal on diffusion-weighted imaging in the bilateral globi palladi (series 5, image 26), without ADC correlate (series 6, image 26), and associated with increased T2 signal (series 15, image 29), likely subacute infarcts. No acute hemorrhage or hemosiderin deposition to suggest remote hemorrhage. No hydrocephalus, mass, mass effect, or midline shift. Vascular: See MRI findings below. Skull and upper cervical spine: Normal marrow signal. Sinuses/Orbits: Mucosal thickening in the ethmoid air cells. Otherwise negative. Other: Fluid in the bilateral mastoid air cells. MRA HEAD FINDINGS Anterior circulation: Both internal carotid arteries are patent to the termini, with irregularity in the left-greater-than-right cavernous portions, likely related to atherosclerotic disease, but without focal stenosis or occlusion. A1 segments patent. Normal anterior communicating artery. Anterior cerebral arteries are patent to their distal aspects. No M1 stenosis or occlusion. Normal MCA bifurcations. Evaluation of the distal MCA branches is somewhat limited by the degree of  motion artifact on the slices, however no focal occlusion is seen. Posterior circulation: Vertebral arteries patent to the vertebrobasilar junction without stenosis. Basilar patent to its distal aspect. Superior cerebral arteries patent bilaterally. PCAs perfused to their distal aspects without focal stenosis. Diminutive left P1 with a patent left posterior communicating artery. Anatomic variants: None significant. IMPRESSION: 1. Focal, symmetric increased signal on diffusion-weighted imaging in the bilateral globi palladi, without ADC correlate, and associated with increased T2 signal. While these signal characteristics can be seen in the setting of subacute ischemia, the symmetry of these finding would be unusual for ischemia but can be seen in the setting of carbon monoxide poisoning, with other etiologies including mitochondrial or toxic encephalopathies. 2. Evaluation of the intracranial vasculature somewhat limited by motion artifact. Within this limitation, no large vessel occlusion or significant focal stenosis. These results were called by telephone at the time of interpretation on 11/01/2020 at 10:23 pm to provider Rufina Falco, who verbally acknowledged these results. Electronically Signed   By: Merilyn Baba M.D.   On: 11/01/2020 22:31   MR BRAIN WO CONTRAST  Result Date: 11/01/2020 CLINICAL DATA:  Stroke, follow-up EXAM: MRI HEAD WITHOUT CONTRAST MRA  HEAD WITHOUT CONTRAST TECHNIQUE: Multiplanar, multi-echo pulse sequences of the brain and surrounding structures were acquired without intravenous contrast. Angiographic images of the Circle of Willis were acquired using MRA technique without intravenous contrast. COMPARISON:  No prior MRI, correlation is made with CT head 10/30/2020 FINDINGS: MRI HEAD FINDINGS Brain: Symmetric focal areas of hyperintense signal on diffusion-weighted imaging in the bilateral globi palladi (series 5, image 26), without ADC correlate (series 6, image 26), and  associated with increased T2 signal (series 15, image 29), likely subacute infarcts. No acute hemorrhage or hemosiderin deposition to suggest remote hemorrhage. No hydrocephalus, mass, mass effect, or midline shift. Vascular: See MRI findings below. Skull and upper cervical spine: Normal marrow signal. Sinuses/Orbits: Mucosal thickening in the ethmoid air cells. Otherwise negative. Other: Fluid in the bilateral mastoid air cells. MRA HEAD FINDINGS Anterior circulation: Both internal carotid arteries are patent to the termini, with irregularity in the left-greater-than-right cavernous portions, likely related to atherosclerotic disease, but without focal stenosis or occlusion. A1 segments patent. Normal anterior communicating artery. Anterior cerebral arteries are patent to their distal aspects. No M1 stenosis or occlusion. Normal MCA bifurcations. Evaluation of the distal MCA branches is somewhat limited by the degree of motion artifact on the slices, however no focal occlusion is seen. Posterior circulation: Vertebral arteries patent to the vertebrobasilar junction without stenosis. Basilar patent to its distal aspect. Superior cerebral arteries patent bilaterally. PCAs perfused to their distal aspects without focal stenosis. Diminutive left P1 with a patent left posterior communicating artery. Anatomic variants: None significant. IMPRESSION: 1. Focal, symmetric increased signal on diffusion-weighted imaging in the bilateral globi palladi, without ADC correlate, and associated with increased T2 signal. While these signal characteristics can be seen in the setting of subacute ischemia, the symmetry of these finding would be unusual for ischemia but can be seen in the setting of carbon monoxide poisoning, with other etiologies including mitochondrial or toxic encephalopathies. 2. Evaluation of the intracranial vasculature somewhat limited by motion artifact. Within this limitation, no large vessel occlusion or  significant focal stenosis. These results were called by telephone at the time of interpretation on 11/01/2020 at 10:23 pm to provider Rufina Falco, who verbally acknowledged these results. Electronically Signed   By: Merilyn Baba M.D.   On: 11/01/2020 22:31   DG Chest Port 1 View  Result Date: 11/02/2020 CLINICAL DATA:  60 year old male with acute respiratory failure. EXAM: PORTABLE CHEST 1 VIEW COMPARISON:  Portable chest 10/30/2020. FINDINGS: Portable AP view at 1028 hours. Stable tracheostomy. Enteric tube now in place, courses to the stomach with tip not included. Mildly improved lung volumes. Regressed patchy and peripheral left lung opacity, only mild residual. Elsewhere lung markings are stable. No pneumothorax, pleural effusion or new pulmonary opacity. No acute osseous abnormality identified. Visible bowel-gas pattern within normal limits. IMPRESSION: 1. Stable tracheostomy. Enteric tube courses to the stomach with tip not included. 2. Substantially regressed left lung opacity with mild residual. 3. No new cardiopulmonary abnormality. Electronically Signed   By: Genevie Ann M.D.   On: 11/02/2020 11:39   DG Chest Portable 1 View  Result Date: 10/30/2020 CLINICAL DATA:  Hypoxia EXAM: PORTABLE CHEST 1 VIEW COMPARISON:  None. FINDINGS: Tracheostomy tube tip about 5.8 cm superior to carina. The right lung shows no focal airspace disease. The left lung shows mild diffuse ground-glass opacity. No pleural effusion. Normal cardiac size. No pneumothorax. IMPRESSION: Diffuse hazy opacity in the left thorax which may be secondary to pneumonia Electronically Signed   By: Maudie Mercury  Francoise Ceo M.D.   On: 10/30/2020 22:44   EEG adult  Result Date: 10/31/2020 Lora Havens, MD     10/31/2020  4:12 PM Patient Name: CHINO SARDO MRN: [id number] Epilepsy Attending: Lora Havens Referring Physician/Provider: Rust-Chester, Huel Cote, NP Date: 10/31/2020 Duration: 23.50 mins Patient history: 60 year old male with  altered mental status.  EEG to evaluate for seizure. Level of alertness:  lethargic AEDs during EEG study: None Technical aspects: This EEG study was done with scalp electrodes positioned according to the 10-20 International system of electrode placement. Electrical activity was acquired at a sampling rate of 500Hz  and reviewed with a high frequency filter of 70Hz  and a low frequency filter of 1Hz . EEG data were recorded continuously and digitally stored. Description: No posterior dominant rhythm was seen. EEG showed continuous generalized 3 to 6 Hz theta-delta slowing. Hyperventilation and photic stimulation were not performed.   ABNORMALITY - Continuous slow, generalized IMPRESSION: This study is suggestive of moderate diffuse encephalopathy, nonspecific etiology. No seizures or epileptiform discharges were seen throughout the recording. Lora Havens   ECHOCARDIOGRAM COMPLETE BUBBLE STUDY  Result Date: 10/31/2020    ECHOCARDIOGRAM REPORT   Patient Name:   JASHON ISHIDA Date of Exam: 10/31/2020 Medical Rec #:  [id number]      Height:       63.0 in Accession #:    [id number]     Weight:       160.5 lb Date of Birth:  [birth year]      BSA:          1.761 m Patient Age:    88 years       BP:           106/70 mmHg Patient Gender: M              HR:           89 bpm. Exam Location:  [facility] Procedure: 2D Echo, Cardiac Doppler, Color Doppler and Saline Contrast Bubble            Study Indications:     Stroke 434.91 / I63.9  History:         Patient has no prior history of Echocardiogram examinations.                  Risk Factors:Hypertension.  Sonographer:     Sherrie Sport Referring Phys:  [id number] BRITTON L RUST-CHESTER Diagnosing Phys: Harrell Gave End MD  Sonographer Comments: Echo performed with patient supine and on artificial respirator and no parasternal window. IMPRESSIONS  1. Left ventricular ejection fraction, by estimation, is >55%. The left ventricle has normal function. Left ventricular endocardial border  not optimally defined to evaluate regional wall motion. Left ventricular diastolic parameters are consistent with Grade I diastolic dysfunction (impaired relaxation). Elevated left atrial pressure.  2. Right ventricular systolic function is moderately reduced. The right ventricular size is normal. Moderately increased right ventricular wall thickness. Tricuspid regurgitation signal is inadequate for assessing PA pressure.  3. A small pericardial effusion is present.  4. The mitral valve is abnormal. No evidence of mitral valve regurgitation. No evidence of mitral stenosis. Moderate to severe mitral annular calcification.  5. The aortic valve has an indeterminant number of cusps. There is moderate calcification of the aortic valve. There is moderate thickening of the aortic valve. Aortic valve regurgitation is not visualized. There is at least mild aortic stenosis, though  evaluation is limited by suboptimal windows.  6. Agitated saline contrast bubble study  was negative, with no evidence of any interatrial shunt. FINDINGS  Left Ventricle: Left ventricular ejection fraction, by estimation, is >55%. The left ventricle has normal function. Left ventricular endocardial border not optimally defined to evaluate regional wall motion. The left ventricular internal cavity size was  normal in size. There is no left ventricular hypertrophy. Left ventricular diastolic parameters are consistent with Grade I diastolic dysfunction (impaired relaxation). Elevated left atrial pressure. Right Ventricle: The right ventricular size is normal. Moderately increased right ventricular wall thickness. Right ventricular systolic function is moderately reduced. Tricuspid regurgitation signal is inadequate for assessing PA pressure. Left Atrium: Left atrial size was normal in size. Right Atrium: Right atrial size was normal in size. Pericardium: A small pericardial effusion is present. Mitral Valve: The mitral valve is abnormal. Moderate to  severe mitral annular calcification. No evidence of mitral valve regurgitation. No evidence of mitral valve stenosis. MV peak gradient, 3.9 mmHg. The mean mitral valve gradient is 2.0 mmHg. Tricuspid Valve: The tricuspid valve is not well visualized. Tricuspid valve regurgitation is trivial. Aortic Valve: The aortic valve has an indeterminant number of cusps. There is moderate calcification of the aortic valve. There is moderate thickening of the aortic valve. Aortic valve regurgitation is not visualized. There is at least mild aortic stenosis, though evaluation is limited by suboptimal windows. Aortic valve mean gradient measures 10.0 mmHg. Aortic valve peak gradient measures 12.4 mmHg. Aortic valve area, by VTI measures 1.59 cm. Pulmonic Valve: The pulmonic valve was not well visualized. Pulmonic valve regurgitation is not visualized. No evidence of pulmonic stenosis. Aorta: The aortic root was not well visualized. Pulmonary Artery: The pulmonary artery is of normal size. Venous: IVC assessment for right atrial pressure unable to be performed due to mechanical ventilation. IAS/Shunts: The interatrial septum was not well visualized. Agitated saline contrast was given intravenously to evaluate for intracardiac shunting. Agitated saline contrast bubble study was negative, with no evidence of any interatrial shunt.  LEFT VENTRICLE PLAX 2D LVIDd:         3.40 cm   Diastology LVIDs:         2.00 cm   LV e' medial:    3.59 cm/s LV PW:         0.90 cm   LV E/e' medial:  20.1 LV IVS:        0.90 cm   LV e' lateral:   6.20 cm/s LVOT diam:     2.00 cm   LV E/e' lateral: 11.6 LV SV:         41 LV SV Index:   23 LVOT Area:     3.14 cm  RIGHT VENTRICLE RV S prime:     14.10 cm/s TAPSE (M-mode): 2.8 cm LEFT ATRIUM           Index        RIGHT ATRIUM          Index LA diam:      3.10 cm 1.76 cm/m   RA Area:     6.72 cm LA Vol (A4C): 19.9 ml 11.30 ml/m  RA Volume:   12.20 ml 6.93 ml/m  AORTIC VALVE                      PULMONIC VALVE AV Area (Vmax):    0.99 cm      PV Vmax:        0.83 m/s AV Area (Vmean):   1.11 cm  PV Peak grad:   2.7 mmHg AV Area (VTI):     1.59 cm      RVOT Peak grad: 2 mmHg AV Vmax:           176.00 cm/s AV Vmean:          116.500 cm/s AV VTI:            0.257 m AV Peak Grad:      12.4 mmHg AV Mean Grad:      10.0 mmHg LVOT Vmax:         55.50 cm/s LVOT Vmean:        41.000 cm/s LVOT VTI:          0.130 m LVOT/AV VTI ratio: 0.51 MITRAL VALVE MV Area (PHT): 2.57 cm    SHUNTS MV Area VTI:   1.63 cm    Systemic VTI:  0.13 m MV Peak grad:  3.9 mmHg    Systemic Diam: 2.00 cm MV Mean grad:  2.0 mmHg MV Vmax:       0.99 m/s MV Vmean:      57.2 cm/s MV Decel Time: 295 msec MV E velocity: 72.00 cm/s MV A velocity: 84.00 cm/s MV E/A ratio:  0.86 Christopher End MD Electronically signed by [name] MD Signature Date/Time: 10/31/2020/5:39:22 PM    Final     Labs:  CBC: Recent Labs    11/10/20 0530 11/11/20 0512 11/12/20 0518 11/13/20 0433  WBC 9.1 10.8* 7.9 6.3  HGB 11.8* 12.6* 11.1* 11.2*  HCT 35.0* 36.7* 33.0* 33.3*  PLT 198 243 223 223    COAGS: Recent Labs    10/30/20 2211 11/13/20 0854  INR 1.9* 1.1  APTT 40*  --     BMP: Recent Labs    11/10/20 0530 11/11/20 0512 11/12/20 0518 11/13/20 0433  NA 141 141 140 138  K 3.8 3.4* 3.6 3.7  CL 112* 110 109 108  CO2 24 23 24 24   GLUCOSE 86 95 90 88  BUN 29* 25* 22 19  CALCIUM 8.2* 8.5* 8.2* 8.2*  CREATININE 0.92 0.93 0.86 0.87  GFRNONAA >60 >60 >60 >60    LIVER FUNCTION TESTS: Recent Labs    11/10/20 0530 11/11/20 0512 11/12/20 0518 11/13/20 0433  BILITOT 0.8 1.1 0.7 0.8  AST 34 36 32 28  ALT 35 33 30 26  ALKPHOS 54 63 53 51  PROT 6.1* 6.6 6.0* 5.9*  ALBUMIN 2.6* 2.7* 2.4* 2.4*    Assessment and Plan: 60 year old male with PMHx significant for Laryngeal cancer s/p tracheostomy, radiation and chemotherapy. Patient presented to ED 10/17 after son had concern given altered mental status. Patient found to  have septic shock with multiorgan failure involving liver, kidneys, cardiac with hypoxic respiratory failure and NSTEMI. The patient was treated with IV antibiotics and IV Heparin with improvement. He has significant dysphagia and aspiration risk and was evaluated by speech pathology with recommendation for dysphagia 1 diet. IR has been consulted for gastrostomy tube placement.   The patient has been NPO, CT imaging was reviewed, no blood thinners taken- heparin has been held today, labs and vitals have been reviewed.  Risks and benefits image guided gastrostomy tube placement was discussed with the patient including, but not limited to bleeding, infection, peritonitis and/or damage to adjacent structures.  All of the patient's questions were answered, patient is agreeable to proceed.  Consent signed and in chart.   Thank you for this interesting consult.  I greatly enjoyed meeting  Drema Dallas and look forward to participating in their care.  A copy of this report was sent to the requesting provider on this date.  Electronically Signed: Hedy Jacob, PA-C 11/13/2020, 10:56 AM   I spent a total of 20 Minutes in face to face in clinical consultation, greater than 50% of which was counseling/coordinating care for dysphagia with aspiration risk.
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
## 2020-11-13 NOTE — Progress Notes (Signed)
Patient clinically stable post 18 FR Gtube placement per Dr Dwaine Gale, tolerated well. Awake/alert and oriented post procedure. Received Versed 0.5 mg along with Fentanyl 25 mcg IV , post procedure, report given at bedside to [facility] on 2A with questions answered.

## 2020-11-13 NOTE — Progress Notes (Signed)
SLP Cancellation Note  Patient Details Name: Ronald Murphy MRN: [id number] DOB: [birth year]   Cancelled treatment:       Reason Eval/Treat Not Completed: Other (comment) Per chart review, pt OTF for PEG placement. Will continue efforts, as appropriate.   Clearnce Sorrel [name] 11/13/2020, 12:03 PM
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
## 2020-11-13 NOTE — Procedures (Signed)
Interventional Radiology Procedure Note  Procedure: Percutaneous Gastrostomy Tube Placement  Indication: Dysphagia  Findings: Please refer to procedural dictation for full description.  Complications: None  EBL: < 10 mL  Miachel Roux, MD [phone]

## 2020-11-14 LAB — CBC WITH DIFFERENTIAL/PLATELET
Abs Immature Granulocytes: 0.03 10*3/uL (ref 0.00–0.07)
Basophils Absolute: 0 10*3/uL (ref 0.0–0.1)
Basophils Relative: 0 %
Eosinophils Absolute: 0.1 10*3/uL (ref 0.0–0.5)
Eosinophils Relative: 2 %
HCT: 34.1 % — ABNORMAL LOW (ref 39.0–52.0)
Hemoglobin: 11.4 g/dL — ABNORMAL LOW (ref 13.0–17.0)
Immature Granulocytes: 1 %
Lymphocytes Relative: 12 %
Lymphs Abs: 0.7 10*3/uL (ref 0.7–4.0)
MCH: 30.6 pg (ref 26.0–34.0)
MCHC: 33.4 g/dL (ref 30.0–36.0)
MCV: 91.4 fL (ref 80.0–100.0)
Monocytes Absolute: 0.3 10*3/uL (ref 0.1–1.0)
Monocytes Relative: 6 %
Neutro Abs: 4.4 10*3/uL (ref 1.7–7.7)
Neutrophils Relative %: 79 %
Platelets: 231 10*3/uL (ref 150–400)
RBC: 3.73 MIL/uL — ABNORMAL LOW (ref 4.22–5.81)
RDW: 15.6 % — ABNORMAL HIGH (ref 11.5–15.5)
WBC: 5.5 10*3/uL (ref 4.0–10.5)
nRBC: 0 % (ref 0.0–0.2)

## 2020-11-14 LAB — PHOSPHORUS: Phosphorus: 3.4 mg/dL (ref 2.5–4.6)

## 2020-11-14 LAB — MAGNESIUM: Magnesium: 1.8 mg/dL (ref 1.7–2.4)

## 2020-11-14 MED ORDER — FREE WATER
100.0000 mL | Freq: Three times a day (TID) | Status: DC
  Administered 2020-11-14 – 2020-11-15 (×4): 100 mL

## 2020-11-14 MED ORDER — ENSURE ENLIVE PO LIQD
237.0000 mL | Freq: Three times a day (TID) | ORAL | Status: DC
  Administered 2020-11-14 – 2020-11-15 (×4): 237 mL

## 2020-11-14 NOTE — Plan of Care (Signed)

## 2020-11-14 NOTE — Progress Notes (Signed)
11/1 PROGRESS NOTE    Ronald Murphy  YOV:[id number] DOB: 1960/03/02 DOA: 10/30/2020 PCP: Pcp, No   Brief Narrative:  60 y.o. 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  Assessment & Plan:  Covid vaccination;  Active Problems:   Chronic hepatitis C without hepatic coma (HCC)   Tracheostomy status (HCC)   Acute metabolic encephalopathy   History of laryngeal cancer   Acute renal failure (HCC)   NSTEMI (non-ST elevated myocardial infarction) (HCC)   Small cell carcinoma of lung (HCC)   Radiation-induced esophageal stricture   Chronic, continuous use of opioids   Chronic hyponatremia   Acute hepatitis   Acute respiratory failure (HCC)   Altered mental status   Pressure injury of skin   Protein-calorie malnutrition, severe   Pneumonia due to methicillin susceptible Staphylococcus aureus (MSSA) ([place])   Hypernatremia   #NSTEMI --[place]armon cards following with plans for conservative medical management.  --Completed 48hrs of heparin gtt. --ASA/statin started.  --Cardiology will consider DAPT prior to discharge--messaged Dr K--ok to start po plavix   Acute on chronic hypoxic and hypercapnic respiratory failure -Trach dependent - Currently Shiley 6 mm cuffed 10/31/2020>>> -- patient currently on trach collar --10/24-- plan to change to passi muir valve --10/25--pt is doing well. Able to talk. 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 In context of acute illness/decompensation, recommend continuing dysphagia 1 (puree) and nectar thick liquids with strict precautions/compensations, including: upright for all PO intake; chin tuck with all swallows. Hard cough after liquids to clear penetration. Double swallow for solids, follow with sip of liquid. Slow rate, small bites and sips -ADDENDUM 10/29 patient no longer wants to discharge on home hospice.  Would like to be considered for PEG tube placement and then discharged on home palliative care. -10/31 s/p PEG tube placement -11/1 start bolus feeds Ensure TID -11/1 Free water 113m TID -11/1 continue aspiration precautions -11/1 patient and wife counseled that he must be able to tolerate full bolus feeds at a minimum prior to discharge hopefully in the A.m.   #Septic Shock 2/2 positive MSSA PNA (resolved) --now weaned entirely from mechanical ventilation --on IV Day cefazolin. Previously completed 2 days of ceftriaxone prior to culture data finalization --Discontinue solumedrol --Continue bronchodilators for now       #Acute metabolic encephalopathy -Multifactorial: Most likely secondary to acute respiratory failure, septic shock, MSSA pneumonia -Underlying causes treated.  Has resolved  #Bilateral basal ganglia lacunar infarct-new since May 2022 --Stat telemetry neuro consult obtained to look over imaging and receive recommendations as patient is receiving high-dose heparin for N-STEMI. Per telemetry neurologist infarcts appear chronic on imaging -- continue aspirin   #Acute kidney injury  2/2  Rhabdomyolysis Lab Results  Component Value Date   CREATININE 0.87 11/13/2020   CREATININE 0.86 11/12/2020   CREATININE 0.93 11/11/2020   CREATININE 0.92 11/10/2020   CREATININE 1.08 11/09/2020  -Resolved  Hypernatremia Lab Results  Component Value Date   NA 138 11/13/2020   NA 140 11/12/2020   NA 141 11/11/2020   NA 141 11/10/2020   NA 143 11/09/2020  -Resolved   #Shock liver/Chronic hepatitis C --LFTs downtrending.  --Has underlying active HCV by chart review (+ viral load in [facility] system in 2020).  -LFTs resolved   #Pressure injuries of hip/knee/feet/Burn on left ear --supportive care, wound consult, Turn q2hrs  Hypokalemia - Potassium goal> - 10/27 potassium IV 60 mEq   Hypomagnesmia - Magnesium goal> 2 - 11/1 magnesium IV 4 g  Obese (BMI 28.51kg/m)  Dysphagia - 12/30 spoke with NP Manuela Schwartz, and she spoke with Dr.El-Abd IR and they agreed to place PEG tube in the A.m. -Patient in consult that PEG tube does not fully eliminate his risk of -10/31 successful placement of PEG tube    DVT prophylaxis: Heparin Code Status: Full Family Communication: 10/27 wife at bedside for discussion of plan of care all questions answered Status is: Inpatient    Dispo: The patient is from: SNF              Anticipated d/c is to: SNF              Anticipated d/c date is: > 3 days              Patient currently is not medically stable to d/c.      Consultants:  Palliative care FNP Jordan Hawks   Procedures/Significant Events:  10/27 MBS performed: Patient failed, mild to moderate aspiration risk 10/31 s/p PEG tube placement   I have personally reviewed and interpreted all radiology studies and my findings are as above.  VENTILATOR SETTINGS: Trach blow-by 11/1 Flow 5 L/min FiO2 28% SPO2 95%   Cultures   Antimicrobials: Anti-infectives (From admission, onward)    Start     Ordered Stop   11/06/20 1400  ceFAZolin (ANCEF) IVPB 2g/100 mL premix         11/06/20 0742 11/08/20 1244   11/05/20 1400  ceFAZolin (ANCEF) IVPB 1 g/50 mL premix  Status:  Discontinued        11/05/20 0735 11/06/20 0742   11/03/20 1100  ceFAZolin (ANCEF) IVPB 1 g/50 mL premix  Status:  Discontinued        11/03/20 1006 11/05/20 0735   10/31/20 2200  cefTRIAXone (ROCEPHIN) 1 g in sodium chloride 0.9 % 100 mL IVPB  Status:  Discontinued        10/31/20 0258 11/03/20 1006   10/31/20 2200  azithromycin (ZITHROMAX) 500 mg in sodium chloride 0.9 % 250 mL IVPB        10/31/20 0258 11/03/20 2349   10/31/20 0030  cefTRIAXone (ROCEPHIN) 1 g in sodium chloride 0.9 % 100 mL IVPB        10/31/20 0029 10/31/20 0248   10/31/20 0030  azithromycin (ZITHROMAX) 500 mg in sodium chloride 0.9 % 250 mL IVPB        10/31/20 0029 10/31/20 0248         Devices    LINES / TUBES:  Shiley 6 mm cuffed 10/31/2020>>>    Continuous Infusions:  sodium chloride 250 mL (11/09/20 1740)    ceFAZolin (ANCEF) IV      ceFAZolin (ANCEF)  IV       Objective: Vitals:   11/13/20 2322 11/14/20 0150 11/14/20 0402 11/14/20 0745  BP:  122/88 98/71 130/83  Pulse:  72 67 85  Resp:   18   Temp:   97.8 F (36.6 C) 97.6 F (36.4 C)  TempSrc:      SpO2: 97%  97% 95%  Weight:   70.3 kg   Height:        Intake/Output Summary (Last 24 hours) at 11/14/2020 0751 Last data filed at 11/14/2020 0300 Gross per 24 hour  Intake 262.64 ml  Output --  Net 262.64 ml    Filed Weights   11/12/20 0500 11/13/20 0435 11/14/20 0402  Weight: 71.5 kg 71.9 kg 70.3 kg    Examination:  General: Alert, nods yes and no to questions although capable of speaking PMV in place.  Positive acute on chronic respiratory distress (chronic trach), cachectic Eyes: negative scleral hemorrhage, negative anisocoria, negative icterus ENT: Negative Runny nose, negative gingival bleeding, Neck:  Negative scars, masses, torticollis, lymphadenopathy, JVD, #6 cuffed Shiley in place, whitish discharge Lungs: tachypneic coarse  breath sounds bilaterally without wheezes or crackles Cardiovascular: Regular rate and rhythm without murmur gallop or rub normal S1 and S2 Abdomen: Positive abdominal pain, positive minor distention , positive soft, bowel sounds, no rebound, no ascites, no appreciable mass Extremities: No significant cyanosis, clubbing, or edema bilateral lower extremities Skin: Negative rashes, lesions, ulcers Psychiatric: Unable to evaluate secondary to chronic trach Central nervous system:  Cranial nerves II through XII intact, tongue/uvula midline, all extremities muscle strength 5/5, sensation intact throughout, unable to evaluate secondary to chronic trach .     Data Reviewed: Care during the described time interval was provided by me .  I have reviewed this patient's available data, including medical history, events of note, physical examination, and all test results as part of my evaluation.   CBC: Recent Labs  Lab 11/10/20 0530 11/11/20 0512 11/12/20 0518 11/13/20 0433 11/14/20 0536  WBC 9.1 10.8* 7.9 6.3 5.5  NEUTROABS 7.8* 9.5* 6.7 5.2 4.4  HGB 11.8* 12.6* 11.1* 11.2* 11.4*  HCT 35.0* 36.7* 33.0* 33.3* 34.1*  MCV 87.7 89.5 87.3 88.6 91.4  PLT 198 243 223 223 174    Basic Metabolic Panel: Recent Labs  Lab 11/09/20 0444 11/10/20 0530 11/11/20 0512 11/12/20 0518 11/13/20 0433 11/14/20 0536  NA 143 141 141 140 138  --   K 3.5 3.8 3.4* 3.6 3.7  --   CL 110 112* 110 109 108  --   CO2 24 24 23 24 24   --   GLUCOSE 92 86 95 90 88  --   BUN 34* 29* 25* 22 19  --   CREATININE 1.08 0.92 0.93 0.86 0.87  --   CALCIUM 8.4* 8.2* 8.5* 8.2* 8.2*  --   MG 1.6* 2.1 1.8 1.6* 1.5* 1.8  PHOS 2.7 2.7 3.2 3.1 3.1 3.4    GFR: Estimated Creatinine Clearance: 78.6 mL/min (by C-G formula based on SCr of 0.87 mg/dL). Liver Function Tests: Recent Labs  Lab 11/09/20 0444 11/10/20 0530 11/11/20 0512 11/12/20 0518 11/13/20 0433  AST 36 34 36 32 28  ALT 40 35 33 30 26  ALKPHOS 55 54 63 53 51   BILITOT 0.8 0.8 1.1 0.7 0.8  PROT 5.9* 6.1* 6.6 6.0* 5.9*  ALBUMIN 2.3* 2.6* 2.7* 2.4* 2.4*    No results for input(s): LIPASE, AMYLASE in the last 168 hours. No results for input(s): AMMONIA in the  last 168 hours. Coagulation Profile: Recent Labs  Lab 11/13/20 0854  INR 1.1    Cardiac Enzymes: No results for input(s): CKTOTAL, CKMB, CKMBINDEX, TROPONINI in the last 168 hours.  BNP (last 3 results) No results for input(s): PROBNP in the last 8760 hours. HbA1C: No results for input(s): HGBA1C in the last 72 hours. CBG: No results for input(s): GLUCAP in the last 168 hours.  Lipid Profile: No results for input(s): CHOL, HDL, LDLCALC, TRIG, CHOLHDL, LDLDIRECT in the last 72 hours. Thyroid Function Tests: No results for input(s): TSH, T4TOTAL, FREET4, T3FREE, THYROIDAB in the last 72 hours. Anemia Panel: No results for input(s): VITAMINB12, FOLATE, FERRITIN, TIBC, IRON, RETICCTPCT in the last 72 hours. Urine analysis:    Component Value Date/Time   COLORURINE AMBER (A) 10/30/2020 0006   APPEARANCEUR CLOUDY (A) 10/30/2020 0006   LABSPEC 1.014 10/30/2020 0006   PHURINE 5.0 10/30/2020 0006   GLUCOSEU 50 (A) 10/30/2020 0006   HGBUR LARGE (A) 10/30/2020 0006   BILIRUBINUR NEGATIVE 10/30/2020 0006   KETONESUR 5 (A) 10/30/2020 0006   PROTEINUR 100 (A) 10/30/2020 0006   NITRITE NEGATIVE 10/30/2020 0006   LEUKOCYTESUR NEGATIVE 10/30/2020 0006   Sepsis Labs: @LABRCNTIP (procalcitonin:4,lacticidven:4)  ) No results found for this or any previous visit (from the past 240 hour(s)).        Radiology Studies: IR GASTROSTOMY TUBE MOD SED  Result Date: 11/13/2020 INDICATION: 60 year old gentleman with history of laryngeal malignancy and dysphagia presents to IR for percutaneous gastrostomy tube placement. EXAM: Ultrasound and fluoroscopy guided percutaneous gastrostomy tube placement MEDICATIONS: Ancef 2 g IV; Antibiotics were administered within 1 hour of the procedure.  Glucagon 1 mg IV ANESTHESIA/SEDATION: Moderate (conscious) sedation was employed during this procedure. A total of Versed 0.5 mg and Fentanyl 25 mcg was administered intravenously by the radiology nurse. Total intra-service moderate Sedation Time: 27 minutes. The patient's level of consciousness and vital signs were monitored continuously by radiology nursing throughout the procedure under my direct supervision. CONTRAST:  8 mL Omnipaque 350-administered into the gastric lumen. FLUOROSCOPY TIME:  Fluoroscopy Time: 2 minutes 0 seconds (15.5 mGy). COMPLICATIONS: None immediate. PROCEDURE: Informed written consent was obtained from the patient after a thorough discussion of the procedural risks, benefits and alternatives. All questions were addressed. Maximal Sterile Barrier Technique was utilized including caps, mask, sterile gowns, sterile gloves, sterile drape, hand hygiene and skin antiseptic. A timeout was performed prior to the initiation of the procedure. The left hepatic lobe margin was marked utilizing ultrasound guidance. The epigastric region was prepped and draped in the usual sterile fashion. The stomach was insufflated utilizing the OG tube. Following local lidocaine administration, three gastropexies were placed to secure the anterior wall of the stomach to the anterior abdominal wall. Percutaneous access obtained into the gastric body at the center of the gastropexies with an 18 gauge needle. Guide wire advanced into the gastric lumen. Gastrostomy tube placed on the guidewire. The G tube retention balloon was inflated with 10 mL of dilute contrast and retracted to the anterior gastric wall. Contrast administrated through the gastrostomy tube opacified the gastric lumen. The insertion site was covered with sterile dressing. IMPRESSION: Placement of 22 French percutaneous gastrostomy tube as above. Electronically Signed   By: Miachel Roux M.D.   On: 11/13/2020 12:10        Scheduled Meds:  aspirin   81 mg Per Tube Daily   chlorhexidine gluconate (MEDLINE KIT)  15 mL Mouth Rinse BID   Chlorhexidine Gluconate Cloth  6  each Topical Daily   escitalopram  20 mg Per Tube Daily   heparin injection (subcutaneous)  5,000 Units Subcutaneous Q8H   hydrALAZINE  50 mg Per Tube Q8H   levothyroxine  50 mcg Per Tube Daily   losartan  25 mg Per Tube Daily   metoprolol tartrate  25 mg Per Tube BID   multivitamin with minerals  1 tablet Per Tube Daily   pantoprazole (PROTONIX) IV  40 mg Intravenous QHS   white petrolatum   Topical BID   Continuous Infusions:  sodium chloride 250 mL (11/09/20 1740)    ceFAZolin (ANCEF) IV      ceFAZolin (ANCEF) IV       LOS: 14 days   The patient is critically ill with multiple organ systems failure and requires high complexity decision making for assessment and support, frequent evaluation and titration of therapies, application of advanced monitoring technologies and extensive interpretation of multiple databases. Critical Care Time devoted to patient care services described in this note  Time spent: 40 minutes     Yue Flanigan, Geraldo Docker, MD [facility]   If 7PM-7AM, please contact night-coverage 11/14/2020, 7:51 AM

## 2020-11-14 NOTE — Progress Notes (Signed)
Occupational Therapy Treatment Patient Details Name: Ronald Murphy MRN: [id number] DOB: 05/29/1960 Today's Date: 11/14/2020   History of present illness Pt is a 60 y/o M who presented to the ED from home on 10/30/20 due to his son's concern of AMS. Pt was admitted to the ICU on 10/18 & treated for severe transaminitis, possible sepsis, NTSEMI, AKI with severe hyperkalemia, and acute AMS. Pt found to have Tracheal aspirate with Staph aureus. PMH: laryngeal CA s/p tracheostomy, Hep C, alcoholism in remission, anxiety, depression, HTN   OT comments  Chart reviewed, RN cleared pt for participation in OT tx session. Pt greeted in bed, agreeable to tx session. Tx session targeted progressing strength/endurance for increased independence/safety during ADL completion. Pt with increased tolerance for standing grooming tasks on this date. Frequency updated to reflect current level of functioning based on discharge recommendations. Pt is let as received, NAD, all needs met. Pt requests to use bed pan, therapist educates on use of BSC, pt declines. RN aware of pt use of bed pan at completion of session. OT will continue to follow.    Recommendations for follow up therapy are one component of a multi-disciplinary discharge planning process, led by the attending physician.  Recommendations may be updated based on patient status, additional functional criteria and insurance authorization.    Follow Up Recommendations  Home health OT    Assistance Recommended at Discharge Frequent or constant Supervision/Assistance  Equipment Recommendations  [facility]       Precautions / Restrictions Precautions Precautions: Fall Precaution Comments: chronic trach Restrictions Weight Bearing Restrictions: No       Mobility Bed Mobility Overal bed mobility: Needs Assistance Bed Mobility: Supine to Sit;Sit to Supine     Supine to sit: Min assist;HOB elevated Sit to supine: Min assist        Transfers Overall  transfer level: Needs assistance Equipment used: Rolling walker (2 wheels) Transfers: Sit to/from Stand Sit to Stand: Min guard                 Balance Overall balance assessment: Needs assistance Sitting-balance support: Feet supported Sitting balance-Leahy Scale: Good     Standing balance support: During functional activity;Bilateral upper extremity supported Standing balance-Leahy Scale: Fair                             ADL either performed or assessed with clinical judgement   ADL Overall ADL's : Needs assistance/impaired                                       General ADL Comments: Grooming in standing with RW with MIN A for approx 7 minutes, 2 rest breaks provided. UB bathing with MIN A at edge of bed.      Cognition Arousal/Alertness: Awake/alert Behavior During Therapy: WFL for tasks assessed/performed;Flat affect Overall Cognitive Status: Impaired/Different from baseline                         Following Commands: Follows one step commands with increased time Safety/Judgement: Decreased awareness of safety;Decreased awareness of deficits                Exercises Other Exercises Other Exercises: Pt and family educ re: OT role, d/c recs, importance of mvmt for functional mobility   Shoulder Instructions  General Comments VSS throughout    Pertinent Vitals/ Pain       Pain Score: 8  Pain Location: g tube site Pain Descriptors / Indicators: Grimacing Pain Intervention(s): Limited activity within patient's tolerance;Other (comment);Monitored during session (RN notified)   Frequency  Min 2X/week        Progress Toward Goals  OT Goals(current goals can now be found in the care plan section)  Progress towards OT goals: Progressing toward goals  Acute Rehab OT Goals Patient Stated Goal: to go home OT Goal Formulation: With patient  Plan Discharge plan remains appropriate;Frequency remains appropriate        AM-PAC OT "6 Clicks" Daily Activity     Outcome Measure   Help from another person eating meals?: A Little Help from another person taking care of personal grooming?: A Little Help from another person toileting, which includes using toliet, bedpan, or urinal?: A Lot Help from another person bathing (including washing, rinsing, drying)?: A Lot Help from another person to put on and taking off regular upper body clothing?: A Lot Help from another person to put on and taking off regular lower body clothing?: A Lot 6 Click Score: 14    End of Session Equipment Utilized During Treatment: Rolling walker (2 wheels)  OT Visit Diagnosis: Unsteadiness on feet (R26.81);Other abnormalities of gait and mobility (R26.89);Muscle weakness (generalized) (M62.81)   Activity Tolerance Patient tolerated treatment well;Patient limited by fatigue   Patient Left in bed;with call bell/phone within reach;with bed alarm set   Nurse Communication Mobility status        Time: [id number] OT Time Calculation (min): 21 min  Charges: OT General Charges $OT Visit: 1 Visit OT Treatments $Self Care/Home Management : 8-22 mins  ,Shanon Payor, OTD OTR/L  11/14/20, 4:16 PM

## 2020-11-14 NOTE — TOC Progression Note (Signed)
Transition of Care [facility]) - Progression Note    Patient Details  Name: Ronald Murphy MRN: [id number] Date of Birth: June 26, 1960  Transition of Care [facility] CM/SW [place], [place] Phone Number: 11/14/2020, 2:07 PM  Clinical Narrative:     CSW has informed Thedore Mins with [employer] of need for hospital bed, they will follow up with contacting patient's son to discuss copays and bed delivery if they wish to proceed.   Apolonio Schneiders with [facility] still working on identifying a [facility] agency who can accept patient.   [place], [place]        Expected Discharge Plan and Services                                                 Social Determinants of Health (SDOH) Interventions    Readmission Risk Interventions Readmission Risk Prevention Plan 11/03/2020  Transportation Screening Complete  Medication Review Press photographer) Not Complete  Med Review Comments Spoke with Son who had to hang up to take care of personal business.  PCP or Specialist appointment within 3-5 days of discharge Not Complete  PCP/Specialist Appt Not Complete comments NMS for discharge  [place] or [place] Complete  SW Recovery Care/Counseling Consult Complete  Palliative Care Screening Not Applicable  Skilled Nursing Facility Complete  Some recent data might be hidden

## 2020-11-14 NOTE — Progress Notes (Signed)
    Durable Medical Equipment  (From admission, onward)           Start     Ordered   11/14/20 1359  For home use only DME Hospital bed  Once       Question Answer Comment  Length of Need Lifetime   The above medical condition requires: Patient requires the ability to reposition frequently   Head must be elevated greater than: 45 degrees   Bed type Semi-electric   Support Surface: Gel Overlay      11/14/20 [address], [place]

## 2020-11-14 NOTE — Progress Notes (Signed)
Patient's airway, tracheostomy, was suctioned. There was a small amount of white, tan thick secretions removed. Patient had no issues with suctioning.

## 2020-11-14 NOTE — Care Management Important Message (Signed)
Important Message  Patient Details  Name: Ronald Murphy MRN: [id number] Date of Birth: December 03, 1960   Medicare Important Message Given:  Yes     Dannette Barbara 11/14/2020, 4:13 PM

## 2020-11-15 ENCOUNTER — Ambulatory Visit: Admit: 2020-11-15 | Payer: MEDICARE | Attending: Hematology & Oncology | Primary: Hematology & Oncology

## 2020-11-15 ENCOUNTER — Ambulatory Visit: Admit: 2020-11-15 | Payer: MEDICARE

## 2020-11-15 DIAGNOSIS — B182 Chronic viral hepatitis C: Secondary | ICD-10-CM

## 2020-11-15 LAB — CBC WITH DIFFERENTIAL/PLATELET
Abs Immature Granulocytes: 0.03 10*3/uL (ref 0.00–0.07)
Basophils Absolute: 0 10*3/uL (ref 0.0–0.1)
Basophils Relative: 0 %
Eosinophils Absolute: 0.1 10*3/uL (ref 0.0–0.5)
Eosinophils Relative: 3 %
HCT: 31.8 % — ABNORMAL LOW (ref 39.0–52.0)
Hemoglobin: 10.7 g/dL — ABNORMAL LOW (ref 13.0–17.0)
Immature Granulocytes: 1 %
Lymphocytes Relative: 14 %
Lymphs Abs: 0.7 10*3/uL (ref 0.7–4.0)
MCH: 29.4 pg (ref 26.0–34.0)
MCHC: 33.6 g/dL (ref 30.0–36.0)
MCV: 87.4 fL (ref 80.0–100.0)
Monocytes Absolute: 0.4 10*3/uL (ref 0.1–1.0)
Monocytes Relative: 9 %
Neutro Abs: 3.5 10*3/uL (ref 1.7–7.7)
Neutrophils Relative %: 73 %
Platelets: 238 10*3/uL (ref 150–400)
RBC: 3.64 MIL/uL — ABNORMAL LOW (ref 4.22–5.81)
RDW: 15.4 % (ref 11.5–15.5)
WBC: 4.7 10*3/uL (ref 4.0–10.5)
nRBC: 0 % (ref 0.0–0.2)

## 2020-11-15 LAB — COMPREHENSIVE METABOLIC PANEL
ALT: 21 U/L (ref 0–44)
AST: 24 U/L (ref 15–41)
Albumin: 2.3 g/dL — ABNORMAL LOW (ref 3.5–5.0)
Alkaline Phosphatase: 56 U/L (ref 38–126)
Anion gap: 6 (ref 5–15)
BUN: 29 mg/dL — ABNORMAL HIGH (ref 8–23)
CO2: 26 mmol/L (ref 22–32)
Calcium: 8.1 mg/dL — ABNORMAL LOW (ref 8.9–10.3)
Chloride: 103 mmol/L (ref 98–111)
Creatinine, Ser: 0.97 mg/dL (ref 0.61–1.24)
GFR, Estimated: >60 mL/min (ref >60)
Glucose, Bld: 93 mg/dL (ref 70–99)
Potassium: 3.8 mmol/L (ref 3.5–5.1)
Sodium: 135 mmol/L (ref 135–145)
Total Bilirubin: 0.7 mg/dL (ref 0.3–1.2)
Total Protein: 5.9 g/dL — ABNORMAL LOW (ref 6.5–8.1)

## 2020-11-15 LAB — PHOSPHORUS: Phosphorus: 3 mg/dL (ref 2.5–4.6)

## 2020-11-15 LAB — GLUCOSE, CAPILLARY
Glucose-Capillary: 92 mg/dL (ref 70–99)
Glucose-Capillary: 96 mg/dL (ref 70–99)

## 2020-11-15 LAB — MAGNESIUM: Magnesium: 1.6 mg/dL — ABNORMAL LOW (ref 1.7–2.4)

## 2020-11-15 MED ORDER — PANTOPRAZOLE 2 MG/ML SUSPENSION
40.0000 mg | Freq: Every day | ORAL | Status: DC
  Administered 2020-11-15: 40 mg
  Filled 2020-11-15: qty 20

## 2020-11-15 MED ORDER — OSMOLITE 1.2 CAL PO LIQD: 355.0000 mL | Freq: Four times a day (QID) | ORAL | 3 refills | Status: DC

## 2020-11-15 MED ORDER — ADULT MULTIVITAMIN W/MINERALS CH: 1.0000 | ORAL_TABLET | Freq: Every day | ORAL | 0 refills | Status: DC

## 2020-11-15 MED ORDER — METOPROLOL TARTRATE 25 MG PO TABS: 25.0000 mg | ORAL_TABLET | Freq: Two times a day (BID) | ORAL | 3 refills | Status: DC

## 2020-11-15 MED ORDER — MAGNESIUM SULFATE 2 GM/50ML IV SOLN
2.0000 g | Freq: Once | INTRAVENOUS | Status: AC
  Administered 2020-11-15: 2 g via INTRAVENOUS
  Filled 2020-11-15: qty 50

## 2020-11-15 MED ORDER — CLOPIDOGREL BISULFATE 75 MG PO TABS
75.0000 mg | ORAL_TABLET | Freq: Every day | ORAL | Status: DC
  Administered 2020-11-15: 75 mg
  Filled 2020-11-15: qty 1

## 2020-11-15 MED ORDER — OMEPRAZOLE 40 MG PO CPDR: 40.0000 mg | DELAYED_RELEASE_CAPSULE | Freq: Every day | ORAL | 0 refills | Status: DC

## 2020-11-15 MED ORDER — GUAIFENESIN 100 MG/5ML PO LIQD: 5.0000 mL | ORAL | 0 refills | Status: DC | PRN

## 2020-11-15 MED ORDER — OXYCODONE HCL 15 MG PO TABS: 15.0000 mg | ORAL_TABLET | ORAL | 0 refills | Status: DC | PRN

## 2020-11-15 MED ORDER — DOCUSATE SODIUM 50 MG/5ML PO LIQD: 100.0000 mg | Freq: Every day | ORAL | 0 refills | Status: DC | PRN

## 2020-11-15 MED ORDER — ESCITALOPRAM OXALATE 20 MG PO TABS: 20.0000 mg | ORAL_TABLET | Freq: Every day | ORAL | 3 refills | Status: DC

## 2020-11-15 MED ORDER — ASPIRIN 81 MG PO CHEW: 81.0000 mg | CHEWABLE_TABLET | Freq: Every day | ORAL | 3 refills | Status: DC

## 2020-11-15 MED ORDER — POLYETHYLENE GLYCOL 3350 17 G PO PACK: 17.0000 g | PACK | Freq: Every day | ORAL | 0 refills | Status: DC | PRN

## 2020-11-15 MED ORDER — OSMOLITE 1.2 CAL PO LIQD
355.0000 mL | Freq: Four times a day (QID) | ORAL | Status: DC
  Administered 2020-11-15 (×2): 355 mL

## 2020-11-15 MED ORDER — FREE WATER: 160.0000 mL | Freq: Four times a day (QID) | 2 refills | Status: DC

## 2020-11-15 MED ORDER — CLOPIDOGREL BISULFATE 75 MG PO TABS: 75.0000 mg | ORAL_TABLET | Freq: Every day | ORAL | 3 refills | Status: DC

## 2020-11-15 MED ORDER — LOSARTAN POTASSIUM 25 MG PO TABS: 25.0000 mg | ORAL_TABLET | Freq: Every day | ORAL | 3 refills | Status: DC

## 2020-11-15 MED ORDER — FREE WATER
160.0000 mL | Freq: Four times a day (QID) | Status: DC
  Administered 2020-11-15 (×3): 160 mL

## 2020-11-15 NOTE — Progress Notes (Signed)
PT Cancellation Note  Patient Details Name: Ronald Murphy MRN: [id number] DOB: [birth year]   Cancelled Treatment:    Reason Eval/Treat Not Completed: Patient not medically ready (Pt went for g tube placement with IR, needs either updated PT orders or continuance. Will resume services once indicated.)  10:20 AM, 11/15/20 Etta Grandchild, PT, DPT Physical Therapist - [facility]  [phone] ([place])    Shey Bartmess C 11/15/2020, 10:20 AM

## 2020-11-15 NOTE — Plan of Care (Signed)
  Problem: Education: Goal: Knowledge of General Education information will improve Description: Including pain rating scale, medication(s)/side effects and non-pharmacologic comfort measures Outcome: Adequate for Discharge   Problem: Health Behavior/Discharge Planning: Goal: Ability to manage health-related needs will improve Outcome: Adequate for Discharge   Problem: Clinical Measurements: Goal: Ability to maintain clinical measurements within normal limits will improve Outcome: Adequate for Discharge Goal: Will remain free from infection Outcome: Adequate for Discharge Goal: Diagnostic test results will improve Outcome: Adequate for Discharge Goal: Respiratory complications will improve Outcome: Adequate for Discharge Goal: Cardiovascular complication will be avoided Outcome: Adequate for Discharge   Problem: Activity: Goal: Risk for activity intolerance will decrease Outcome: Adequate for Discharge   Problem: Nutrition: Goal: Adequate nutrition will be maintained Outcome: Adequate for Discharge   Problem: Coping: Goal: Level of anxiety will decrease Outcome: Adequate for Discharge   Problem: Elimination: Goal: Will not experience complications related to bowel motility Outcome: Adequate for Discharge Goal: Will not experience complications related to urinary retention Outcome: Adequate for Discharge   Problem: Pain Managment: Goal: General experience of comfort will improve Outcome: Adequate for Discharge   Problem: Safety: Goal: Ability to remain free from injury will improve Outcome: Adequate for Discharge   Problem: Skin Integrity: Goal: Risk for impaired skin integrity will decrease Outcome: Adequate for Discharge  Pt dc home

## 2020-11-15 NOTE — Progress Notes (Signed)
Nutrition Follow-up  DOCUMENTATION CODES:   Severe malnutrition in context of chronic illness  INTERVENTION:   -MVI with minerals daily -Magic cup TID with meals, each supplement provides 290 kcal and 9 grams of protein  -Hormel Shake TID with meals, each supplement provides 520 kcals and 22 grams protein  -Bolus TF via PEG:   355 ml Osmolite 1.2 QID via PEG  80 ml free water flush before and after each feeding administration  Tube feeding regimen provides 1710 kcal (95% of needs), 79 grams of protein, and 1170 ml of H2O.  Total free water: 1810 ml daily  NUTRITION DIAGNOSIS:   Severe Malnutrition related to cancer and cancer related treatments as evidenced by severe fat depletion, severe muscle depletion.  Ongoing  GOAL:   Patient will meet greater than or equal to 90% of their needs  Progressing   MONITOR:   PO intake, Supplement acceptance, Labs, Weight trends, Skin, I & O's, Diet advancement  REASON FOR ASSESSMENT:   Ventilator    ASSESSMENT:   60 y/o male with h/o SCC lung, laryngeal cancer s/p chemotherapy/XRT, radiation induced esophageal stricture s/p dilation, chronic trach, recurrent aspiration PNA, Hep C, anxiety, depression, etoh abuse (remission) and HTN who is admitted with lacunar infarct, CAP, septic shock and NSTEMI.  10/18 - admitted to ICU, ventilated via trach. NGT placed, feeds initiated 10/22 - completely weaned from vent 10/23 - transferred out of ICU 10/24- s/p BSE- advanced to dysphagia 1 diet with nectar thick liquids, NGT pulled, TF d/c 10/31- s/p PEG placement 11/1- bolus feeding initiated  Reviewed I/O's: +320 ml x 24 hours and -3.9 L since 11/01/20  Drain output: 500 ml x 24 hours  Pt still with very minimal oral intake. Meal completions 0%.   Per MD notes, pt no longer desires home hospice and wants to discharge home with palliative care follow-up.   Pt currently receiving bolus feedings of 237 ml Ensure Enlive TID. Regimen  meets 1050 kcals and 60 grams protein, which does not meet nutritional needs. RD to modify TF regimen. RD notifed RN about feeding changes.   Per TOC notes, awaiting accepting home health agency.    Labs reviewed: Mg: 1.6. CBGS: 92.   Diet Order:   Diet Order             Diet NPO time specified  Diet effective midnight                   EDUCATION NEEDS:   No education needs have been identified at this time  Skin:  Skin Assessment: Skin Integrity Issues: Skin Integrity Issues:: Other (Comment), Stage I Stage I: lt hip, rt knee, lt shoulder Other: IAD buttocks  Last BM:  11/12/20  Height:   Ht Readings from Last 1 Encounters:  10/30/20 5\' 3"  (1.6 m)    Weight:   Wt Readings from Last 1 Encounters:  11/14/20 71 kg    Ideal Body Weight:  56.36 kg  BMI:  Body mass index is 27.73 kg/m.  Estimated Nutritional Needs:   Kcal:  1800-2000  Protein:  85-100 grams  Fluid:  > 1.8    Loistine Chance, RD, LDN, [place] Registered Dietitian II Certified Diabetes Care and Education Specialist Please refer to [facility] for RD and/or RD on-call/weekend/after hours pager

## 2020-11-15 NOTE — Discharge Instructions (Signed)
Tracheostomy care per instructions peg tube care per RN instructions

## 2020-11-15 NOTE — TOC Transition Note (Signed)
Transition of Care [facility]) - CM/SW Discharge Note   Patient Details  Name: Ronald Murphy MRN: [id number] Date of Birth: 1960-11-14  [facility] of [facility] CM/SW Contact:  Alberteen Sam, LCSW Phone Number: 11/15/2020, 2:24 PM   Clinical Narrative:     Patient to discharge home today with outpatient PT, OT and Speech. Son agreeable to provide transportation to these appointments, with preference for therapies in [facility] main campus. CSW has faxed referral to [facility] therapies to [phone].   [facility] to deliver hospital bed today, and patient to be transported home via [facility] once bed delivered in home. Son to inform CSW when bed is in home and then transport will be called.   No other discharge needs identified at this time.    Final next level of care: Home/Self Care Barriers to Discharge: No Barriers Identified   Patient Goals and CMS Choice Patient states their goals for this hospitalization and ongoing recovery are:: to go home CMS Medicare.gov Compare Post Acute Care list provided to:: Patient Represenative (must comment) (son Froilan) Choice offered to / list presented to : Adult Children  Discharge Placement                Patient to be transferred to facility by: [facility] Name of family member notified: son Patient and family notified of of transfer: 11/15/20  Discharge Plan and Services                DME Arranged: Hospital bed DME Agency: [employer] Date DME Agency Contacted: 11/15/20 Time DME Agency Contacted: 62 Representative spoke with at DME Agency: [place] ([place]) Interventions     Readmission Risk Interventions Readmission Risk Prevention Plan 11/03/2020  Transportation Screening Complete  Medication Review Press photographer) Not Complete  Med Review Comments Spoke with Son who had to hang up to take care of personal business.  PCP or Specialist appointment within 3-5 days of discharge Not Complete   PCP/Specialist Appt Not Complete comments NMS for discharge  [place] or [place] Complete  SW Recovery Care/Counseling Consult Complete  Palliative Care Screening Not Applicable  Skilled Nursing Facility Complete  Some recent data might be hidden
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
## 2020-11-15 NOTE — Procedures (Signed)
Bedside Tracheostomy Change Procedure Note   Patient Details:   Name: Ronald Murphy DOB: 24-Nov-1960 MRN: [id number]  Procedure: Tracheostomy Change  Post Procedure Assessment: BP 93/61   Pulse 80   Temp 98 F (36.7 C) (Oral)   Resp 20   Ht 5\' 3"  (1.6 m)   Wt 71 kg   SpO2 93%   BMI 27.73 kg/m  O2 sats: stable throughout Complications: No apparent complications Patient did tolerate procedure well Tracheostomy Brand:Shiley Tracheostomy Style:Uncuffed Tracheostomy Size:  6 Tracheostomy Secured GIT:[id number]   Good air entry bilaterally post change.  PMV place with good verbalization and cough.      Rayne Du Gwendolen Hewlett 11/15/2020, 3:28 PM

## 2020-11-15 NOTE — Progress Notes (Addendum)
Speech Language Pathology Treatment: Passy Muir Speaking valve  Patient Details Name: Ronald Murphy MRN: [id number] DOB: 04/04/1960 Today's Date: 11/15/2020 Time: 1240-1305 SLP Time Calculation (min) (ACUTE ONLY): 25 min  Assessment / Plan / Recommendation Clinical Impression  Pt seen for f/u PMV tx session for toleration of PMV wear/use and education w/ use/wear/cleaning. Pt continues on TC O2 support of 5L Baseline. Pt was alert, responsive to direct questions during session; few verbal cues intermittently for direction and follow through w/ general instructions/precautions. Some responses seemed min slow, basic. Did not engage in spontaneous conversation w/ SLP but answered basic questions when asked. Responses were brief.    Explained the use and wear of the PMV to pt -- troubleshooting/cleaning. Pt did not have his PMV in place upon entering room. Trach and stoma area inspected; ensured Cuff was deflated placing the PMV. Cuff deflated at baseline; slight secretions. Pt's respiratory effort appeared WFL. RR 20, O2 sats 98%, HR 84-89. Pt verbalized in response to general questions re: his PMV wear during the day at home. He verbalized understanding of the need to Remove it if resting or sleeping. Verbalizations were c/b adequate in volume and intelligibility w/ fair-good breath support/effort, though min gravely. Encouraged pt to focus on using good breath support to support his speech/volume. PMV placement was tolerated during sessions w/out change in respiratory effort from his baseline; no overt use of accessary muscles or distress was noted in his breathing pattern. No overt discomfort noted in his respiratory effort -- pt stated it felt "fine" to wear/talk w/ PMV on. PMV was allowed to remain on when SLP left room.    Pt appears to adequately tolerate PMV placement w/out overt, gross respiratory discomfort or distress; ANS remained adequately stable but suspect min change from his baseline(?) -  he was at home independent w/ all ADLs per chart notes/report though Son had moved in w/ pt/wife. Education was given on PMV use/wear, MUST have Cuff deflation for PMV wear, checking and removing the air from the balloon b/f placing, placing/removing the PMV, and care of the PMV. Discussed that is MUST be worn during work w/ Therapies(OT, PT). Must NOT be worn when sleeping. Encouraged Rest Breaks at times during the day. Precautions posted at bedside and in chart.   Pt remains w/ a Cuffed trach during his wean at this time. Sticker on Cuff line, and in room. Recommend f/u w/ MD and ENT to change to a Cuffless trach IF not using a nocturnal vent at home. MD/CM/NSG made aware. ST services will Sign Off at this time as pt appears at his Baseline w/ PMV wear.    Addendum: d/t degree of Dysphagia(see MBSS) and poor oral intake, pt chose to have a PEG placed for primary nutrition/hydration. Pt is currently NPO per MD order. Education given on frequent oral care for hygiene and stimulation of swallowing. Pt to f/u w/ skilled ST services for Dysphagia at Discharge.       HPI HPI: 60-year-old male with PMHx of laryngeal cancer status post tracheostomy presented to [facility] ED from home via EMS on 10/30/2020 due to his son's concern of altered mental status. 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  Per   his son's report he last saw his father well around 1 PM on Monday, 10/30/2020.  His father lives with his wife, for whom he is caretaker, as she has dementia and is very hard of hearing.  When his son returned from work after 8 PM on  Monday, 10/30/2020 his father appeared altered, unable to follow commands and unable to speak to him. Per his son his father is ambulatory, alert and oriented x4 and able to care for himself.  Per his son's report patient does have a history of alcoholism but currently only drinks nonalcoholic beer.  He is not aware of any previous stroke history.   CXR - Substantially regressed left lung opacity with mild residual.  No new cardiopulmonary abnormality; CT head - Bilateral basal ganglia lacunar infarcts, age indeterminate but favor chronic -- chronic changes (see all Imaging in chart including 05/2020 incuding CT and PET scans in 2022).  Pt placed on vent support post admit d/t status and was felt to have multifactorial encephalopathy versus diffuse anoxic injury in the setting of possible cardiac event given his troponinemia, and started on heparin drip for concern for ACS.  Per CCU/Pulmonogy note: dx includes Pneumonia with severe hypoxic resp failure with septic shock and acute ischemic cardiomyopathy with NSTEMI with acute renal failure and liver failure and acidosis.  Weaned from vent support on 11/04/2020 and remains on Trach Collar O2 support, tracheostomy.  MBSS completed 11/09/2020 w/ Dysphagia+, aspiration+. See eval note.  Pt has received a PEG placement d/t dysphagia; he is NPO w/ PEG TFs (11/15/2020).      SLP Plan  All goals met      Recommendations for follow up therapy are one component of a multi-disciplinary discharge planning process, led by the attending physician.  Recommendations may be updated based on patient status, additional functional criteria and insurance authorization.    Recommendations  Diet recommendations: NPO (w/ PEG TFs) Medication Administration: Via alternative means (PEG)      Patient may use Passy-Muir Speech Valve: During all therapies with supervision;During all waking hours (remove during sleep) PMSV Supervision: Intermittent MD: Please consider changing  trach tube to : Cuffless (IF pt is not going to need nocturnal vent at home)         General recommendations:  (f/u w/ ST if new needs re: GOC/POC; pt was wearing PMV as at his Baseline) Oral Care Recommendations: Oral care QID;Patient independent with oral care (support) Follow up Recommendations:  (TBD) SLP Visit Diagnosis: Dysphagia, oropharyngeal phase (R13.12);Aphonia (R49.1) (Traceheostomy) Plan: All goals met       GO                 Katherine Watson, MS, CCC-SLP Speech Language Pathologist Rehab Services [phone] Watson,Katherine  11/15/2020, 2:33 PM 

## 2020-11-15 NOTE — Progress Notes (Signed)
Patient's airway, tracheostomy, was suctioned. There was a moderate amount of greenish tan thick secretions. Patient had no issues with trach care or suctioning.

## 2020-11-22 ENCOUNTER — Telehealth: Admit: 2020-11-22 | Discharge: 2020-11-23 | Payer: MEDICARE

## 2020-11-22 DIAGNOSIS — C3402 Malignant neoplasm of left main bronchus: Principal | ICD-10-CM

## 2020-11-22 DIAGNOSIS — Z515 Encounter for palliative care: Principal | ICD-10-CM

## 2020-11-22 DIAGNOSIS — C3490 Malignant neoplasm of unspecified part of unspecified bronchus or lung: Principal | ICD-10-CM

## 2020-11-23 ENCOUNTER — Telehealth: Payer: Self-pay | Admitting: Nurse Practitioner

## 2020-11-23 NOTE — Telephone Encounter (Signed)
Spoke with patient regarding the Palliative referral/services and he was in agreement with scheduling visit with NP.  I have scheduled an In-home Consult for 12/13/20 @ 12:30 PM

## 2020-11-29 ENCOUNTER — Ambulatory Visit: Admit: 2020-11-29 | Payer: MEDICARE | Attending: Hematology & Oncology | Primary: Hematology & Oncology

## 2020-12-08 MED ORDER — OXYCODONE 15 MG TABLET
ORAL_TABLET | ORAL | 0 refills | 5.00000 days | Status: CP | PRN
Start: 2020-12-08 — End: 2020-12-13

## 2020-12-11 DIAGNOSIS — G893 Neoplasm related pain (acute) (chronic): Principal | ICD-10-CM

## 2020-12-13 ENCOUNTER — Encounter: Payer: Self-pay | Admitting: Nurse Practitioner

## 2020-12-13 ENCOUNTER — Telehealth: Payer: Self-pay

## 2020-12-13 ENCOUNTER — Other Ambulatory Visit: Payer: Self-pay

## 2020-12-13 ENCOUNTER — Other Ambulatory Visit: Payer: Self-pay | Admitting: Nurse Practitioner

## 2020-12-13 DIAGNOSIS — Z515 Encounter for palliative care: Secondary | ICD-10-CM

## 2020-12-13 DIAGNOSIS — G893 Neoplasm related pain (acute) (chronic): Secondary | ICD-10-CM

## 2020-12-13 DIAGNOSIS — R5381 Other malaise: Secondary | ICD-10-CM

## 2020-12-13 NOTE — Progress Notes (Signed)
[facility] Palliative Care Consult Note Telephone: [phone]  Fax: [phone]   Date of encounter: 12/13/20 4:17 PM PATIENT NAME: Ronald Murphy [place] [address]   [id number] (home)  DOB: [birth year] MRN: [id number] PRIMARY CARE PROVIDER:    Romualdo Bolk, FNP,  [address] [place] [address] [phone]  REFERRING PROVIDER:   Quay Burow FNP/[facility]  RESPONSIBLE PARTY:    Contact Information     Name Relation Home Work Mobile   Oliphant,Jasdeep Carolin Murphy   [phone]   [place] Daughter   [phone]   Modeste,Joey Pandora Leiter   [phone]      I met face to face with patient and family in home. Palliative Care was asked to follow this patient by consultation request of Quay Burow FNP to address advance care planning and complex medical decision making. This is the initial visit.                 ASSESSMENT AND PLAN / RECOMMENDATIONS:  Advance Care Planning/Goals of Care: Goals include to maximize quality of life and symptom management. Patient/health care surrogate gave his/her permission to discuss.Our advance care planning conversation included a discussion about:    The value and importance of advance care planning  Experiences with loved ones who have been seriously ill or have died  Exploration of personal, cultural or spiritual beliefs that might influence medical decisions  Exploration of goals of care in the event of a sudden injury or illness  Identification and preparation of a healthcare agent  Review and updating or creation of an  advance directive document . Decision not to resuscitate or to de-escalate disease focused treatments due to poor prognosis. CODE STATUS: Full code  Symptom Management/Plan: 1. Advance Care Planning; Full code, full scope of treatment  2. Goals of Care: Goals include to maximize quality of life and symptom management. Our advance care planning conversation  included a discussion about:    The value and importance of advance care planning  Exploration of personal, cultural or spiritual beliefs that might influence medical decisions  Exploration of goals of care in the event of a sudden injury or illness  Identification and preparation of a healthcare agent  Review and updating or creation of an advance directive document.  3. 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 Continue current regimen with Oxycodone 76m q4hrs as needed for pain [facility] DCamperis able [facility]   4. 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 Continue to ambulate using cane.   5. Palliative care encounter; Palliative care encounter; Palliative medicine team will continue to support patient, patient's family, and medical team. Visit consisted of counseling and education dealing with the complex and emotionally intense issues of symptom management and palliative care in the setting of serious and potentially life-threatening illness  6. f/u 2 month for ongoing monitoring chronic disease progression, ongoing discussions complex medical decision making  Follow up Palliative Care Visit: Palliative care will continue to follow for complex medical decision making, advance care planning, and clarification of goals. Return 8 weeks or prn.  I spent 78 minutes  providing this consultation. More than 50% of the time in this consultation was spent in counseling and care coordination. PPS: 40%  Chief Complaint: Initial palliative consult for complex medical decision  making  HISTORY OF PRESENT ILLNESS:  Ronald Murphy is a 60 y.o. 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 Ronald Murphy did require intubation. Ronald Murphy was d/c home with out patient PT/OT/Speech as well as to continue to f/u with [facility]. Called Ronald Murphy to confirm initial [facility] visit, with covid screening negative. I visited Mr and Mrs Murphy with son Lavel in their home. Ronald Murphy was sitting in the recliner in his living room with hospital bed next to him. We talked about the last time he was independent, life review included married >35 years to [place] who was present. 3 Adult children also involved in case and son Tiago does live with Mr and Mrs Cookson. We talked about past medical history, dx cancer with treatements received, hospitalizations, prior and current functional abilities. Ronald Murphy endorses he is able to stand up out of his recliner with assistance, ambulate few steps with cane. Ronald Murphy does require assistance with bathing. We talked about medications reviewed and given via g-tube. We talked about nutrition as he receives most through g-tube but continues oral intake. Ronald Murphy endorses he has not had to  change the texture of the food, discussed dysphagia, risk. We talked about speech working with Mr. Goldsborough. We talked about symptoms including pain management at length. Limited with community PC resources best plan as Mr. Lennox continues to go in person to [facility] appointment will continue PC clinic at same time with limiting less travel to another appointment at [facility] monthly for UDS. We talked about home PC, expectations and resources. We talked about current Mr. Osmond is comfortable. We talked about their parakeet "tweety" is in a cage in front of Mr. Bohorquez. We talked about medical goals including code status, verbalizes full treatment, full code. We talked about family dynamics. We talked about f/u pc visit in 8 weeks with PC NP from [facility], recommend [facility] with next Oncology visit so no lapse in pain management. Will have home PC RN contact for f/u visit 4 weeks. Mr and Mrs Terion, Hedman in agreement. Therapeutic listening, emotional support provided. Questions answered.   History obtained from review of EMR, discussion with [name] son, Mrs and  Mr. Dawayne Patricia.  I reviewed available labs, medications, imaging, studies and related documents from the EMR.  Records reviewed and summarized above.   ROS Full 10 system review of systems performed and negative with exception of: as per HPI.   Physical Exam: Constitutional: NAD General: frail appearing, thin, pleasant chronically ill male EYES: lids intact ENMT: oral mucous membranes moist; trach CV: S1S2, RRR, no LE edema Pulmonary: decrease bases, no increased work of breathing, no cough, room air Abdomen: intake 100%,  normo-active BS + 4 quadrants, soft and non tender; g-tube MSK: steps with cane Skin: warm and dry Neuro:  + generalized weakness,  no cognitive impairment Psych: non-anxious affect, A and O x 3  CURRENT PROBLEM LIST:  Patient Active Problem List   Diagnosis Date Noted   Hypernatremia    Pneumonia due to methicillin  susceptible Staphylococcus aureus (MSSA) (HCC)    Protein-calorie malnutrition, severe 11/01/2020   Tracheostomy status ([place]) 61/95/0932   Acute metabolic encephalopathy 67/12/4578   History of laryngeal cancer 10/31/2020   Acute renal failure ([place]) 10/31/2020   NSTEMI (non-ST elevated myocardial infarction) ([place]) 10/31/2020   Small cell carcinoma of lung ([place]) 10/31/2020   Radiation-induced esophageal stricture 10/31/2020   Chronic, continuous use of opioids 10/31/2020   Chronic hyponatremia 10/31/2020   Acute hepatitis 10/31/2020   Acute respiratory failure ([place]) 10/31/2020   Altered mental status 10/31/2020   Pressure injury of skin 10/31/2020   Larynx cancer ([place]) 09/02/2017   Chronic hepatitis C without hepatic coma ([place]) 09/02/2017   PAST MEDICAL HISTORY:  Active Ambulatory Problems    Diagnosis Date Noted   Larynx cancer ([place]) 09/02/2017   Chronic hepatitis C without hepatic coma ([place]) 09/02/2017   Tracheostomy status ([place]) 99/83/3825   Acute metabolic encephalopathy 05/39/7673   History of laryngeal cancer 10/31/2020   Acute renal failure ([place]) 10/31/2020   NSTEMI (non-ST elevated myocardial infarction) ([place]) 10/31/2020   Small cell carcinoma of lung ([place]) 10/31/2020   Radiation-induced esophageal stricture 10/31/2020   Chronic, continuous use of opioids 10/31/2020   Chronic hyponatremia 10/31/2020   Acute hepatitis 10/31/2020   Acute respiratory failure ([place]) 10/31/2020   Altered mental status 10/31/2020   Pressure injury of skin 10/31/2020   Protein-calorie malnutrition, severe 11/01/2020   Pneumonia due to methicillin susceptible Staphylococcus aureus (MSSA) ([place])    Hypernatremia    Resolved Ambulatory Problems    Diagnosis Date Noted   No Resolved Ambulatory Problems   Past Medical History:  Diagnosis Date   Alcoholism in remission ([place])    Anxiety    Depression    Hepatitis C    Hypertension    Kidney stone    SOCIAL HX:  Social History   Tobacco  Use   Smoking status: Former    Types: Cigarettes    Quit date: 2015    Years since quitting: 7.9   Smokeless tobacco: Never  Substance Use Topics   Alcohol use: Not Currently    Comment: quit 2015, prior use 12-15 beverages / day   FAMILY HX:  Family History  Adopted: Yes    reviewed  ALLERGIES: No Known Allergies   PERTINENT MEDICATIONS:  Outpatient Encounter Medications as of 12/13/2020  Medication Sig   aspirin 81 MG chewable tablet Place 1 tablet (81 mg total) into feeding tube daily.   clopidogrel (PLAVIX) 75 MG tablet Take 1 tablet (75 mg total) by mouth daily.   docusate (COLACE) 50 MG/5ML liquid Place 10 mLs (100 mg total) into feeding tube daily as needed for mild constipation.   escitalopram (LEXAPRO) 20 MG tablet Place 1 tablet (20 mg total) into feeding tube daily.   EUTHYROX 50 MCG tablet Take 50 mcg by mouth daily.   guaiFENesin (ROBITUSSIN) 100 MG/5ML liquid Place 5 mLs into feeding tube every 4 (four) hours as needed for cough or to loosen phlegm.   losartan (COZAAR) 25 MG tablet Place 1 tablet (25 mg total) into feeding tube daily.   metoprolol tartrate (LOPRESSOR) 25 MG tablet  Place 1 tablet (25 mg total) into feeding tube 2 (two) times daily.   Multiple Vitamin (MULTIVITAMIN WITH MINERALS) TABS tablet Place 1 tablet into feeding tube daily.   Nutritional Supplements (FEEDING SUPPLEMENT, OSMOLITE 1.2 CAL,) LIQD Place 355 mLs into feeding tube 4 (four) times daily.   omeprazole (PRILOSEC) 40 MG capsule Take 1 capsule (40 mg total) by mouth daily.   oxyCODONE (ROXICODONE) 15 MG immediate release tablet Place 1 tablet (15 mg total) into feeding tube every 4 (four) hours as needed (pain).   polyethylene glycol (MIRALAX / GLYCOLAX) 17 g packet Place 17 g into feeding tube daily as needed for moderate constipation.   Water For Irrigation, Sterile (FREE WATER) SOLN Place 160 mLs into feeding tube 4 (four) times daily.   No facility-administered encounter medications on  file as of 12/13/2020.  Questions and concerns were addressed. The patient/family was encouraged to call with questions and/or concerns. My business card was provided. Provided general support and encouragement, no other unmet needs identified  Thank you for the opportunity to participate in the care of Mr. Regala.  The palliative care team will continue to follow. Please call our office at [phone] if we can be of additional assistance.   This chart was dictated using voice recognition software.  Despite best efforts to proofread,  errors can occur which can change the documentation meaning.   Tirzah Fross Z Kiah Vanalstine, NP ,   COVID-19 PATIENT SCREENING TOOL Asked and negative response unless otherwise noted:  Have you had symptoms of covid, tested positive or been in contact with someone with symptoms/positive test in the past 5-10 days? NO

## 2020-12-13 NOTE — Telephone Encounter (Signed)
Call to patient to remind of 1230 appt today with Gusler NP from [place] palliative.  Patient answered phone and was able to answer questions and give information. Per patient he is still seeing palliative clinic at [facility] and has appt next month.  He has Oxycodone in the home for throat pain and does not need refill at this time. Explained to patient that palliative has a procedure for prescribing pain meds which would include drug screen and contract to be in place prior to prescribing.   Patient uses cane in home to walk and has been eating ok.  No immediate needs at this time, information relayed to NP

## 2020-12-19 ENCOUNTER — Telehealth: Payer: Self-pay

## 2020-12-19 NOTE — Telephone Encounter (Signed)
145 pm. Request received from [place], NP needing narcotic count for MS Contin and Oxycodone so next visit can be scheduled at [facility].  Phone call made to patient to schedule a home visit for tomorrow.  Patient states he will be seeing Quay Burow, NP tomorrow.  No visit is needed at this time.  I will notify Christin Gusler, NP of above.

## 2020-12-27 ENCOUNTER — Ambulatory Visit: Admit: 2020-12-27 | Discharge: 2020-12-28 | Payer: MEDICARE

## 2020-12-27 DIAGNOSIS — C3402 Malignant neoplasm of left main bronchus: Principal | ICD-10-CM

## 2020-12-27 DIAGNOSIS — R070 Pain in throat: Principal | ICD-10-CM

## 2020-12-27 DIAGNOSIS — R5381 Other malaise: Secondary | ICD-10-CM | POA: Diagnosis not present

## 2020-12-27 DIAGNOSIS — Z515 Encounter for palliative care: Secondary | ICD-10-CM | POA: Diagnosis not present

## 2020-12-27 DIAGNOSIS — M542 Cervicalgia: Secondary | ICD-10-CM | POA: Diagnosis not present

## 2020-12-27 DIAGNOSIS — R5383 Other fatigue: Secondary | ICD-10-CM | POA: Diagnosis not present

## 2020-12-27 MED ORDER — OXYCODONE 15 MG TABLET
ORAL_TABLET | Freq: Two times a day (BID) | ORAL | 0 refills | 30 days | Status: CP | PRN
Start: 2020-12-27 — End: 2021-01-26

## 2020-12-28 DIAGNOSIS — C3402 Malignant neoplasm of left main bronchus: Principal | ICD-10-CM

## 2020-12-28 DIAGNOSIS — C329 Malignant neoplasm of larynx, unspecified: Principal | ICD-10-CM

## 2021-01-31 ENCOUNTER — Ambulatory Visit: Admit: 2021-01-31 | Payer: MEDICARE

## 2021-01-31 ENCOUNTER — Ambulatory Visit: Admit: 2021-01-31 | Payer: MEDICARE | Attending: Hematology & Oncology | Primary: Hematology & Oncology

## 2021-02-01 DIAGNOSIS — C3402 Malignant neoplasm of left main bronchus: Principal | ICD-10-CM

## 2021-02-06 DIAGNOSIS — R070 Pain in throat: Principal | ICD-10-CM

## 2021-02-06 DIAGNOSIS — C3402 Malignant neoplasm of left main bronchus: Principal | ICD-10-CM

## 2021-02-06 MED ORDER — OXYCODONE 15 MG TABLET
ORAL_TABLET | Freq: Two times a day (BID) | ORAL | 0 refills | 30.00000 days | Status: CP | PRN
Start: 2021-02-06 — End: 2021-03-08

## 2021-02-22 ENCOUNTER — Other Ambulatory Visit: Payer: Self-pay

## 2021-02-22 ENCOUNTER — Encounter: Payer: Self-pay | Admitting: Nurse Practitioner

## 2021-02-22 ENCOUNTER — Other Ambulatory Visit: Payer: Self-pay | Admitting: Nurse Practitioner

## 2021-02-22 DIAGNOSIS — R5381 Other malaise: Secondary | ICD-10-CM | POA: Diagnosis not present

## 2021-02-22 DIAGNOSIS — Z515 Encounter for palliative care: Secondary | ICD-10-CM | POA: Diagnosis not present

## 2021-02-22 DIAGNOSIS — E43 Unspecified severe protein-calorie malnutrition: Secondary | ICD-10-CM | POA: Diagnosis not present

## 2021-02-22 NOTE — Progress Notes (Signed)
[place] Consult Note Telephone: [phone]  Fax: [phone]    Date of encounter: 62/09/23 4:36 PM PATIENT NAME: Ronald Murphy [place] [address]   [phone] (home)  DOB: [birth year] MRN: [id number] PRIMARY CARE PROVIDER:    Romualdo Bolk, FNP,  [address] [place] [address] [phone] RESPONSIBLE PARTY:    Contact Information     Name Relation Home Work Mobile   Woloszyn,Orva Carolin Coy   [phone]   Bui,Norma Daughter   [phone]   Holsworth,Joey Pandora Leiter   [phone]      Due to the COVID-19 crisis, this visit was done via telemedicine from my office and it was initiated and consent by this patient and or family.  I connected with  Drema Dallas OR PROXY on 02/22/21 by a telephone as video not available enabled telemedicine application and verified that I am speaking with the correct person.   I discussed the limitations of evaluation and management by telemedicine. The patient expressed understanding and agreed to proceed. . Palliative Care was asked to follow this patient by consultation request of  Romualdo Bolk, FNP to address advance care planning and complex medical decision making. This is a follow up visit.                                  ASSESSMENT AND PLAN / RECOMMENDATIONS:  Symptom Management/Plan: 1. Advance Care Planning; Full code, full scope of treatment; will f/u with [facility] palliative, with [facility]   05/02/2020 weight 145 lbs 12/27/2020 weight 139 lbs 11/15/2020 albumin 2.3; total protein 5.9   2. Goals of Care: Goals include to maximize quality of life and symptom management. Our advance care planning conversation included a discussion about:    The value and importance of advance care planning  Exploration of personal, cultural or spiritual beliefs that might influence medical decisions  Exploration of goals of care in the event of a sudden injury or illness   Identification and preparation of a healthcare agent  Review and updating or creation of an advance directive document.   3. 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 Continue current regimen for pain as prescribed by [facility]   4. 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 Palliative care encounter; Palliative care encounter; Palliative medicine team will continue to support patient, patient's family, and medical team. Visit consisted of counseling and education dealing with the complex and emotionally intense issues of symptom management and palliative care in the setting of serious and potentially life-threatening illness I spent 27 minutes providing this consultation. More than 50% of the time in this consultation was spent in counseling and care coordination.  PPS: 40%  Chief Complaint: Follow up palliative consult for complex medical decision making  HISTORY OF PRESENT ILLNESS:  Ronald Murphy is a 61 y.o. 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 I called Ronald Murphy for telephone telemedicine f/u pc visit. We talked about how he has been feeling, symptoms, ros, medical goals, poc. We talked about weakness, overall decline and baseline. Discussed will contact [facility] to see any new updates to goc with treatments completed by Ronald Murphy. Will have PC RN make visit in 1 to 2 weeks for further assessment.   History obtained from review of EMR, discussion with  Ronald Murphy.  I reviewed available labs, medications, imaging, studies and related documents from the EMR.  Records reviewed and summarized above.   ROS 10 point system reviewed all negative except HPI  Physical Exam: deferred Thank you for the opportunity to participate in the care of Ronald Murphy.  The palliative care team will continue to follow. Please call our office at [phone] if we can be of additional assistance.   Kerilyn Cortner Ihor Gully, NP

## 2021-02-28 ENCOUNTER — Telehealth: Payer: Self-pay

## 2021-02-28 NOTE — Telephone Encounter (Signed)
335 pm.  Message received from Natalia Leatherwood, NP that patient missed his [facility] appointment.  Requesting a follow up with patient.  I contacted patient and he advised that he was on the way to his appointment but his GPS malfunctioned.  He has spoken to [facility] and is scheduled for a visit next Wednesday.  I will make a home visit on 02/28/21 @ 11 am at the request of Christin Gusler, NP.

## 2021-03-05 ENCOUNTER — Other Ambulatory Visit: Payer: Self-pay | Admitting: Nurse Practitioner

## 2021-03-05 ENCOUNTER — Telehealth: Payer: Self-pay

## 2021-03-05 ENCOUNTER — Encounter: Payer: Self-pay | Admitting: Nurse Practitioner

## 2021-03-05 ENCOUNTER — Other Ambulatory Visit: Payer: Self-pay

## 2021-03-05 VITALS — BP 154/102 | HR 79 | Temp 97.7°F

## 2021-03-05 DIAGNOSIS — C3402 Malignant neoplasm of left main bronchus: Principal | ICD-10-CM

## 2021-03-05 DIAGNOSIS — R070 Pain in throat: Principal | ICD-10-CM

## 2021-03-05 DIAGNOSIS — Z515 Encounter for palliative care: Secondary | ICD-10-CM

## 2021-03-05 DIAGNOSIS — R5381 Other malaise: Secondary | ICD-10-CM

## 2021-03-05 DIAGNOSIS — G893 Neoplasm related pain (acute) (chronic): Secondary | ICD-10-CM

## 2021-03-05 DIAGNOSIS — E43 Unspecified severe protein-calorie malnutrition: Secondary | ICD-10-CM

## 2021-03-05 MED ORDER — OXYCODONE 15 MG TABLET
ORAL_TABLET | Freq: Two times a day (BID) | ORAL | 0 refills | 7 days | Status: CP | PRN
Start: 2021-03-05 — End: 2021-03-12

## 2021-03-05 NOTE — Telephone Encounter (Signed)
340 pm.  Incoming call from [place] with Dr. Deforest Hoyles office.  She advised there were several attempts to reach patient to have him follow up with Dr. Manuella Ghazi with [facility] but all were unsuccessful.  Advised patient may transition to hospice now.  Should patient decide to proceed with trac replacement he will need to contact Dr. Manuella Ghazi at [facility].  Contact # is [phone].

## 2021-03-05 NOTE — Progress Notes (Signed)
[place] Consult Note Telephone: [phone]  Fax: [phone]    Date of encounter: 03/05/21 12:09 PM PATIENT NAME: Ronald Murphy [place] [address]   [phone] (home)  DOB: 1960/02/27 MRN: [id number] PRIMARY CARE PROVIDER:    Romualdo Bolk, FNP,  [address] [place] [address] [phone]  RESPONSIBLE PARTY:    Contact Information     Name Relation Home Work Mobile   Poythress,Messiah Carolin Coy   [phone]   Bui,Norma Daughter   [phone]   Edgerly,Joey Pandora Leiter   [phone]      Due to the COVID-19 crisis, this visit was done via telemedicine from my office and it was initiated and consent by this patient and or family.  I connected with Vance Gather RN PC with  Drema Dallas OR PROXY on 03/05/21 by a video enabled telemedicine application and verified that I am speaking with the correct person using two identifiers.   I discussed the limitations of evaluation and management by telemedicine. The patient expressed understanding and agreed to proceed.   I met face to face with patient and family in home connecting with Vance Gather, RN. Palliative Care was asked to follow this patient by consultation request of  Romualdo Bolk, FNP to address advance care planning and complex medical decision making. This is a follow up visit.                                  ASSESSMENT AND PLAN / RECOMMENDATIONS:  Advance Care Planning/Goals of Care: Goals include to maximize quality of life and symptom management. Patient/health care surrogate gave his/her permission to discuss. Our advance care planning conversation included a discussion about:    The value and importance of advance care planning  Experiences with loved ones who have been seriously ill or have died  Exploration of personal, cultural or spiritual beliefs that might influence medical decisions  Exploration of goals of care in the event of a  sudden injury or illness  Identification of a healthcare agent-patient is uncertain if this is he son or daughter. Review of advance care planning. Decision not to resuscitate or to de-escalate disease focused treatments due to poor prognosis. CODE STATUS:  Full  Symptom Management/Plan: ACP: discussed code scenerios incluidng CPR, Mr. Hargadon at this time wishes to remain a full code, though agreeable to ongoing discussions. We talked about Hospice benefit under Medicare. We talked about services provided, how the program works. Mr. Rodarte and family, son Ariez all in agreement. Hospice physicians in agreement with eligibility, Notified Fredric Dine NP Palliative [facility], in agreement. Will refill 1 week of Oxycodone then for Hospice Physicians to continue rx.  2. HTN: Elevated blood pressure this am.  He is unable to check blood pressures at home as he does not have a blood pressure cuff.  Patient endorses blood pressure medication has not been administered today.  Son Londen, Bok is managing medications and will administer. Education completed  3. Pain Management:  Patient endorses pain to his throat and lower back.  Currently taking oxycodone 15 mg po bid, discussed with Fredric Dine, NP [facility] will refill x 1 week then for Hospice Physician to assume management. Ongoing chronic pain, monitor on pain scale.   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  Patient endorses trach has been out for almost 2 months.  Wife  was attempting to change trach collar and trach was accidentally removed.  They were unable to re-insert and did not notify provider.  Dr. Deforest Hoyles office contacted on this visit to advise of situation. Mr. Lipa endorses his wishes not to have re-placed.  I spent 62 minutes providing this consultation. More than 50% of the time in this consultation was spent in counseling and care coordination  PPS: 40%  HOSPICE ELIGIBILITY/DIAGNOSIS: yes per Hospice Physicians  Chief Complaint: Follow up  palliative consult for complex medical decision making  HISTORY OF PRESENT ILLNESS:  FALCON MCCASKEY is a 61 y.o. year old male  with . 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  History obtained from review of EMR, discussion with primary team, and interview with family, facility staff/caregiver and/or Mr. Dawayne Patricia.  I reviewed available labs, medications, imaging, studies and related documents from the EMR.  Records reviewed and summarized above.   ROS General: NAD EYES: denies vision changes ENMT: + dysphagia Cardiovascular: denies chest pain, denies DOE Pulmonary: + cough, denies increased SOB Abdomen: endorses good appetite, denies constipation, endorses continence of bowel GU: denies dysuria, endorses continence of urine MSK:  denies increased weakness,  no falls reported Skin: denies rashes or wounds Neurological: + pain to throat and back, + insomnia Psych: Endorses positive mood Heme/lymph/immuno: + bruises, abnormal bleeding  Physical Exam: performed by Vance Gather RN PC Current and past weights:05/02/2020 weight 145 lbs 12/27/2020 weight 139 lbs 11/15/2020 albumin 2.3; total protein 5.9 Constitutional: NAD General: frail and thin appearing male EYES: lids intact ENMT: oral mucous membranes moist CV: S1S2, RRR, no LE edema Pulmonary: LCTA, no increased work of breathing, + cough, room air Abdomen: intake 50-75% oral intake for 1 meal daily, tube feeding 2x daily, normo-active BS + 4 quadrants, soft and non tender, no  ascites MSK: + sarcopenia, moves all extremities, ambulatory with a walker Skin: warm and dry Neuro:  + generalized weakness,  no cognitive impairment Psych: non-anxious affect, A and O x 3 Hem/lymph/immuno: bruising present to bilateral upper extremities.  Thank you for the opportunity to participate in the care of Mr. Hickam.  The palliative care team will continue to follow. Please call our office at [phone] if we can be of additional assistance.  Lorenza Burton, RN   COVID-19 PATIENT SCREENING TOOL Asked and negative response unless otherwise noted:   Have you had symptoms of covid, tested positive or been in contact with someone with symptoms/positive test in the past 5-10 days?

## 2021-03-05 NOTE — Addendum Note (Signed)
Addended by: Shawn Stall on: 03/05/2021 02:18 PM   Modules accepted: Level of Service

## 2021-03-05 NOTE — Progress Notes (Deleted)
PATIENT NAME: Ronald Murphy DOB: [birth year] MRN: [id number]  PRIMARY CARE PROVIDER: Romualdo Bolk, FNP  RESPONSIBLE PARTY:  Acct ID - Guarantor Home Phone Work Phone Relationship Acct Type  [id number] - Perry,Hazael [phone]  Self P/F     [address], [place], [place] [address]    PLAN OF CARE and INTERVENTIONS:               1.  GOALS OF CARE/ ADVANCE CARE PLANNING:  ***               2.  PATIENT/CAREGIVER EDUCATION:  ***               4. PERSONAL EMERGENCY PLAN:  Activate 911 for emergencies.                5.  DISEASE STATUS:***    HISTORY OF PRESENT ILLNESS:   Ronald Murphy is a 61 y.o. 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 (s/p completion of radiation treatments in June 2019 with chemotherapy begun but truncated secondary to tolerability, known C1 metastasis s/p CyberKnife radiation treatment and chemotherapy, most recently on durvalumab which was started 02/04/2019 with last infusion on 02/23/2020), Bilateral Basal ganglia lacunar infarct, hypertension, ETOH in remission, CKD, anxiety/depression, kidney stones, g-tube; trach  CODE STATUS: Full ADVANCED DIRECTIVES: N MOST FORM: No PPS: 50%   PHYSICAL EXAM:   VITALS: Today's Vitals   03/05/21 1111  BP: (!) 154/102  Pulse: 79  Temp: 97.7 F (36.5 C)  SpO2: 96%    LUNGS: {SYSTEM LUNGS ADULT/PED EXAM:21906} CARDIAC: Cor RRR}  EXTREMITIES: - for edema SKIN: Skin color, texture, turgor normal. No rashes or lesions or scattered bruising on bilateral arms NEURO: positive for gait problems       Lorenza Burton, RN

## 2021-03-06 ENCOUNTER — Telehealth: Payer: Self-pay | Admitting: Nurse Practitioner

## 2021-03-06 NOTE — Telephone Encounter (Signed)
I called Ronald Creamer, NP office to obtain a hospice order, message left with contact information

## 2021-03-07 ENCOUNTER — Telehealth: Payer: Self-pay | Admitting: Nurse Practitioner

## 2021-03-07 NOTE — Telephone Encounter (Signed)
I called Sharyn Creamer NP office, receive verbal order for hospice referral, notified [facility] hospice

## 2021-03-09 DIAGNOSIS — R070 Pain in throat: Principal | ICD-10-CM

## 2021-03-09 DIAGNOSIS — C3402 Malignant neoplasm of left main bronchus: Principal | ICD-10-CM

## 2021-03-09 MED ORDER — OXYCODONE 15 MG TABLET
ORAL_TABLET | Freq: Four times a day (QID) | ORAL | 0 refills | 8 days | Status: CP | PRN
Start: 2021-03-09 — End: 2022-03-09

## 2021-03-21 ENCOUNTER — Ambulatory Visit: Admit: 2021-03-21 | Payer: MEDICARE

## 2021-04-21 MED ORDER — ESCITALOPRAM 20 MG TABLET
ORAL_TABLET | 0 refills | 0 days
Start: 2021-04-21 — End: ?

## 2021-04-21 MED ORDER — LOSARTAN 25 MG TABLET
ORAL_TABLET | 0 refills | 0 days
Start: 2021-04-21 — End: ?

## 2021-04-23 MED ORDER — LOSARTAN 25 MG TABLET
ORAL_TABLET | 0 refills | 0 days | Status: CP
Start: 2021-04-23 — End: ?

## 2021-04-23 MED ORDER — ESCITALOPRAM 20 MG TABLET
ORAL_TABLET | 0 refills | 0 days | Status: CP
Start: 2021-04-23 — End: ?

## 2021-06-14 DIAGNOSIS — R7989 Other specified abnormal findings of blood chemistry: Principal | ICD-10-CM

## 2021-06-14 MED ORDER — ESCITALOPRAM 20 MG TABLET
ORAL_TABLET | Freq: Every day | ORAL | 3 refills | 30.00000 days
Start: 2021-06-14 — End: ?

## 2021-06-14 MED ORDER — CLOPIDOGREL 75 MG TABLET
ORAL_TABLET | Freq: Every day | ORAL | 1 refills | 90.00000 days
Start: 2021-06-14 — End: ?

## 2021-06-14 MED ORDER — METOPROLOL TARTRATE 25 MG TABLET
ORAL_TABLET | Freq: Two times a day (BID) | GASTROSTOMY | 1 refills | 90 days | Status: CP
Start: 2021-06-14 — End: ?

## 2021-06-14 MED ORDER — LOSARTAN 25 MG TABLET
ORAL_TABLET | Freq: Every day | ORAL | 2 refills | 90.00000 days | Status: CP
Start: 2021-06-14 — End: ?

## 2021-06-14 MED ORDER — LEVOTHYROXINE 50 MCG TABLET
ORAL_TABLET | Freq: Every day | ORAL | 2 refills | 90 days | Status: CP
Start: 2021-06-14 — End: 2022-06-14

## 2021-06-15 MED ORDER — ESCITALOPRAM 20 MG TABLET
ORAL_TABLET | Freq: Every day | ORAL | 3 refills | 30 days | Status: CP
Start: 2021-06-15 — End: ?

## 2021-06-15 MED ORDER — CLOPIDOGREL 75 MG TABLET
ORAL_TABLET | Freq: Every day | ORAL | 1 refills | 90.00000 days | Status: CP
Start: 2021-06-15 — End: ?

## 2021-06-28 DIAGNOSIS — R569 Unspecified convulsions: Secondary | ICD-10-CM | POA: Diagnosis not present

## 2021-06-28 DIAGNOSIS — R001 Bradycardia, unspecified: Secondary | ICD-10-CM | POA: Diagnosis not present

## 2021-07-25 MED ORDER — ESCITALOPRAM 20 MG TABLET
ORAL_TABLET | 3 refills | 0 days
Start: 2021-07-25 — End: ?

## 2021-07-25 MED ORDER — IBUPROFEN 200 MG TABLET
ORAL_TABLET | 0 refills | 0 days
Start: 2021-07-25 — End: ?

## 2021-07-25 MED ORDER — CLOPIDOGREL 75 MG TABLET
ORAL_TABLET | 0 refills | 0 days
Start: 2021-07-25 — End: ?

## 2021-07-30 MED ORDER — ESCITALOPRAM 20 MG TABLET
ORAL_TABLET | 3 refills | 0 days | Status: CP
Start: 2021-07-30 — End: ?

## 2021-07-30 MED ORDER — CLOPIDOGREL 75 MG TABLET
ORAL_TABLET | 0 refills | 0 days | Status: CP
Start: 2021-07-30 — End: ?

## 2021-07-30 MED ORDER — IBUPROFEN 200 MG TABLET
ORAL_TABLET | 0 refills | 0 days | Status: CP
Start: 2021-07-30 — End: ?

## 2021-09-06 MED ORDER — ESCITALOPRAM 20 MG TABLET
ORAL_TABLET | Freq: Every day | ORAL | 3 refills | 30 days | Status: CP
Start: 2021-09-06 — End: ?

## 2021-11-16 MED ORDER — CLOPIDOGREL 75 MG TABLET
ORAL_TABLET | Freq: Every day | ORAL | 0 refills | 30.00000 days | Status: CP
Start: 2021-11-16 — End: 2022-11-16

## 2021-11-16 MED ORDER — METOPROLOL TARTRATE 25 MG TABLET
ORAL_TABLET | Freq: Two times a day (BID) | GASTROSTOMY | 0 refills | 30.00000 days | Status: CP
Start: 2021-11-16 — End: 2022-11-16

## 2021-11-27 ENCOUNTER — Ambulatory Visit: Admit: 2021-11-27 | Discharge: 2021-11-28 | Payer: MEDICARE | Attending: Family | Primary: Family

## 2021-11-27 DIAGNOSIS — L298 Other pruritus: Principal | ICD-10-CM

## 2021-11-27 DIAGNOSIS — R7989 Other specified abnormal findings of blood chemistry: Principal | ICD-10-CM

## 2021-11-27 DIAGNOSIS — F32A Depression, unspecified depression type: Principal | ICD-10-CM

## 2021-11-27 DIAGNOSIS — C329 Malignant neoplasm of larynx, unspecified: Principal | ICD-10-CM

## 2021-11-27 DIAGNOSIS — R070 Pain in throat: Principal | ICD-10-CM

## 2021-11-27 DIAGNOSIS — C3402 Malignant neoplasm of left main bronchus: Principal | ICD-10-CM

## 2021-11-27 DIAGNOSIS — M62830 Muscle spasm of back: Principal | ICD-10-CM

## 2021-11-27 DIAGNOSIS — I10 Essential (primary) hypertension: Principal | ICD-10-CM

## 2021-11-27 MED ORDER — METOPROLOL TARTRATE 25 MG TABLET
ORAL_TABLET | Freq: Two times a day (BID) | GASTROSTOMY | 3 refills | 30 days | Status: CP
Start: 2021-11-27 — End: 2022-11-27

## 2021-11-27 MED ORDER — ESCITALOPRAM 20 MG TABLET
ORAL_TABLET | Freq: Every day | ORAL | 3 refills | 30 days | Status: CP
Start: 2021-11-27 — End: ?

## 2021-11-27 MED ORDER — PROCHLORPERAZINE MALEATE 10 MG TABLET
ORAL_TABLET | Freq: Three times a day (TID) | ORAL | 3 refills | 10 days | Status: CP | PRN
Start: 2021-11-27 — End: ?

## 2021-11-27 MED ORDER — ASPIRIN 81 MG CHEWABLE TABLET
ORAL_TABLET | Freq: Every day | ORAL | 0 refills | 90 days | Status: CP
Start: 2021-11-27 — End: ?

## 2021-11-27 MED ORDER — OMEPRAZOLE 40 MG CAPSULE,DELAYED RELEASE
ORAL_CAPSULE | Freq: Every day | ORAL | 1 refills | 90 days | Status: CP
Start: 2021-11-27 — End: ?

## 2021-11-27 MED ORDER — CLOPIDOGREL 75 MG TABLET
ORAL_TABLET | Freq: Every day | ORAL | 1 refills | 30 days | Status: CP
Start: 2021-11-27 — End: 2022-11-27

## 2021-11-27 MED ORDER — LOSARTAN 25 MG TABLET
ORAL_TABLET | Freq: Every day | ORAL | 3 refills | 30 days | Status: CP
Start: 2021-11-27 — End: ?

## 2021-11-27 MED ORDER — POLYETHYLENE GLYCOL 3350 17 GRAM/DOSE ORAL POWDER
Freq: Once | GASTROSTOMY | 0 refills | 15 days | Status: CP
Start: 2021-11-27 — End: 2021-11-27

## 2021-11-27 MED ORDER — OXYCODONE 15 MG TABLET
ORAL_TABLET | Freq: Four times a day (QID) | ORAL | 0 refills | 8 days | Status: CP | PRN
Start: 2021-11-27 — End: 2022-11-27

## 2021-11-27 MED ORDER — LEVOTHYROXINE 50 MCG TABLET
ORAL_TABLET | Freq: Every day | ORAL | 2 refills | 90 days | Status: CP
Start: 2021-11-27 — End: 2022-11-27

## 2021-12-10 DIAGNOSIS — I10 Essential (primary) hypertension: Principal | ICD-10-CM

## 2021-12-10 MED ORDER — METOPROLOL TARTRATE 25 MG TABLET
ORAL_TABLET | 0 refills | 0 days
Start: 2021-12-10 — End: ?

## 2021-12-10 MED ORDER — CLOPIDOGREL 75 MG TABLET
ORAL_TABLET | Freq: Every day | ORAL | 0 refills | 30 days
Start: 2021-12-10 — End: ?

## 2022-01-02 DIAGNOSIS — F32A Depression, unspecified depression type: Principal | ICD-10-CM

## 2022-01-02 DIAGNOSIS — I10 Essential (primary) hypertension: Principal | ICD-10-CM

## 2022-01-02 MED ORDER — METOPROLOL TARTRATE 25 MG TABLET
ORAL_TABLET | 0 refills | 0 days | Status: CP
Start: 2022-01-02 — End: ?

## 2022-01-02 MED ORDER — ESCITALOPRAM 20 MG TABLET
ORAL_TABLET | Freq: Every day | ORAL | 3 refills | 30 days
Start: 2022-01-02 — End: ?

## 2022-01-02 MED ORDER — CLOPIDOGREL 75 MG TABLET
ORAL_TABLET | Freq: Every day | ORAL | 0 refills | 30 days
Start: 2022-01-02 — End: ?

## 2022-01-03 MED ORDER — ESCITALOPRAM 20 MG TABLET
ORAL_TABLET | Freq: Every day | ORAL | 3 refills | 30 days | Status: CP
Start: 2022-01-03 — End: ?

## 2022-01-03 MED ORDER — CLOPIDOGREL 75 MG TABLET
ORAL_TABLET | Freq: Every day | ORAL | 0 refills | 30 days | Status: CP
Start: 2022-01-03 — End: ?

## 2022-01-17 DIAGNOSIS — I10 Essential (primary) hypertension: Principal | ICD-10-CM

## 2022-01-17 MED ORDER — METOPROLOL TARTRATE 25 MG TABLET
ORAL_TABLET | Freq: Two times a day (BID) | GASTROSTOMY | 1 refills | 90 days | Status: CP
Start: 2022-01-17 — End: 2023-01-17

## 2022-04-09 DIAGNOSIS — I10 Essential (primary) hypertension: Principal | ICD-10-CM

## 2022-04-09 DIAGNOSIS — L298 Other pruritus: Principal | ICD-10-CM

## 2022-04-09 DIAGNOSIS — M62830 Muscle spasm of back: Principal | ICD-10-CM

## 2022-04-09 MED ORDER — PROCHLORPERAZINE MALEATE 10 MG TABLET
ORAL_TABLET | Freq: Three times a day (TID) | ORAL | 3 refills | 0 days | PRN
Start: 2022-04-09 — End: ?

## 2022-04-09 MED ORDER — LOSARTAN 25 MG TABLET
ORAL_TABLET | Freq: Every day | ORAL | 3 refills | 0 days
Start: 2022-04-09 — End: ?

## 2022-04-10 MED ORDER — CLOPIDOGREL 75 MG TABLET
ORAL_TABLET | Freq: Every day | ORAL | 1 refills | 90 days
Start: 2022-04-10 — End: 2023-04-10

## 2022-04-10 MED ORDER — ASPIRIN 81 MG CHEWABLE TABLET
ORAL_TABLET | Freq: Every day | ORAL | 1 refills | 90 days
Start: 2022-04-10 — End: 2022-12-31

## 2022-04-10 MED ORDER — ESCITALOPRAM 20 MG TABLET
ORAL_TABLET | Freq: Every day | ORAL | 1 refills | 90 days
Start: 2022-04-10 — End: 2023-04-10

## 2022-04-10 MED ORDER — OMEPRAZOLE 40 MG CAPSULE,DELAYED RELEASE
ORAL_CAPSULE | Freq: Every day | ORAL | 1 refills | 90 days
Start: 2022-04-10 — End: 2023-04-10

## 2022-04-10 MED ORDER — LOSARTAN 25 MG TABLET
ORAL_TABLET | Freq: Every day | ORAL | 3 refills | 0.00000 days
Start: 2022-04-10 — End: 2023-04-10

## 2022-04-11 MED ORDER — ASPIRIN 81 MG CHEWABLE TABLET
ORAL_TABLET | Freq: Every day | ORAL | 1 refills | 90 days | Status: CP
Start: 2022-04-11 — End: 2023-01-01

## 2022-04-11 MED ORDER — ESCITALOPRAM 20 MG TABLET
ORAL_TABLET | Freq: Every day | ORAL | 1 refills | 90 days | Status: CP
Start: 2022-04-11 — End: 2023-04-11

## 2022-04-11 MED ORDER — CLOPIDOGREL 75 MG TABLET
ORAL_TABLET | Freq: Every day | ORAL | 1 refills | 90 days | Status: CP
Start: 2022-04-11 — End: 2023-04-11

## 2022-04-11 MED ORDER — PROCHLORPERAZINE MALEATE 10 MG TABLET
ORAL_TABLET | Freq: Three times a day (TID) | ORAL | 3 refills | 10 days | Status: CP | PRN
Start: 2022-04-11 — End: ?

## 2022-04-11 MED ORDER — OMEPRAZOLE 40 MG CAPSULE,DELAYED RELEASE
ORAL_CAPSULE | Freq: Every day | ORAL | 1 refills | 90 days | Status: CP
Start: 2022-04-11 — End: 2023-04-11

## 2022-04-11 MED ORDER — LOSARTAN 25 MG TABLET
ORAL_TABLET | Freq: Every day | ORAL | 1 refills | 90 days | Status: CP
Start: 2022-04-11 — End: 2023-04-11

## 2022-12-05 DIAGNOSIS — C3402 Malignant neoplasm of left main bronchus: Principal | ICD-10-CM

## 2022-12-09 ENCOUNTER — Emergency Department: Payer: Medicare HMO

## 2022-12-09 ENCOUNTER — Other Ambulatory Visit: Payer: Self-pay

## 2022-12-09 ENCOUNTER — Emergency Department
Admission: EM | Admit: 2022-12-09 | Discharge: 2022-12-09 | Disposition: A | Discharge status: Home / Self Care | Payer: Medicare HMO | Attending: Emergency Medicine | Admitting: Emergency Medicine

## 2022-12-09 DIAGNOSIS — Z8521 Personal history of malignant neoplasm of larynx: Secondary | ICD-10-CM | POA: Insufficient documentation

## 2022-12-09 DIAGNOSIS — J441 Chronic obstructive pulmonary disease with (acute) exacerbation: Secondary | ICD-10-CM | POA: Insufficient documentation

## 2022-12-09 DIAGNOSIS — I10 Essential (primary) hypertension: Secondary | ICD-10-CM | POA: Diagnosis not present

## 2022-12-09 DIAGNOSIS — W050XXA Fall from non-moving wheelchair, initial encounter: Secondary | ICD-10-CM | POA: Insufficient documentation

## 2022-12-09 DIAGNOSIS — R0689 Other abnormalities of breathing: Secondary | ICD-10-CM | POA: Diagnosis present

## 2022-12-09 LAB — CBC WITH DIFFERENTIAL/PLATELET
Abs Immature Granulocytes: 0.03 10*3/uL (ref 0.00–0.07)
Basophils Absolute: 0.1 10*3/uL (ref 0.0–0.1)
Basophils Relative: 2 %
Eosinophils Absolute: 0.4 10*3/uL (ref 0.0–0.5)
Eosinophils Relative: 8 %
HCT: 33.4 % — ABNORMAL LOW (ref 39.0–52.0)
Hemoglobin: 11.7 g/dL — ABNORMAL LOW (ref 13.0–17.0)
Immature Granulocytes: 1 %
Lymphocytes Relative: 12 %
Lymphs Abs: 0.6 10*3/uL — ABNORMAL LOW (ref 0.7–4.0)
MCH: 33.3 pg (ref 26.0–34.0)
MCHC: 35 g/dL (ref 30.0–36.0)
MCV: 95.2 fL (ref 80.0–100.0)
Monocytes Absolute: 0.4 10*3/uL (ref 0.1–1.0)
Monocytes Relative: 8 %
Neutro Abs: 3.9 10*3/uL (ref 1.7–7.7)
Neutrophils Relative %: 69 %
Platelets: 198 10*3/uL (ref 150–400)
RBC: 3.51 MIL/uL — ABNORMAL LOW (ref 4.22–5.81)
RDW: 15.1 % (ref 11.5–15.5)
WBC: 5.5 10*3/uL (ref 4.0–10.5)
nRBC: 0 % (ref 0.0–0.2)

## 2022-12-09 LAB — COMPREHENSIVE METABOLIC PANEL
ALT: 15 U/L (ref 0–44)
AST: 59 U/L — ABNORMAL HIGH (ref 15–41)
Albumin: 3.4 g/dL — ABNORMAL LOW (ref 3.5–5.0)
Alkaline Phosphatase: 61 U/L (ref 38–126)
Anion gap: 9 (ref 5–15)
BUN: 11 mg/dL (ref 8–23)
CO2: 24 mmol/L (ref 22–32)
Calcium: 8.4 mg/dL — ABNORMAL LOW (ref 8.9–10.3)
Chloride: 91 mmol/L — ABNORMAL LOW (ref 98–111)
Creatinine, Ser: 1.05 mg/dL (ref 0.61–1.24)
GFR, Estimated: >60 mL/min (ref >60)
Glucose, Bld: 61 mg/dL — ABNORMAL LOW (ref 70–99)
Potassium: 3.9 mmol/L (ref 3.5–5.1)
Sodium: 124 mmol/L — ABNORMAL LOW (ref 135–145)
Total Bilirubin: 0.7 mg/dL (ref <1.2)
Total Protein: 6.7 g/dL (ref 6.5–8.1)

## 2022-12-09 LAB — TROPONIN I (HIGH SENSITIVITY)
Troponin I (High Sensitivity): 15 ng/L (ref <18)
Troponin I (High Sensitivity): 14 ng/L (ref <18)

## 2022-12-09 LAB — BRAIN NATRIURETIC PEPTIDE: B Natriuretic Peptide: 149 pg/mL — ABNORMAL HIGH (ref 0.0–100.0)

## 2022-12-09 MED ORDER — AZITHROMYCIN 250 MG PO TABS: ORAL_TABLET | ORAL | 0 refills | Status: AC

## 2022-12-09 MED ORDER — IPRATROPIUM-ALBUTEROL 0.5-2.5 (3) MG/3ML IN SOLN
3.0000 mL | Freq: Once | RESPIRATORY_TRACT | Status: AC
  Administered 2022-12-09: 3 mL via RESPIRATORY_TRACT
  Filled 2022-12-09: qty 3

## 2022-12-09 MED ORDER — PREDNISONE 10 MG (21) PO TBPK: ORAL_TABLET | ORAL | 0 refills | Status: DC

## 2022-12-09 MED ORDER — METHYLPREDNISOLONE SODIUM SUCC 125 MG IJ SOLR
125.0000 mg | Freq: Once | INTRAMUSCULAR | Status: AC
Start: 2022-12-09 — End: 2022-12-09
  Administered 2022-12-09: 125 mg via INTRAVENOUS
  Filled 2022-12-09: qty 2

## 2022-12-09 MED ORDER — SODIUM CHLORIDE 0.9 % IV BOLUS
1000.0000 mL | Freq: Once | INTRAVENOUS | Status: AC
  Administered 2022-12-09: 1000 mL via INTRAVENOUS

## 2022-12-09 NOTE — ED Provider Notes (Signed)
[facility] Provider Note   Event Date/Time   First MD Initiated Contact with Patient 12/09/22 1457     (approximate) History  Fall  HPI Ronald Murphy is a 62 y.o. male with a past medical history of bronchial/laryngeal cancer, elevated THS, depression, and hypertension who presents after sliding out of his wheelchair.  Patient is currently on hospice through [facility].  Patient allegedly had a "fall" via sliding from his wheelchair onto the ground.  Patient denies any complaints at this time or pain.  Patient does seem to be in mild respiratory distress and was placed on 2 L on arrival.  Further history is unable to be obtained at this time as patient is minimally verbal. ROS: Unable to assess   Physical Exam  Triage Vital Signs: ED Triage Vitals  Encounter Vitals Group     BP 12/09/22 1443 113/75     Systolic BP Percentile --      Diastolic BP Percentile --      Pulse Rate 12/09/22 1443 62     Resp 12/09/22 1443 17     Temp 12/09/22 1443 97.7 F (36.5 C)     Temp Source 12/09/22 1443 Oral     SpO2 12/09/22 1443 (!) 88 %     Weight 12/09/22 1437 156 lb 8.4 oz (71 kg)     Height 12/09/22 1437 5\' 3"  (1.6 m)     Head Circumference --      Peak Flow --      Pain Score 12/09/22 1435 0     Pain Loc --      Pain Education --      Exclude from Growth Chart --    Most recent vital signs: Vitals:   12/09/22 1443  BP: 113/75  Pulse: 62  Resp: 17  Temp: 97.7 F (36.5 C)  SpO2: (!) 88%   General: Awake, oriented x4. CV:  Good peripheral perfusion.  Resp:  Increased effort.  Inspiratory expiratory wheezing over bilateral lung fields Abd:  No distention.  Other:  Middle-aged overweight Caucasian male resting comfortably in mild respiratory distress ED Results / Procedures / Treatments  Labs (all labs ordered are listed, but only abnormal results are displayed) Labs Reviewed  COMPREHENSIVE METABOLIC PANEL - Abnormal; Notable for the following  components:      Result Value   Sodium 124 (*)    Chloride 91 (*)    Glucose, Bld 61 (*)    Calcium 8.4 (*)    Albumin 3.4 (*)    AST 59 (*)    All other components within normal limits  CBC WITH DIFFERENTIAL/PLATELET - Abnormal; Notable for the following components:   RBC 3.51 (*)    Hemoglobin 11.7 (*)    HCT 33.4 (*)    Lymphs Abs 0.6 (*)    All other components within normal limits  BRAIN NATRIURETIC PEPTIDE - Abnormal; Notable for the following components:   B Natriuretic Peptide 149.0 (*)    All other components within normal limits  TROPONIN I (HIGH SENSITIVITY)  TROPONIN I (HIGH SENSITIVITY)   EKG ED ECG REPORT I, Merwyn Katos, the attending physician, personally viewed and interpreted this ECG. Date: 12/09/2022 EKG Time: 1443 Rate: 63 Rhythm: normal sinus rhythm QRS Axis: normal Intervals: normal ST/T Wave abnormalities: normal Narrative Interpretation: no evidence of acute ischemia RADIOLOGY ED MD interpretation: Single view portable chest x-ray shows a small left pleural effusion with adjacent opacity extending left retrocardiac region like  representing the lung carcinoma -Agree with radiology assessment Official radiology report(s): DG Chest Port 1 View  Result Date: 12/09/2022 CLINICAL DATA:  History of bronchial carcinoma with a hypoxia and increasing weakness EXAM: PORTABLE CHEST 1 VIEW COMPARISON:  X-ray 11/02/2020 FINDINGS: Small left pleural effusion with adjacent opacity extending left retrocardiac. No pneumothorax or edema. Right lung is grossly clear. Normal cardiopericardial silhouette. Calcified aorta. Overlapping cardiac leads. IMPRESSION: Small left effusion with adjacent opacity extending left retrocardiac. Infiltrate is possible. Recommend follow-up. In addition with the patient's history of lung carcinoma, a contrast CT may be of some benefit to further delineate. Electronically Signed   By: Karen Kays M.D.   On: 12/09/2022 16:00    PROCEDURES: Critical Care performed: No .1-3 Lead EKG Interpretation  Performed by: Merwyn Katos, MD Authorized by: Merwyn Katos, MD     Interpretation: normal     ECG rate:  71   ECG rate assessment: normal     Rhythm: sinus rhythm     Ectopy: none     Conduction: normal    MEDICATIONS ORDERED IN ED: Medications  ipratropium-albuterol (DUONEB) 0.5-2.5 (3) MG/3ML nebulizer solution 3 mL (3 mLs Nebulization Given 12/09/22 1535)  methylPREDNISolone sodium succinate (SOLU-MEDROL) 125 mg/2 mL injection 125 mg (125 mg Intravenous Given 12/09/22 1534)  sodium chloride 0.9 % bolus 1,000 mL (0 mLs Intravenous Stopped 12/09/22 1750)   IMPRESSION / MDM / ASSESSMENT AND PLAN / ED COURSE  I reviewed the triage vital signs and the nursing notes.                             The patient is on the cardiac monitor to evaluate for evidence of arrhythmia and/or significant heart rate changes. Patient's presentation is most consistent with acute presentation with potential threat to life or bodily function. The patient appears to be suffering from a moderate/severe exacerbation of COPD.  Based on the history, exam, CXR/EKG reviewed by me, and further workup I don't suspect any other emergent cause of this presentation, such as pneumonia, acute coronary syndrome, congestive heart failure, pulmonary embolism, or pneumothorax.  ED Interventions: bronchodilators, steroids, antibiotics, reassess  Reassessment: After treatment, the patient's shortness of breath is improving but patient is still requiring supplemental oxygenation   Disposition: Given the patient and her hospice care, [facility] care was contacted and will provide patient continuing treatment at home.   FINAL CLINICAL IMPRESSION(S) / ED DIAGNOSES   Final diagnoses:  COPD with acute exacerbation (HCC)   Rx / DC Orders   ED Discharge Orders          Ordered    predniSONE (STERAPRED UNI-PAK 21 TAB) 10 MG (21) TBPK tablet         12/09/22 1724    azithromycin (ZITHROMAX Z-PAK) 250 MG tablet        12/09/22 1724           Note:  This document was prepared using Dragon voice recognition software and may include unintentional dictation errors.   Merwyn Katos, MD 12/09/22 [phone]

## 2022-12-09 NOTE — ED Notes (Signed)
Spoke to [place] ( Daughter) she will be on her way

## 2022-12-09 NOTE — ED Notes (Signed)
Pt placed on 2L of oxygen BNC. Pt oxygen increased to 97%.

## 2022-12-09 NOTE — Progress Notes (Signed)
[facility] COLLECTIVE LIAISON NOTE  Received call from Dr. Sherryll Burger that current hospice patient was in the ED post fall at home.  Notified me that daughter was not aware patient went to hospital.  Ronald Murphy was also unaware.  Notified Dr. Sherryll Burger that patient could return home with hospice services from ED if family was in agreement.  Called and spoke with patient's daughter, Ronald Murphy, who is in agreement.  Patient to discharge back home this evening.  Report to hospice team and after hours staff.  [name], RN Nurse Liaison [phone]

## 2022-12-09 NOTE — ED Triage Notes (Signed)
Pt here via [facility] after a fall. Pt states he slid out of his chair. Pt not really talkative and poor skin turgor. Pt hypotensive on ems arrival, 86/50. Pt denies pain. Pt is on hospice but he does not have a DNR. Lower extremities swollen as well.    98.2 84-cbg 91% RA 17-co2 16 97/65 62

## 2022-12-14 ENCOUNTER — Other Ambulatory Visit: Payer: Self-pay

## 2022-12-14 ENCOUNTER — Emergency Department: Payer: Medicare HMO

## 2022-12-14 ENCOUNTER — Emergency Department
Admission: EM | Admit: 2022-12-14 | Discharge: 2022-12-14 | Disposition: A | Discharge status: Home / Self Care | Payer: Medicare HMO | Attending: Emergency Medicine | Admitting: Emergency Medicine

## 2022-12-14 DIAGNOSIS — J449 Chronic obstructive pulmonary disease, unspecified: Secondary | ICD-10-CM | POA: Diagnosis not present

## 2022-12-14 DIAGNOSIS — J189 Pneumonia, unspecified organism: Secondary | ICD-10-CM

## 2022-12-14 DIAGNOSIS — J181 Lobar pneumonia, unspecified organism: Secondary | ICD-10-CM | POA: Diagnosis not present

## 2022-12-14 DIAGNOSIS — J9601 Acute respiratory failure with hypoxia: Secondary | ICD-10-CM | POA: Diagnosis not present

## 2022-12-14 DIAGNOSIS — J168 Pneumonia due to other specified infectious organisms: Secondary | ICD-10-CM | POA: Insufficient documentation

## 2022-12-14 DIAGNOSIS — R059 Cough, unspecified: Secondary | ICD-10-CM | POA: Diagnosis present

## 2022-12-14 DIAGNOSIS — I10 Essential (primary) hypertension: Secondary | ICD-10-CM | POA: Insufficient documentation

## 2022-12-14 DIAGNOSIS — Z8521 Personal history of malignant neoplasm of larynx: Secondary | ICD-10-CM | POA: Insufficient documentation

## 2022-12-14 LAB — CBC
HCT: 36.7 % — ABNORMAL LOW (ref 39.0–52.0)
Hemoglobin: 12.5 g/dL — ABNORMAL LOW (ref 13.0–17.0)
MCH: 33.2 pg (ref 26.0–34.0)
MCHC: 34.1 g/dL (ref 30.0–36.0)
MCV: 97.3 fL (ref 80.0–100.0)
Platelets: 245 10*3/uL (ref 150–400)
RBC: 3.77 MIL/uL — ABNORMAL LOW (ref 4.22–5.81)
RDW: 15.7 % — ABNORMAL HIGH (ref 11.5–15.5)
WBC: 6.9 10*3/uL (ref 4.0–10.5)
nRBC: 0 % (ref 0.0–0.2)

## 2022-12-14 LAB — URINALYSIS, ROUTINE W REFLEX MICROSCOPIC
Bilirubin Urine: NEGATIVE
Glucose, UA: NEGATIVE mg/dL
Hgb urine dipstick: NEGATIVE
Ketones, ur: NEGATIVE mg/dL
Leukocytes,Ua: NEGATIVE
Nitrite: NEGATIVE
Protein, ur: NEGATIVE mg/dL
Specific Gravity, Urine: 1.013 (ref 1.005–1.030)
pH: 5 (ref 5.0–8.0)

## 2022-12-14 LAB — BASIC METABOLIC PANEL
Anion gap: 11 (ref 5–15)
BUN: 14 mg/dL (ref 8–23)
CO2: 25 mmol/L (ref 22–32)
Calcium: 8.3 mg/dL — ABNORMAL LOW (ref 8.9–10.3)
Chloride: 92 mmol/L — ABNORMAL LOW (ref 98–111)
Creatinine, Ser: 1.28 mg/dL — ABNORMAL HIGH (ref 0.61–1.24)
GFR, Estimated: >60 mL/min (ref >60)
Glucose, Bld: 92 mg/dL (ref 70–99)
Potassium: 4.9 mmol/L (ref 3.5–5.1)
Sodium: 128 mmol/L — ABNORMAL LOW (ref 135–145)

## 2022-12-14 MED ORDER — LEVOFLOXACIN 750 MG PO TABS: 750.0000 mg | ORAL_TABLET | Freq: Every day | ORAL | 0 refills | Status: DC

## 2022-12-14 MED ORDER — IPRATROPIUM-ALBUTEROL 0.5-2.5 (3) MG/3ML IN SOLN
6.0000 mL | Freq: Once | RESPIRATORY_TRACT | Status: AC
  Administered 2022-12-14: 6 mL via RESPIRATORY_TRACT
  Filled 2022-12-14: qty 3

## 2022-12-14 MED ORDER — SODIUM CHLORIDE 0.9 % IV SOLN
1.0000 g | Freq: Once | INTRAVENOUS | Status: AC
  Administered 2022-12-14: 1 g via INTRAVENOUS
  Filled 2022-12-14: qty 10

## 2022-12-14 MED ORDER — AZITHROMYCIN 500 MG IV SOLR
500.0000 mg | Freq: Once | INTRAVENOUS | Status: AC
  Administered 2022-12-14: 500 mg via INTRAVENOUS
  Filled 2022-12-14 (×2): qty 5

## 2022-12-14 MED ORDER — LEVOFLOXACIN 750 MG PO TABS: 750.0000 mg | ORAL_TABLET | Freq: Every day | ORAL | 0 refills | Status: AC

## 2022-12-14 NOTE — Progress Notes (Signed)
Received IV consult to restart PIV. Placed 20g 1.88" angio cath in left basilic vein with ultrasound. Prior to IV insertion noted skin tear to left lateral upper arm.

## 2022-12-14 NOTE — ED Provider Notes (Addendum)
[facility] Provider Note    Event Date/Time   First MD Initiated Contact with Patient 12/14/22 1231     (approximate)   History   Seizures   HPI  Ronald Murphy is a 62 y.o. male   Past medical history of advanced laryngeal cancer, depression, hypertension, on hospice [facility] was recently seen in the emergency department 12/09/2022 diagnosed with COPD exacerbation pneumonia who Ronald Murphy presents due to shaking activity as his son's girlfriend called EMS.  The patient is nonverbal.  He is able to nod his head yes when I ask if he is having difficulty breathing and cough.  He is hypoxemic requiring nonrebreather.  He has rhonchi throughout all lung fields and sputum from his stoma on his neck.  I spoke with his daughter Ronald Murphy over the telephone regarding goals of care -she states that the patient is indeed on home hospice lives with his wife and son but his wife recently passed away.  Ronald Murphy is the power of attorney, and is coming to the hospital.  I informed her of the patient's current status, concern for respiratory infection and the potential for this illness to lead to his death discussed care options including comfort care at home versus hospitalization.  She does agree DNR/DNI and would like to opt for limited interventions for him to go home to the comfort of his own home, but agrees for a dose of IV antibiotics and respiratory care in the emergency department for suctioning.  I also spoke with Ronald Murphy who is the nurse liaison on for his hospice care who agreed with care plan, we will reach out to daughter and coordinate home visits.  Independent Historian contributed to assessment above: Both daughter/POA Ronald Murphy and Ronald Murphy of [facility] as above      Physical Exam   Triage Vital Signs: ED Triage Vitals  Encounter Vitals Group     BP 12/14/22 1220 (!) 127/110     Systolic BP Percentile --      Diastolic BP Percentile --      Pulse --      Resp --       Temp 12/14/22 1220 97.9 F (36.6 C)     Temp Source 12/14/22 1220 Oral     SpO2 12/14/22 1222 (!) 76 %     Weight --      Height --      Head Circumference --      Peak Flow --      Pain Score --      Pain Loc --      Pain Education --      Exclude from Growth Chart --     Most recent vital signs: Vitals:   12/14/22 1415 12/14/22 1430  BP:    Pulse:    Resp: 15 15  Temp:    SpO2:      General: Awake, no distress.  CV:  Good peripheral perfusion.  Resp:  Normal effort.  Abd:  No distention.  Other:  Ill-appearing on nonrebreather.  Rhonchi in all lung fields.  Sputum coming from stoma, nonverbal at baseline.   ED Results / Procedures / Treatments   Labs (all labs ordered are listed, but only abnormal results are displayed) Labs Reviewed  BASIC METABOLIC PANEL - Abnormal; Notable for the following components:      Result Value   Sodium 128 (*)    Chloride 92 (*)    Creatinine, Ser 1.28 (*)    Calcium  8.3 (*)    All other components within normal limits  CBC - Abnormal; Notable for the following components:   RBC 3.77 (*)    Hemoglobin 12.5 (*)    HCT 36.7 (*)    RDW 15.7 (*)    All other components within normal limits  URINALYSIS, ROUTINE W REFLEX MICROSCOPIC - Abnormal; Notable for the following components:   Color, Urine YELLOW (*)    APPearance CLEAR (*)    All other components within normal limits     I ordered and reviewed the above labs they are notable for natremia 128 though improved from last set of labs obtained 5 days ago, white blood cell count is normal.  EKG  ED ECG REPORT I, Ronald Murphy, the attending physician, personally viewed and interpreted this ECG.   Date: 12/14/2022  EKG Time: 1221  Rate: 98  Rhythm: sinus  Axis: nl  Intervals:none  ST&T Change: no stemi    RADIOLOGY I independently reviewed and interpreted chest x-ray and I see left lower lung opacities concerning for pneumonia I also reviewed radiologist's formal  read.   PROCEDURES:  Critical Care performed: Yes, see critical care procedure note(s)  Procedures   MEDICATIONS ORDERED IN ED: Medications  cefTRIAXone (ROCEPHIN) 1 g in sodium chloride 0.9 % 100 mL IVPB (0 g Intravenous Stopped 12/14/22 1428)  azithromycin (ZITHROMAX) 500 mg in sodium chloride 0.9 % 250 mL IVPB (500 mg Intravenous New Bag/Given 12/14/22 1328)  ipratropium-albuterol (DUONEB) 0.5-2.5 (3) MG/3ML nebulizer solution 6 mL (6 mLs Nebulization Given 12/14/22 1428)    External physician / consultants:  I spoke with [facility] Nurse Liaison Ronald Murphy regarding care plan for this patient.   IMPRESSION / MDM / ASSESSMENT AND PLAN / ED COURSE  I reviewed the triage vital signs and the nursing notes.                                Patient's presentation is most consistent with acute presentation with potential threat to life or bodily function.  Differential diagnosis includes, but is not limited to, acute hypoxemic respiratory failure, pneumonia, COPD exacerbation   The patient is on the cardiac monitor to evaluate for evidence of arrhythmia and/or significant heart rate changes.  MDM:    This is a patient on hospice care, with pneumonia on chest x-ray, evidence of COPD exacerbation and acute hypoxemic respiratory failure.  Goals of care discussion had with his daughter as well as hospice liaison nurse, opting for limited hospital interventions but would like IV dose of antibiotic, breathing treatment, respiratory suctioning for comfort.  Given his hypoxemic respiratory failure and chronic health conditions I discussed the possibility that he may die from this acute illness.  Daughter opting towards discharging home rather than hospitalization at this time.  Hospice care will arrange for close follow-up at home.   -- Respiratory care team has suctioned the patient and is now 100% on room air breathing much more comfortably.  I spoke with the family members at bedside and the  patient who would like to go home at this time to continue treatment.  They will reach out to the hospice care team for further evaluation and follow-up.       FINAL CLINICAL IMPRESSION(S) / ED DIAGNOSES   Final diagnoses:  Pneumonia of left lower lobe due to infectious organism  Acute hypoxemic respiratory failure (HCC)     Rx / DC Orders  ED Discharge Orders          Ordered    levofloxacin (LEVAQUIN) 750 MG tablet  Daily,   Status:  Discontinued        12/14/22 1407    levofloxacin (LEVAQUIN) 750 MG tablet  Daily        12/14/22 1546             Note:  This document was prepared using Dragon voice recognition software and may include unintentional dictation errors.    Ronald Jarvis, MD 12/14/22 1320    Ronald Jarvis, MD 12/14/22 [phone]

## 2022-12-14 NOTE — ED Notes (Signed)
Patient discharged from ED by provider. Discharge instructions provided to patient and family. All questions answered. Patient wheeled from ED via stretcher in NAD.

## 2022-12-14 NOTE — Progress Notes (Signed)
[facility] Hospitalized Hospice Patient  Follow up on current hospice patient followed at home for terminal diagnosis of cancer of supraglottis area.  Patient was sent to ED via EMS by family and did not notify our agency.   Spoke with Dr. Modesto Charon, ED physician, regarding current situation.  Plan is for patient to d/c home from ED today after treatment.  He spoke with [name], HCPOA.  This is 2nd trip to ED this week that she was not aware patient was transferring.  Patient lives at home with his wife and son.  Patient lost his wife this week.   [name], daughter, would like for patient to receive treatment in the ED and then plan for discharge home.  I have notified patient's hospice team and request them to have continued goals of care with patient's daughter, [name] and with patient's son- who helps care for him in the home- however- suffers from a TBI.  DC Plan- home with hospice after antibiotic and respiratory treatments.  [name], RN Nurse Liaison [phone]

## 2022-12-14 NOTE — ED Triage Notes (Addendum)
First nurse note: Pt to ED via [facility] from home. EMS was called for seizure with "shaking" like activity. EMS reports 6 empty beer cans at bedside. EMS states wheezing bilaterally. 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 Pt is on hospice care. Pt has stoma that is leaking mucous and hard and crusty around it. EMS states unable to get pulse ox due to equipment not working. Pt points to legs when asked if he is in pain.   EMS VS 106/68 CBG 105 HR 100 98.5 oral

## 2022-12-14 NOTE — Discharge Instructions (Addendum)
Take antibiotics for the full course as prescribed.  Please be in touch with hospice care team for follow-up appointments in the home. [name], RN Nurse Liaison [phone]

## 2022-12-21 DIAGNOSIS — C3402 Malignant neoplasm of left main bronchus: Principal | ICD-10-CM

## 2023-01-02 DIAGNOSIS — C3402 Malignant neoplasm of left main bronchus: Principal | ICD-10-CM

## 2023-01-30 DIAGNOSIS — C3402 Malignant neoplasm of left main bronchus: Principal | ICD-10-CM

## 2023-02-12 DIAGNOSIS — C3402 Malignant neoplasm of left main bronchus: Principal | ICD-10-CM

## 2023-02-26 DIAGNOSIS — C3402 Malignant neoplasm of left main bronchus: Principal | ICD-10-CM

## 2023-04-15 DEATH — deceased
